# Patient Record
Sex: Female | Born: 1935 | Race: White | Hispanic: No | Marital: Married | Smoking: Never smoker
Health system: Southern US, Community
[De-identification: ages and names within clinical notes are randomized; demographics above are authoritative.]

## PROBLEM LIST (undated history)

## (undated) DIAGNOSIS — G35 Multiple sclerosis: Secondary | ICD-10-CM

## (undated) DIAGNOSIS — I10 Essential (primary) hypertension: Secondary | ICD-10-CM

## (undated) DIAGNOSIS — K4090 Unilateral inguinal hernia, without obstruction or gangrene, not specified as recurrent: Secondary | ICD-10-CM

## (undated) DIAGNOSIS — K5792 Diverticulitis of intestine, part unspecified, without perforation or abscess without bleeding: Secondary | ICD-10-CM

## (undated) DIAGNOSIS — E119 Type 2 diabetes mellitus without complications: Secondary | ICD-10-CM

## (undated) HISTORY — PX: ABDOMINAL HYSTERECTOMY: SHX81

## (undated) HISTORY — PX: CHOLECYSTECTOMY: SHX55

## (undated) HISTORY — PX: CERVICAL SPINE SURGERY: SHX589

## (undated) HISTORY — PX: REPLACEMENT TOTAL KNEE: SUR1224

## (undated) HISTORY — PX: HERNIA REPAIR: SHX51

## (undated) HISTORY — PX: APPENDECTOMY: SHX54

## (undated) HISTORY — PX: ORIF ANKLE FRACTURE: SUR919

---

## 1997-05-03 ENCOUNTER — Ambulatory Visit (HOSPITAL_COMMUNITY): Admission: RE | Admit: 1997-05-03 | Discharge: 1997-05-03 | Payer: Self-pay | Admitting: *Deleted

## 1998-06-07 ENCOUNTER — Ambulatory Visit (HOSPITAL_COMMUNITY): Admission: RE | Admit: 1998-06-07 | Discharge: 1998-06-07 | Payer: Self-pay | Admitting: *Deleted

## 1998-06-07 ENCOUNTER — Encounter: Payer: Self-pay | Admitting: Internal Medicine

## 1999-08-23 ENCOUNTER — Ambulatory Visit (HOSPITAL_COMMUNITY): Admission: RE | Admit: 1999-08-23 | Discharge: 1999-08-23 | Payer: Self-pay | Admitting: *Deleted

## 1999-08-23 ENCOUNTER — Encounter: Payer: Self-pay | Admitting: Internal Medicine

## 2000-02-11 ENCOUNTER — Inpatient Hospital Stay (HOSPITAL_COMMUNITY)
Admission: RE | Admit: 2000-02-11 | Discharge: 2000-02-28 | Payer: Self-pay | Admitting: Physical Medicine and Rehabilitation

## 2000-02-26 ENCOUNTER — Encounter: Payer: Self-pay | Admitting: Physical Medicine and Rehabilitation

## 2000-09-02 ENCOUNTER — Encounter: Payer: Self-pay | Admitting: Internal Medicine

## 2000-09-02 ENCOUNTER — Ambulatory Visit (HOSPITAL_COMMUNITY): Admission: RE | Admit: 2000-09-02 | Discharge: 2000-09-02 | Payer: Self-pay | Admitting: *Deleted

## 2002-05-17 ENCOUNTER — Ambulatory Visit (HOSPITAL_COMMUNITY): Admission: RE | Admit: 2002-05-17 | Discharge: 2002-05-17 | Payer: Self-pay | Admitting: Internal Medicine

## 2002-05-17 ENCOUNTER — Encounter: Payer: Self-pay | Admitting: Internal Medicine

## 2003-09-14 ENCOUNTER — Emergency Department (HOSPITAL_COMMUNITY): Admission: EM | Admit: 2003-09-14 | Discharge: 2003-09-14 | Payer: Self-pay | Admitting: *Deleted

## 2003-09-14 ENCOUNTER — Ambulatory Visit (HOSPITAL_COMMUNITY): Admission: RE | Admit: 2003-09-14 | Discharge: 2003-09-14 | Payer: Self-pay | Admitting: Internal Medicine

## 2004-10-08 ENCOUNTER — Ambulatory Visit: Payer: Self-pay | Admitting: Internal Medicine

## 2005-10-10 ENCOUNTER — Ambulatory Visit: Payer: Self-pay | Admitting: Internal Medicine

## 2006-04-11 ENCOUNTER — Inpatient Hospital Stay: Payer: Self-pay | Admitting: General Practice

## 2006-04-11 ENCOUNTER — Other Ambulatory Visit: Payer: Self-pay

## 2006-04-15 ENCOUNTER — Encounter: Payer: Self-pay | Admitting: Internal Medicine

## 2006-05-15 ENCOUNTER — Encounter: Payer: Self-pay | Admitting: Internal Medicine

## 2006-06-12 ENCOUNTER — Ambulatory Visit: Payer: Self-pay | Admitting: Nurse Practitioner

## 2006-10-13 ENCOUNTER — Ambulatory Visit: Payer: Self-pay | Admitting: Family Medicine

## 2006-11-13 ENCOUNTER — Encounter: Payer: Self-pay | Admitting: Internal Medicine

## 2006-11-15 ENCOUNTER — Encounter: Payer: Self-pay | Admitting: Internal Medicine

## 2006-12-16 ENCOUNTER — Ambulatory Visit: Payer: Self-pay | Admitting: Unknown Physician Specialty

## 2007-06-18 ENCOUNTER — Ambulatory Visit: Payer: Self-pay | Admitting: Internal Medicine

## 2008-09-26 ENCOUNTER — Ambulatory Visit: Payer: Self-pay | Admitting: Unknown Physician Specialty

## 2008-10-06 ENCOUNTER — Ambulatory Visit: Payer: Self-pay | Admitting: Family Medicine

## 2009-05-10 ENCOUNTER — Ambulatory Visit: Payer: Self-pay | Admitting: Ophthalmology

## 2009-09-06 ENCOUNTER — Ambulatory Visit: Payer: Self-pay | Admitting: Ophthalmology

## 2009-09-20 ENCOUNTER — Ambulatory Visit: Payer: Self-pay | Admitting: Internal Medicine

## 2009-10-04 ENCOUNTER — Ambulatory Visit: Payer: Self-pay | Admitting: Internal Medicine

## 2009-10-18 ENCOUNTER — Ambulatory Visit: Payer: Self-pay | Admitting: Internal Medicine

## 2009-10-27 ENCOUNTER — Ambulatory Visit: Payer: Self-pay | Admitting: Internal Medicine

## 2009-10-31 ENCOUNTER — Ambulatory Visit: Payer: Self-pay | Admitting: Internal Medicine

## 2009-11-07 ENCOUNTER — Ambulatory Visit: Payer: Self-pay | Admitting: Internal Medicine

## 2009-11-14 ENCOUNTER — Ambulatory Visit: Payer: Self-pay | Admitting: Internal Medicine

## 2009-11-23 ENCOUNTER — Ambulatory Visit: Payer: Self-pay | Admitting: Internal Medicine

## 2010-02-06 ENCOUNTER — Ambulatory Visit: Payer: Self-pay | Admitting: Family Medicine

## 2010-04-06 ENCOUNTER — Ambulatory Visit: Payer: Self-pay | Admitting: Unknown Physician Specialty

## 2010-05-07 ENCOUNTER — Ambulatory Visit: Payer: Self-pay | Admitting: Unknown Physician Specialty

## 2010-06-04 ENCOUNTER — Ambulatory Visit: Payer: Self-pay | Admitting: Otolaryngology

## 2010-08-13 ENCOUNTER — Ambulatory Visit: Payer: Self-pay | Admitting: Otolaryngology

## 2010-09-26 ENCOUNTER — Ambulatory Visit: Payer: Self-pay | Admitting: Family Medicine

## 2010-11-14 ENCOUNTER — Ambulatory Visit: Payer: Self-pay

## 2013-09-16 ENCOUNTER — Ambulatory Visit: Payer: Self-pay | Admitting: Family Medicine

## 2013-09-29 ENCOUNTER — Ambulatory Visit: Payer: Self-pay | Admitting: Family Medicine

## 2014-02-24 ENCOUNTER — Ambulatory Visit: Payer: Self-pay | Admitting: Emergency Medicine

## 2014-07-01 ENCOUNTER — Encounter: Payer: Self-pay | Admitting: Emergency Medicine

## 2014-07-01 ENCOUNTER — Emergency Department: Payer: Medicare Other

## 2014-07-01 ENCOUNTER — Inpatient Hospital Stay
Admission: EM | Admit: 2014-07-01 | Discharge: 2014-07-04 | DRG: 640 | Disposition: A | Discharge status: Home / Self Care | Payer: Medicare Other | Attending: Internal Medicine | Admitting: Internal Medicine

## 2014-07-01 DIAGNOSIS — I10 Essential (primary) hypertension: Secondary | ICD-10-CM | POA: Diagnosis present

## 2014-07-01 DIAGNOSIS — Z7982 Long term (current) use of aspirin: Secondary | ICD-10-CM | POA: Diagnosis not present

## 2014-07-01 DIAGNOSIS — Z96652 Presence of left artificial knee joint: Secondary | ICD-10-CM | POA: Diagnosis present

## 2014-07-01 DIAGNOSIS — D649 Anemia, unspecified: Secondary | ICD-10-CM | POA: Diagnosis present

## 2014-07-01 DIAGNOSIS — R270 Ataxia, unspecified: Secondary | ICD-10-CM

## 2014-07-01 DIAGNOSIS — G35 Multiple sclerosis: Secondary | ICD-10-CM | POA: Diagnosis present

## 2014-07-01 DIAGNOSIS — E1140 Type 2 diabetes mellitus with diabetic neuropathy, unspecified: Secondary | ICD-10-CM | POA: Diagnosis present

## 2014-07-01 DIAGNOSIS — G894 Chronic pain syndrome: Secondary | ICD-10-CM | POA: Diagnosis present

## 2014-07-01 DIAGNOSIS — E876 Hypokalemia: Secondary | ICD-10-CM | POA: Diagnosis present

## 2014-07-01 DIAGNOSIS — E871 Hypo-osmolality and hyponatremia: Principal | ICD-10-CM | POA: Diagnosis present

## 2014-07-01 DIAGNOSIS — R531 Weakness: Secondary | ICD-10-CM

## 2014-07-01 DIAGNOSIS — K219 Gastro-esophageal reflux disease without esophagitis: Secondary | ICD-10-CM | POA: Diagnosis present

## 2014-07-01 DIAGNOSIS — G9341 Metabolic encephalopathy: Secondary | ICD-10-CM | POA: Diagnosis present

## 2014-07-01 HISTORY — DX: Essential (primary) hypertension: I10

## 2014-07-01 HISTORY — DX: Multiple sclerosis: G35

## 2014-07-01 HISTORY — DX: Unilateral inguinal hernia, without obstruction or gangrene, not specified as recurrent: K40.90

## 2014-07-01 HISTORY — DX: Type 2 diabetes mellitus without complications: E11.9

## 2014-07-01 HISTORY — DX: Diverticulitis of intestine, part unspecified, without perforation or abscess without bleeding: K57.92

## 2014-07-01 LAB — CBC
HEMATOCRIT: 29.4 % — AB (ref 35.0–47.0)
Hemoglobin: 10.2 g/dL — ABNORMAL LOW (ref 12.0–16.0)
MCH: 33.3 pg (ref 26.0–34.0)
MCHC: 34.7 g/dL (ref 32.0–36.0)
MCV: 96.1 fL (ref 80.0–100.0)
Platelets: 259 10*3/uL (ref 150–440)
RBC: 3.06 MIL/uL — ABNORMAL LOW (ref 3.80–5.20)
RDW: 15.2 % — AB (ref 11.5–14.5)
WBC: 4.5 10*3/uL (ref 3.6–11.0)

## 2014-07-01 LAB — COMPREHENSIVE METABOLIC PANEL
ALT: 23 U/L (ref 14–54)
AST: 36 U/L (ref 15–41)
Albumin: 3.2 g/dL — ABNORMAL LOW (ref 3.5–5.0)
Alkaline Phosphatase: 76 U/L (ref 38–126)
Anion gap: 11 (ref 5–15)
BILIRUBIN TOTAL: 0.2 mg/dL — AB (ref 0.3–1.2)
BUN: 12 mg/dL (ref 6–20)
CALCIUM: 8.8 mg/dL — AB (ref 8.9–10.3)
CO2: 26 mmol/L (ref 22–32)
Chloride: 85 mmol/L — ABNORMAL LOW (ref 101–111)
Creatinine, Ser: 0.85 mg/dL (ref 0.44–1.00)
GLUCOSE: 135 mg/dL — AB (ref 65–99)
Potassium: 3.4 mmol/L — ABNORMAL LOW (ref 3.5–5.1)
SODIUM: 122 mmol/L — AB (ref 135–145)
Total Protein: 8.6 g/dL — ABNORMAL HIGH (ref 6.5–8.1)

## 2014-07-01 LAB — URINALYSIS COMPLETE WITH MICROSCOPIC (ARMC ONLY)
BACTERIA UA: NONE SEEN
Bilirubin Urine: NEGATIVE
GLUCOSE, UA: NEGATIVE mg/dL
Hgb urine dipstick: NEGATIVE
Ketones, ur: NEGATIVE mg/dL
Leukocytes, UA: NEGATIVE
Nitrite: NEGATIVE
Protein, ur: NEGATIVE mg/dL
SPECIFIC GRAVITY, URINE: 1.011 (ref 1.005–1.030)
pH: 7 (ref 5.0–8.0)

## 2014-07-01 LAB — GLUCOSE, CAPILLARY: Glucose-Capillary: 92 mg/dL (ref 65–99)

## 2014-07-01 MED ORDER — FLUTICASONE PROPIONATE 50 MCG/ACT NA SUSP
1.0000 | Freq: Every day | NASAL | Status: DC
  Filled 2014-07-01: qty 16

## 2014-07-01 MED ORDER — FLUTICASONE PROPIONATE 50 MCG/ACT NA SUSP
1.0000 | Freq: Every day | NASAL | Status: DC
  Administered 2014-07-01 – 2014-07-04 (×4): 1 via NASAL
  Filled 2014-07-01: qty 16

## 2014-07-01 MED ORDER — VITAMIN D 1000 UNITS PO TABS
4000.0000 [IU] | ORAL_TABLET | Freq: Every day | ORAL | Status: DC
  Administered 2014-07-01 – 2014-07-02 (×2): 4000 [IU] via ORAL
  Filled 2014-07-01 (×3): qty 4

## 2014-07-01 MED ORDER — DONEPEZIL HCL 5 MG PO TABS
10.0000 mg | ORAL_TABLET | Freq: Every day | ORAL | Status: DC
  Administered 2014-07-01 – 2014-07-03 (×3): 10 mg via ORAL
  Filled 2014-07-01 (×3): qty 2

## 2014-07-01 MED ORDER — BENAZEPRIL HCL 20 MG PO TABS
40.0000 mg | ORAL_TABLET | Freq: Every day | ORAL | Status: DC
  Administered 2014-07-02 – 2014-07-04 (×3): 40 mg via ORAL
  Filled 2014-07-01 (×2): qty 1
  Filled 2014-07-01: qty 2
  Filled 2014-07-01 (×2): qty 1
  Filled 2014-07-01: qty 2

## 2014-07-01 MED ORDER — METHADONE HCL 10 MG PO TABS
5.0000 mg | ORAL_TABLET | Freq: Four times a day (QID) | ORAL | Status: DC
  Administered 2014-07-01 – 2014-07-03 (×8): 5 mg via ORAL
  Filled 2014-07-01 (×8): qty 1

## 2014-07-01 MED ORDER — ENOXAPARIN SODIUM 40 MG/0.4ML ~~LOC~~ SOLN
40.0000 mg | SUBCUTANEOUS | Status: DC
  Administered 2014-07-02 – 2014-07-03 (×2): 40 mg via SUBCUTANEOUS
  Filled 2014-07-01 (×2): qty 0.4

## 2014-07-01 MED ORDER — TRAMADOL HCL 50 MG PO TABS
100.0000 mg | ORAL_TABLET | Freq: Two times a day (BID) | ORAL | Status: DC
  Administered 2014-07-01 – 2014-07-04 (×6): 100 mg via ORAL
  Filled 2014-07-01 (×6): qty 2

## 2014-07-01 MED ORDER — DICYCLOMINE HCL 10 MG PO CAPS
10.0000 mg | ORAL_CAPSULE | Freq: Two times a day (BID) | ORAL | Status: DC
  Administered 2014-07-01 – 2014-07-04 (×6): 10 mg via ORAL
  Filled 2014-07-01 (×6): qty 1

## 2014-07-01 MED ORDER — SODIUM CHLORIDE 0.9 % IJ SOLN
3.0000 mL | Freq: Two times a day (BID) | INTRAMUSCULAR | Status: DC
  Administered 2014-07-01: 3 mL via INTRAVENOUS

## 2014-07-01 MED ORDER — OXCARBAZEPINE 300 MG PO TABS
300.0000 mg | ORAL_TABLET | Freq: Two times a day (BID) | ORAL | Status: DC
  Administered 2014-07-01 – 2014-07-04 (×6): 300 mg via ORAL
  Filled 2014-07-01 (×7): qty 1

## 2014-07-01 MED ORDER — RISAQUAD PO CAPS
1.0000 | ORAL_CAPSULE | Freq: Every day | ORAL | Status: DC
  Administered 2014-07-02 – 2014-07-04 (×3): 1 via ORAL
  Filled 2014-07-01 (×4): qty 1

## 2014-07-01 MED ORDER — PANTOPRAZOLE SODIUM 40 MG PO TBEC
40.0000 mg | DELAYED_RELEASE_TABLET | Freq: Every day | ORAL | Status: DC
  Administered 2014-07-01 – 2014-07-04 (×4): 40 mg via ORAL
  Filled 2014-07-01 (×4): qty 1

## 2014-07-01 MED ORDER — INSULIN ASPART 100 UNIT/ML ~~LOC~~ SOLN
0.0000 [IU] | Freq: Three times a day (TID) | SUBCUTANEOUS | Status: DC
  Administered 2014-07-02: 2 [IU] via SUBCUTANEOUS
  Administered 2014-07-02: 1 [IU] via SUBCUTANEOUS
  Filled 2014-07-01: qty 2
  Filled 2014-07-01: qty 1
  Filled 2014-07-01: qty 2

## 2014-07-01 MED ORDER — SODIUM CHLORIDE 0.9 % IV BOLUS (SEPSIS)
1000.0000 mL | Freq: Once | INTRAVENOUS | Status: AC
  Administered 2014-07-01: 1000 mL via INTRAVENOUS

## 2014-07-01 MED ORDER — ACETAMINOPHEN 325 MG PO TABS
650.0000 mg | ORAL_TABLET | Freq: Four times a day (QID) | ORAL | Status: DC | PRN
  Administered 2014-07-03: 650 mg via ORAL
  Filled 2014-07-01: qty 2

## 2014-07-01 MED ORDER — DOCUSATE SODIUM 100 MG PO CAPS
100.0000 mg | ORAL_CAPSULE | Freq: Two times a day (BID) | ORAL | Status: DC | PRN
  Administered 2014-07-03: 200 mg via ORAL
  Filled 2014-07-01: qty 2

## 2014-07-01 MED ORDER — PRIMIDONE 50 MG PO TABS
50.0000 mg | ORAL_TABLET | Freq: Three times a day (TID) | ORAL | Status: DC | PRN
  Filled 2014-07-01: qty 1

## 2014-07-01 MED ORDER — VITAMIN C 500 MG PO TABS
500.0000 mg | ORAL_TABLET | Freq: Every day | ORAL | Status: DC
  Administered 2014-07-01 – 2014-07-03 (×3): 500 mg via ORAL
  Filled 2014-07-01 (×3): qty 1

## 2014-07-01 MED ORDER — GABAPENTIN 300 MG PO CAPS
300.0000 mg | ORAL_CAPSULE | Freq: Every day | ORAL | Status: DC
  Administered 2014-07-01 – 2014-07-03 (×7): 300 mg via ORAL
  Filled 2014-07-01 (×7): qty 1

## 2014-07-01 MED ORDER — TRAMADOL HCL 50 MG PO TABS: 200.0000 mg | ORAL_TABLET | Freq: Two times a day (BID) | ORAL | Status: DC

## 2014-07-01 MED ORDER — DIPHENHYDRAMINE HCL 25 MG PO CAPS
50.0000 mg | ORAL_CAPSULE | Freq: Every evening | ORAL | Status: DC | PRN
  Administered 2014-07-01 – 2014-07-03 (×3): 50 mg via ORAL
  Filled 2014-07-01 (×3): qty 2

## 2014-07-01 MED ORDER — SODIUM CHLORIDE 0.9 % IV SOLN
INTRAVENOUS | Status: DC
  Administered 2014-07-01 – 2014-07-04 (×5): via INTRAVENOUS

## 2014-07-01 MED ORDER — GLIMEPIRIDE 2 MG PO TABS
1.0000 mg | ORAL_TABLET | Freq: Every day | ORAL | Status: DC
  Administered 2014-07-02 – 2014-07-03 (×2): 1 mg via ORAL
  Administered 2014-07-04: 2 mg via ORAL
  Filled 2014-07-01 (×3): qty 1

## 2014-07-01 MED ORDER — ASPIRIN EC 81 MG PO TBEC
81.0000 mg | DELAYED_RELEASE_TABLET | Freq: Every day | ORAL | Status: DC
  Administered 2014-07-01 – 2014-07-03 (×3): 81 mg via ORAL
  Filled 2014-07-01 (×3): qty 1

## 2014-07-01 MED ORDER — ACETAMINOPHEN 650 MG RE SUPP: 650.0000 mg | Freq: Four times a day (QID) | RECTAL | Status: DC | PRN

## 2014-07-01 MED ORDER — CALCIUM CARBONATE ANTACID 500 MG PO CHEW
500.0000 mg | CHEWABLE_TABLET | Freq: Every day | ORAL | Status: DC
  Administered 2014-07-01 – 2014-07-03 (×3): 500 mg via ORAL
  Filled 2014-07-01 (×6): qty 1

## 2014-07-01 NOTE — ED Provider Notes (Signed)
[facility] Emergency Department Provider Note  ____________________________________________  Time seen: 4:25 PM  I have reviewed the triage vital signs and the nursing notes.   HISTORY  Chief Complaint Altered Mental Status  history obtained from patient and family at bedside   HPI Stefanie Pham is a 81 y.o. female is complaining of confusion and generalized weakness with difficulty walking and unsteadiness the past 24 hours. This is gradual in onset. The patient denies headache and change chest pain shortness of breath abdominal pain nausea vomiting or diarrhea. She was recently treated for UTI with Keflex which was then switched to Macrobid. No fevers or chills or other acute symptoms     Past Medical History  Diagnosis Date  . Hypertension   . Multiple sclerosis   . Diabetes mellitus without complication   . Hernia, inguinal   . Diverticulitis     There are no active problems to display for this patient.   Past Surgical History  Procedure Laterality Date  . Cholecystectomy    . Appendectomy      Current Outpatient Rx  Name  Route  Sig  Dispense  Refill  . benazepril (LOTENSIN) 40 MG tablet   Oral   Take 40 mg by mouth daily.         Marland Kitchen dicyclomine (BENTYL) 10 MG capsule   Oral   Take 10 mg by mouth 2 (two) times daily.         Marland Kitchen donepezil (ARICEPT) 10 MG tablet   Oral   Take 10 mg by mouth at bedtime.         . gabapentin (NEURONTIN) 300 MG capsule   Oral   Take 300 mg by mouth 5 (five) times daily.         Marland Kitchen glimepiride (AMARYL) 1 MG tablet   Oral   Take 1 mg by mouth daily. If fasting blood sugar is over 120 then pt takes two tablets.         . hydrochlorothiazide (HYDRODIURIL) 25 MG tablet   Oral   Take 25 mg by mouth daily.         . methadone (DOLOPHINE) 5 MG tablet   Oral   Take 5 mg by mouth every 6 (six) hours.         Marland Kitchen omeprazole (PRILOSEC) 20 MG capsule   Oral   Take 20 mg by mouth daily.          . Oxcarbazepine (TRILEPTAL) 300 MG tablet   Oral   Take 300 mg by mouth 2 (two) times daily.         . primidone (MYSOLINE) 50 MG tablet   Oral   Take 50 mg by mouth 3 (three) times daily as needed (for tremors).         . Probiotic CAPS   Oral   Take 1 capsule by mouth daily.         . traMADol (ULTRAM-ER) 100 MG 24 hr tablet   Oral   Take 100 mg by mouth 2 (two) times daily.           Allergies Morphine and related and Codeine  History reviewed. No pertinent family history.  Social History History  Substance Use Topics  . Smoking status: Never Smoker   . Smokeless tobacco: Not on file  . Alcohol Use: No    Review of Systems  Constitutional: No fever or chills. No weight changes Eyes:No blurry vision or double vision.  ENT:  No sore throat. Cardiovascular: No chest pain. Respiratory: No dyspnea or cough. Gastrointestinal: Negative for abdominal pain, vomiting and diarrhea.  No BRBPR or melena. Genitourinary: Negative for dysuria, urinary retention, bloody urine, or difficulty urinating. Musculoskeletal: Negative for back pain. No joint swelling or pain. Skin: Negative for rash. Neurological: Diminished balance and coordination, generalized weakness. Psychiatric:No anxiety or depression.   Endocrine:No hot/cold intolerance, changes in energy, or sleep difficulty.  10-point ROS otherwise negative.  ____________________________________________   PHYSICAL EXAM:  VITAL SIGNS: ED Triage Vitals  Enc Vitals Group     BP 07/01/14 1335 136/66 mmHg     Pulse Rate 07/01/14 1335 68     Resp 07/01/14 1335 18     Temp 07/01/14 1335 98.8 F (37.1 C)     Temp Source 07/01/14 1335 Oral     SpO2 07/01/14 1335 99 %     Weight 07/01/14 1335 220 lb (99.791 kg)     Height 07/01/14 1335 5\' 7"  (1.702 m)     Head Cir --      Peak Flow --      Pain Score --      Pain Loc --      Pain Edu? --      Excl. in GC? --      Constitutional: Alert and oriented  to self and place. Well appearing and in no distress. Eyes: No scleral icterus. No conjunctival pallor. PERRL. EOMI ENT   Head: Normocephalic and atraumatic.   Nose: No congestion/rhinnorhea. No septal hematoma   Mouth/Throat: MMM, no pharyngeal erythema. No peritonsillar mass. No uvula shift.   Neck: No stridor. No SubQ emphysema. No meningismus. Hematological/Lymphatic/Immunilogical: No cervical lymphadenopathy. Cardiovascular: RRR. Normal and symmetric distal pulses are present in all extremities. No murmurs, rubs, or gallops. Respiratory: Normal respiratory effort without tachypnea nor retractions. Breath sounds are clear and equal bilaterally. No wheezes/rales/rhonchi. Gastrointestinal: Soft and nontender. No distention. There is no CVA tenderness.  No rebound, rigidity, or guarding. Genitourinary: deferred Musculoskeletal: Nontender with normal range of motion in all extremities. No joint effusions.  No lower extremity tenderness.  No edema. Neurologic:   Normal speech and language.  CN 2-10 normal. Profound weakness on standing, severely unsteady and ataxic with attempted ambulation.. . Patient is able to complete finger-to-nose test with contact to her nose in my finger, but has wandering path When attempting to connect from her nose to my finger bilaterally .  Skin:  Skin is warm, dry and intact. No rash noted.  No petechiae, purpura, or bullae. Psychiatric: Mood and affect are normal. Speech and behavior are normal. Patient exhibits appropriate insight and judgment.  ____________________________________________    LABS (pertinent positives/negatives) (all labs ordered are listed, but only abnormal results are displayed) Labs Reviewed  COMPREHENSIVE METABOLIC PANEL - Abnormal; Notable for the following:    Sodium 122 (*)    Potassium 3.4 (*)    Chloride 85 (*)    Glucose, Bld 135 (*)    Calcium 8.8 (*)    Total Protein 8.6 (*)    Albumin 3.2 (*)    Total  Bilirubin 0.2 (*)    All other components within normal limits  CBC - Abnormal; Notable for the following:    RBC 3.06 (*)    Hemoglobin 10.2 (*)    HCT 29.4 (*)    RDW 15.2 (*)    All other components within normal limits  URINALYSIS COMPLETEWITH MICROSCOPIC ([facility] ONLY) - Abnormal; Notable for the following:  Color, Urine YELLOW (*)    APPearance CLEAR (*)    Squamous Epithelial / LPF 0-5 (*)    All other components within normal limits   ____________________________________________   EKG    ____________________________________________    RADIOLOGY  CT head unremarkable  ____________________________________________   PROCEDURES CRITICAL CARE Performed by: Scotty Court, Lakyla Biswas   Total critical care time: 35 minutes  Critical care time was exclusive of separately billable procedures and treating other patients.  Critical care was necessary to treat or prevent imminent or life-threatening deterioration.  Critical care was time spent personally by me on the following activities: development of treatment plan with patient and/or surrogate as well as nursing, discussions with consultants, evaluation of patient's response to treatment, examination of patient, obtaining history from patient or surrogate, ordering and performing treatments and interventions, ordering and review of laboratory studies, ordering and review of radiographic studies, pulse oximetry and re-evaluation of patient's condition.  ____________________________________________   INITIAL IMPRESSION / ASSESSMENT AND PLAN / ED COURSE  Pertinent labs & imaging results that were available during my care of the patient were reviewed by me and considered in my medical decision making (see chart for details).  Patient presents with hyponatremia with neurologic symptoms. We will start a saline bolus and CT her head to evaluate for cerebral edema. A plan to admit to hospitalist services for further  management. ----------------------------------------- 5:40 PM on 07/01/2014 -----------------------------------------  She remains hemodynamically stable. Neurologic status is unchanged. CT is unremarkable. Continue IV fluids. I discussed the case with Dr. Hilton Sinclair of hospitalist services for admission.   ____________________________________________   FINAL CLINICAL IMPRESSION(S) / ED DIAGNOSES  Final diagnoses:  Acute hyponatremia  Generalized weakness  Ataxia      Sharman Cheek, MD 07/01/14 1740

## 2014-07-01 NOTE — H&P (Signed)
[facility] - [place] at [facility]   PATIENT NAME: Stefanie Pham    MR#:  [id number]  DATE OF BIRTH:  January 26, 1933  DATE OF ADMISSION:  07/01/2014  PRIMARY CARE PHYSICIAN: [facility] primary care Dr. Alda Ponder  REQUESTING/REFERRING PHYSICIAN: Dr. Scotty Court  CHIEF COMPLAINT:   Chief Complaint  Patient presents with  . Altered Mental Status    HISTORY OF PRESENT ILLNESS:  Stefanie Pham  is a 81 y.o. female with a known history of multiple sclerosis and hypertension and chronic pain. For over a week now she has been out of her head she is not and responding well. She recently finished treatment for urinary tract infection and had a course of Keflex and then Macrobid. She's had no balance and now unable to walk. She's been incoherent at times which comes and goes. Yesterday she sleept all day. She was brought into the ER for further evaluation and was found to have a very low sodium. Hospitalist services were contacted for further evaluation. Patient currently not the best historian history obtained from family at the bedside.  PAST MEDICAL HISTORY:   Past Medical History  Diagnosis Date  . Hypertension   . Multiple sclerosis   . Diabetes mellitus without complication   . Hernia, inguinal   . Diverticulitis     PAST SURGICAL HISTORY:   Past Surgical History  Procedure Laterality Date  . Cholecystectomy    . Appendectomy    . Replacement total knee Left 2  . Orif ankle fracture Left   . Abdominal hysterectomy    . Hernia repair    . Cervical spine surgery N/A     SOCIAL HISTORY:   History  Substance Use Topics  . Smoking status: Never Smoker   . Smokeless tobacco: Not on file  . Alcohol Use: No    FAMILY HISTORY:   Family History  Problem Relation Age of Onset  . COPD Father     DRUG ALLERGIES:   Allergies  Allergen Reactions  . Morphine And Related Shortness Of Breath  . Codeine Nausea And Vomiting    REVIEW OF SYSTEMS:   CONSTITUTIONAL: No fever, positive for weakness. Positive for cold feeling EYES: No blurred or double vision. She wears glasses EARS, NOSE, AND THROAT: No tinnitus or ear pain. No sore throat. Positive for dysphagia. RESPIRATORY: No cough, shortness of breath, wheezing or hemoptysis.  CARDIOVASCULAR: No chest pain, orthopnea, edema.  GASTROINTESTINAL: No nausea, vomiting, diarrhea or abdominal pain. No blood in bowel movements GENITOURINARY: No dysuria, hematuria. Recent urinary tract infection ENDOCRINE: No polyuria, nocturia,  HEMATOLOGY: No anemia, easy bruising or bleeding SKIN: No rash or lesion. Dry skin with itching. MUSCULOSKELETAL: Joint pain on her knees  NEUROLOGIC: No tingling, numbness, weakness.  PSYCHIATRY: No anxiety or depression.   MEDICATIONS AT HOME:   Prior to Admission medications   Medication Sig Start Date End Date Taking? Authorizing Provider  aspirin EC 81 MG tablet Take 81 mg by mouth at bedtime.   Yes Historical Provider, MD  benazepril (LOTENSIN) 40 MG tablet Take 40 mg by mouth daily.   Yes Historical Provider, MD  Bioflavonoid Products (VITAMIN C) CHEW Chew 1 each by mouth at bedtime.   Yes Historical Provider, MD  CALCIUM PO Take 1 each by mouth at bedtime.   Yes Historical Provider, MD  Cholecalciferol (HM VITAMIN D3) 4000 UNITS CAPS Take 1 capsule by mouth at bedtime.   Yes Historical Provider, MD  dicyclomine (BENTYL) 10 MG capsule Take  10 mg by mouth 2 (two) times daily.   Yes Historical Provider, MD  diphenhydrAMINE (BENADRYL) 25 mg capsule Take 50 mg by mouth at bedtime as needed for sleep.   Yes Historical Provider, MD  docusate sodium (COLACE) 100 MG capsule Take 100-200 mg by mouth 2 (two) times daily as needed for mild constipation.   Yes Historical Provider, MD  donepezil (ARICEPT) 10 MG tablet Take 10 mg by mouth at bedtime.   Yes Historical Provider, MD  gabapentin (NEURONTIN) 300 MG capsule Take 300 mg by mouth 5 (five) times daily.   Yes  Historical Provider, MD  glimepiride (AMARYL) 1 MG tablet Take 1 mg by mouth daily. If fasting blood sugar is over 120 then pt takes two tablets.   Yes Historical Provider, MD  hydrochlorothiazide (HYDRODIURIL) 25 MG tablet Take 25 mg by mouth daily.   Yes Historical Provider, MD  KRILL OIL PO Take 1 capsule by mouth at bedtime.   Yes Historical Provider, MD  methadone (DOLOPHINE) 5 MG tablet Take 5 mg by mouth every 6 (six) hours.   Yes Historical Provider, MD  mometasone (NASONEX) 50 MCG/ACT nasal spray Place 2 sprays into the nose at bedtime.   Yes Historical Provider, MD  Multiple Vitamins-Minerals (MULTIVITAMIN PO) Take 2 each by mouth at bedtime.   Yes Historical Provider, MD  omeprazole (PRILOSEC) 20 MG capsule Take 20 mg by mouth daily.   Yes Historical Provider, MD  Oxcarbazepine (TRILEPTAL) 300 MG tablet Take 300 mg by mouth 2 (two) times daily.   Yes Historical Provider, MD  POTASSIUM PO Take 1 tablet by mouth at bedtime.   Yes Historical Provider, MD  primidone (MYSOLINE) 50 MG tablet Take 50 mg by mouth 3 (three) times daily as needed (for tremors).   Yes Historical Provider, MD  Probiotic CAPS Take 1 capsule by mouth daily.   Yes Historical Provider, MD  Pyridoxine HCl (VITAMIN B-6 PO) Take 1 tablet by mouth at bedtime.   Yes Historical Provider, MD  traMADol (ULTRAM-ER) 100 MG 24 hr tablet Take 100 mg by mouth 2 (two) times daily.   Yes Historical Provider, MD      VITAL SIGNS:  Blood pressure 163/74, pulse 61, temperature 98.8 F (37.1 C), temperature source Oral, resp. rate 18, height  (1.702 m), weight 99.791 kg (220 lb), SpO2 100 %.  PHYSICAL EXAMINATION:  GENERAL:  81 y.o.-year-old patient lying in the bed with no acute distress.  EYES: Pupils equal, round, reactive to light and accommodation. No scleral icterus. Extraocular muscles intact.  HEENT: Head atraumatic, normocephalic. Oropharynx and nasopharynx clear.  NECK:  Supple, no jugular venous distention. No  thyroid enlargement, no tenderness.  LUNGS: Normal breath sounds bilaterally, no wheezing, rales,rhonchi or crepitation. No use of accessory muscles of respiration.  CARDIOVASCULAR: S1, S2 normal. No murmurs, rubs, or gallops.  ABDOMEN: Soft, nontender, nondistended. Bowel sounds present. No organomegaly or mass.  EXTREMITIES: 2+ edema, no cyanosis, or clubbing.  NEUROLOGIC: Cranial nerves II through XII are intact. Muscle strength 5/5 in all extremities. Sensation intact. Gait not checked. Patient able to straight leg raise bilaterally PSYCHIATRIC: The patient is alert and answers some yes or no questions appropriately  SKIN: Chronic lower extremity discoloration  LABORATORY PANEL:   CBC  Recent Labs Lab 07/01/14 1347  WBC 4.5  HGB 10.2*  HCT 29.4*  PLT 259   Chemistries   Recent Labs Lab 07/01/14 1347  NA 122*  K 3.4*  CL 85*  CO2 26  GLUCOSE 135*  BUN 12  CREATININE 0.85  CALCIUM 8.8*  AST 36  ALT 23  ALKPHOS 76  BILITOT 0.2*    RADIOLOGY:  Ct Head Wo Contrast  07/01/2014   CLINICAL DATA:  Altered mental status, disorientation, recent UTI, history hypertension, diabetes mellitus, multiple sclerosis  EXAM: CT HEAD WITHOUT CONTRAST  TECHNIQUE: Contiguous axial images were obtained from the base of the skull through the vertex without intravenous contrast.  COMPARISON:  02/24/2014  FINDINGS: Streak artifacts at skull base.  Generalized atrophy.  Normal ventricular morphology.  No midline shift or mass effect.  Small vessel chronic ischemic changes of deep cerebral white matter.  No intracranial hemorrhage, mass lesion or evidence acute infarction.  No extra-axial fluid collections.  Sinuses clear bones unremarkable.  IMPRESSION: Atrophy with small vessel chronic ischemic changes of deep cerebral white matter.  No acute intracranial abnormalities.   Electronically Signed   By: Ulyses Southward M.D.   On: 07/01/2014 17:03   IMPRESSION AND PLAN:   1. Hyponatremia, acute  metabolic encephalopathy. I will give IV fluid hydration with normal saline and continue to monitor serial sodiums. I think the culprit is probably hydrochlorothiazide this medication will be stopped. I will send off a urine osmolarity and a urine random sodium. I will put on regular diet. Physical therapy consultation ordered for the patient's weakness. 2. Multiple sclerosis I do not think she has an exacerbation. Continue her usual medications. 3. Chronic pain syndrome on methadone, gabapentin. 4. Essential hypertension- blood pressure slightly elevated, monitor off hydrochlorothiazide, continue benazepril. 5. Gastroesophageal reflux disease without esophagitis continue PPI. 6. Hypokalemia Will replace potassium orally. 7. Anemia unspecified we'll send off iron studies in the a.m. 8. Type 2 diabetes without complication on low-dose Amaryl. We'll also watch with sliding scale.  All the records are reviewed and case discussed with ED provider. Management plans discussed with the patient, family and they are in agreement.  CODE STATUS: Full code  TOTAL TIME TAKING CARE OF THIS PATIENT: 55 minutes.    Alford Highland M.D on 07/01/2014 at 6:48 PM  Between 7am to 6pm - Pager - [phone]  After 6pm call admission pager [phone]  [place] [facility]  Office  [phone]  CC: Primary care physician; [facility] primary care: Alda Ponder

## 2014-07-01 NOTE — Progress Notes (Signed)
Pt takes 100mg  of tramadol bid. Spoke with Dr. . MD to order.

## 2014-07-01 NOTE — ED Notes (Signed)
Reports disoriented, recently treated for UTI, having L flank pain

## 2014-07-02 LAB — CBC
HCT: 26 % — ABNORMAL LOW (ref 35.0–47.0)
HEMOGLOBIN: 9.1 g/dL — AB (ref 12.0–16.0)
MCH: 33.6 pg (ref 26.0–34.0)
MCHC: 34.9 g/dL (ref 32.0–36.0)
MCV: 96.2 fL (ref 80.0–100.0)
Platelets: 201 10*3/uL (ref 150–440)
RBC: 2.7 MIL/uL — AB (ref 3.80–5.20)
RDW: 15 % — ABNORMAL HIGH (ref 11.5–14.5)
WBC: 4.2 10*3/uL (ref 3.6–11.0)

## 2014-07-02 LAB — BASIC METABOLIC PANEL
Anion gap: 6 (ref 5–15)
BUN: 11 mg/dL (ref 6–20)
CALCIUM: 8.3 mg/dL — AB (ref 8.9–10.3)
CO2: 30 mmol/L (ref 22–32)
Chloride: 90 mmol/L — ABNORMAL LOW (ref 101–111)
Creatinine, Ser: 0.81 mg/dL (ref 0.44–1.00)
GFR calc Af Amer: >60 mL/min (ref >60)
GFR calc non Af Amer: >60 mL/min (ref >60)
GLUCOSE: 141 mg/dL — AB (ref 65–99)
Potassium: 3.4 mmol/L — ABNORMAL LOW (ref 3.5–5.1)
Sodium: 126 mmol/L — ABNORMAL LOW (ref 135–145)

## 2014-07-02 LAB — GLUCOSE, CAPILLARY
GLUCOSE-CAPILLARY: 103 mg/dL — AB (ref 65–99)
GLUCOSE-CAPILLARY: 164 mg/dL — AB (ref 65–99)
Glucose-Capillary: 129 mg/dL — ABNORMAL HIGH (ref 65–99)
Glucose-Capillary: 179 mg/dL — ABNORMAL HIGH (ref 65–99)

## 2014-07-02 LAB — OSMOLALITY, URINE: Osmolality, Ur: 198 mOsm/kg — ABNORMAL LOW (ref 300–900)

## 2014-07-02 LAB — SODIUM, URINE, RANDOM: Sodium, Ur: 58 mmol/L

## 2014-07-02 NOTE — Progress Notes (Signed)
[facility] - [place] at [facility]   PATIENT NAME: Stefanie Pham    MR#:  [id number]  DATE OF BIRTH:  [birth year]  SUBJECTIVE:  CHIEF COMPLAINT:   Chief Complaint  Patient presents with  . Altered Mental Status   seemed to be feeling better.  Sodium improving, her mental status seems close to baseline  REVIEW OF SYSTEMS:  Review of Systems  Constitutional: Negative for fever, weight loss, malaise/fatigue and diaphoresis.  HENT: Negative for ear discharge, ear pain, hearing loss, nosebleeds, sore throat and tinnitus.   Eyes: Negative for blurred vision and pain.  Respiratory: Negative for cough, hemoptysis, shortness of breath and wheezing.   Cardiovascular: Negative for chest pain, palpitations, orthopnea and leg swelling.  Gastrointestinal: Negative for heartburn, nausea, vomiting, abdominal pain, diarrhea, constipation and blood in stool.  Genitourinary: Negative for dysuria, urgency and frequency.  Musculoskeletal: Negative for myalgias and back pain.  Skin: Negative for itching and rash.  Neurological: Negative for dizziness, tingling, tremors, focal weakness, seizures, weakness and headaches.  Psychiatric/Behavioral: Negative for depression. The patient is not nervous/anxious.    DRUG ALLERGIES:   Allergies  Allergen Reactions  . Morphine And Related Shortness Of Breath  . Codeine Nausea And Vomiting   VITALS:  Blood pressure 150/57, pulse 59, temperature 98.1 F (36.7 C), temperature source Oral, resp. rate 20, height 5\' 7"  (1.702 m), weight 100.88 kg (222 lb 6.4 oz), SpO2 99 %. PHYSICAL EXAMINATION:  Physical Exam  Constitutional: She is oriented to person, place, and time and well-developed, well-nourished, and in no distress.  HENT:  Head: Normocephalic and atraumatic.  Eyes: Conjunctivae and EOM are normal. Pupils are equal, round, and reactive to light.  Neck: Normal range of motion. Neck supple. No tracheal deviation present. No  thyromegaly present.  Cardiovascular: Normal rate, regular rhythm and normal heart sounds.   Pulmonary/Chest: Effort normal and breath sounds normal. No respiratory distress. She has no wheezes. She exhibits no tenderness.  Abdominal: Soft. Bowel sounds are normal. She exhibits no distension. There is no tenderness.  Musculoskeletal: Normal range of motion.  Neurological: She is alert and oriented to person, place, and time. No cranial nerve deficit.  Skin: Skin is warm and dry. No rash noted.  Psychiatric: Mood and affect normal.   LABORATORY PANEL:   CBC  Recent Labs Lab 07/02/14 0305  WBC 4.2  HGB 9.1*  HCT 26.0*  PLT 201   ------------------------------------------------------------------------------------------------------------------ Chemistries   Recent Labs Lab 07/01/14 1347 07/02/14 0305  NA 122* 126*  K 3.4* 3.4*  CL 85* 90*  CO2 26 30  GLUCOSE 135* 141*  BUN 12 11  CREATININE 0.85 0.81  CALCIUM 8.8* 8.3*  AST 36  --   ALT 23  --   ALKPHOS 76  --   BILITOT 0.2*  --    RADIOLOGY:  Ct Head Wo Contrast  07/01/2014   CLINICAL DATA:  Altered mental status, disorientation, recent UTI, history hypertension, diabetes mellitus, multiple sclerosis  EXAM: CT HEAD WITHOUT CONTRAST  TECHNIQUE: Contiguous axial images were obtained from the base of the skull through the vertex without intravenous contrast.  COMPARISON:  02/24/2014  FINDINGS: Streak artifacts at skull base.  Generalized atrophy.  Normal ventricular morphology.  No midline shift or mass effect.  Small vessel chronic ischemic changes of deep cerebral white matter.  No intracranial hemorrhage, mass lesion or evidence acute infarction.  No extra-axial fluid collections.  Sinuses clear bones unremarkable.  IMPRESSION: Atrophy with small vessel chronic  ischemic changes of deep cerebral white matter.  No acute intracranial abnormalities.   Electronically Signed   By: Ulyses Southward M.D.   On: 07/01/2014 17:03    ASSESSMENT AND PLAN:   1. Hyponatremia, acute metabolic encephalopathy: Continue IV fluid hydration with normal saline and continue to monitor serial sodiums (122 -> 126).  culprit is probably hydrochlorothiazide this medication is already stopped.  Tolerating regular diet. Physical therapy consultation ordered for the patient's weakness.  2. Multiple sclerosis I do not think she has an exacerbation. Continue her usual medications. 3. Chronic pain syndrome on methadone, gabapentin. 4. Essential hypertension- blood pressure slightly elevated, monitor off hydrochlorothiazide, continue benazepril. 5. Gastroesophageal reflux disease without esophagitis continue PPI. 6. Hypokalemia: Replete and recheck 7. Anemia unspecified: Checking iron studies 8. Type 2 diabetes without complication on low-dose Amaryl.  Monitor on sliding scale.  All the records are reviewed and case discussed with Care Management/Social Workerr. Management plans discussed with the patient, family and they are in agreement.  CODE STATUS: Full code  TOTAL TIME TAKING CARE OF THIS PATIENT: 35 minutes.   More than 50% of the time was spent in counseling/coordination of care: YES  POSSIBLE D/C IN 1-2 DAYS, DEPENDING ON CLINICAL CONDITION.   [facility], Auda Finfrock M.D on 07/02/2014 at 7:32 AM  Between 7am to 6pm - Pager - [phone]  After 6pm go to [email or web address] - password [email or web address] [facility]  [place] [facility]  Office  [phone]  CC:  Primary care physician; No primary care provider on file.

## 2014-07-02 NOTE — Evaluation (Signed)
Physical Therapy Evaluation Patient Details Name: Stefanie Pham MRN: [id number] DOB: 09/09/1933 Today's Date: 07/02/2014   History of Present Illness  Pt with ~1 weeks of AMS and weakness  Clinical Impression  Pt does poorly with PT and does not at all appear safe enough to go home with mobility, transfers, "ambulation."  She does have some chronic L sided weakness (related to MS?) but has been significantly weak with some AMS recently.  Pt's husband is completely uninterested in going to rehab, but also agrees that he/pt will have a hard time at home if she does not get stronger.      Follow Up Recommendations SNF (husband adamant about not having her go to rehab... Period.)    Equipment Recommendations       Recommendations for Other Services       Precautions / Restrictions Precautions Precautions: Fall Restrictions Weight Bearing Restrictions: No      Mobility  Bed Mobility Overal bed mobility: Needs Assistance Bed Mobility: Supine to Sit;Sit to Supine     Supine to sit: Max assist Sit to supine: Max assist   General bed mobility comments: Pt able to give some minimal help, but displays poor balance and ability to functionally get herself up  Transfers Overall transfer level: Needs assistance Equipment used: Rolling walker (2 wheeled) Transfers: Sit to/from Stand Sit to Stand: Max assist;Mod assist         General transfer comment: Pt is loosing balance t/o most of the effort and needs considerable assist just to remain upright. Pt fearful and unsteady.  Ambulation/Gait Ambulation/Gait assistance: Max assist Ambulation Distance (Feet): 3 Feet Assistive device: Rolling walker (2 wheeled)       General Gait Details: Pt able to take a few side steps along EOB and 1 step forward and back but is very unsteady, lacks confidence and generally does not do well with the effort.   Stairs            Wheelchair Mobility    Modified Rankin (Stroke  Patients Only)       Balance                                             Pertinent Vitals/Pain Pain Assessment:  (reports only vague minimal pain)    Home Living Family/patient expects to be discharged to:: Private residence (family refusing to go to rehab despite much encouragement) Living Arrangements: Spouse/significant other Available Help at Discharge: Family   Home Access: Ramped entrance       Home Equipment: Environmental consultant - 2 wheels      Prior Function Level of Independence: Needs assistance   Gait / Transfers Assistance Needed: pt has been needing 2-3 family members to get to standing, etc           Hand Dominance        Extremity/Trunk Assessment   Upper Extremity Assessment: Generalized weakness (L UE weaker than R)           Lower Extremity Assessment: Generalized weakness         Communication   Communication: No difficulties  Cognition Arousal/Alertness: Awake/alert Behavior During Therapy: Anxious Overall Cognitive Status: Impaired/Different from baseline (though husband reports she is much better than yesterday)                      General Comments  Exercises        Assessment/Plan    PT Assessment Patient needs continued PT services  PT Diagnosis Difficulty walking;Generalized weakness   PT Problem List Decreased strength;Decreased activity tolerance;Decreased balance;Decreased mobility;Decreased coordination;Decreased safety awareness  PT Treatment Interventions Gait training;Therapeutic activities;Therapeutic exercise;Functional mobility training;Balance training;Neuromuscular re-education;Cognitive remediation;Patient/family education   PT Goals (Current goals can be found in the Care Plan section) Acute Rehab PT Goals Patient Stated Goal: Not going to rehab PT Goal Formulation: With patient Time For Goal Achievement: 07/16/14 Potential to Achieve Goals: Fair    Frequency Min 2X/week    Barriers to discharge        Co-evaluation               End of Session Equipment Utilized During Treatment: Gait belt Activity Tolerance: Patient limited by fatigue (appears anxious) Patient left: with bed alarm set           Time: 9628-3662 PT Time Calculation (min) (ACUTE ONLY): 25 min   Charges:   PT Evaluation $Initial PT Evaluation Tier I: 1 Procedure     PT G Codes:       Loran Senters, PT, DPT [phone]  Malachi Pro 07/02/2014, 1:28 PM

## 2014-07-02 NOTE — Progress Notes (Signed)
Patient VSS this shift & no complaints of pain. Patient is on tele & IV fluids infusing. Continue to monitor.

## 2014-07-02 NOTE — Progress Notes (Signed)
CCU nurse called and advised that patient was in Sinus arrhythimia; 1st degree heart block with a couple of missed beats.

## 2014-07-02 NOTE — Progress Notes (Signed)
Pt is alert and oriented. Forgetful at times. Incontanant of urine this shift. Does not ambulate. Able to sleep in between care. Recent labs showing increase in sodium level. Iv infusing without difficulty.

## 2014-07-02 NOTE — Progress Notes (Signed)
Pt has been incontanant of urine. Unable to obtain urine specimen

## 2014-07-03 LAB — CBC
HCT: 28.9 % — ABNORMAL LOW (ref 35.0–47.0)
HEMOGLOBIN: 9.7 g/dL — AB (ref 12.0–16.0)
MCH: 32.4 pg (ref 26.0–34.0)
MCHC: 33.4 g/dL (ref 32.0–36.0)
MCV: 97 fL (ref 80.0–100.0)
Platelets: 235 10*3/uL (ref 150–440)
RBC: 2.98 MIL/uL — AB (ref 3.80–5.20)
RDW: 16 % — ABNORMAL HIGH (ref 11.5–14.5)
WBC: 6.4 10*3/uL (ref 3.6–11.0)

## 2014-07-03 LAB — GLUCOSE, CAPILLARY
GLUCOSE-CAPILLARY: 167 mg/dL — AB (ref 65–99)
GLUCOSE-CAPILLARY: 133 mg/dL — AB (ref 65–99)
Glucose-Capillary: 82 mg/dL (ref 65–99)
Glucose-Capillary: 111 mg/dL — ABNORMAL HIGH (ref 65–99)

## 2014-07-03 LAB — BASIC METABOLIC PANEL
Anion gap: 6 (ref 5–15)
BUN: 11 mg/dL (ref 6–20)
CALCIUM: 8.8 mg/dL — AB (ref 8.9–10.3)
CO2: 29 mmol/L (ref 22–32)
Chloride: 98 mmol/L — ABNORMAL LOW (ref 101–111)
Creatinine, Ser: 0.77 mg/dL (ref 0.44–1.00)
GFR calc non Af Amer: >60 mL/min (ref >60)
Glucose, Bld: 156 mg/dL — ABNORMAL HIGH (ref 65–99)
Potassium: 3.9 mmol/L (ref 3.5–5.1)
Sodium: 133 mmol/L — ABNORMAL LOW (ref 135–145)

## 2014-07-03 MED ORDER — DOCUSATE SODIUM 100 MG PO CAPS
200.0000 mg | ORAL_CAPSULE | Freq: Every day | ORAL | Status: DC
  Administered 2014-07-03: 200 mg via ORAL
  Filled 2014-07-03: qty 2

## 2014-07-03 MED ORDER — GABAPENTIN 300 MG PO CAPS
600.0000 mg | ORAL_CAPSULE | Freq: Every morning | ORAL | Status: DC
  Administered 2014-07-04: 600 mg via ORAL
  Filled 2014-07-03: qty 2

## 2014-07-03 MED ORDER — BISACODYL 5 MG PO TBEC
10.0000 mg | DELAYED_RELEASE_TABLET | Freq: Every day | ORAL | Status: DC | PRN
  Administered 2014-07-03: 10 mg via ORAL
  Filled 2014-07-03: qty 2

## 2014-07-03 MED ORDER — GABAPENTIN 300 MG PO CAPS
600.0000 mg | ORAL_CAPSULE | Freq: Once | ORAL | Status: AC
  Administered 2014-07-03: 600 mg via ORAL
  Filled 2014-07-03: qty 2

## 2014-07-03 MED ORDER — METHADONE HCL 10 MG PO TABS
5.0000 mg | ORAL_TABLET | Freq: Four times a day (QID) | ORAL | Status: DC
  Administered 2014-07-03 – 2014-07-04 (×3): 5 mg via ORAL
  Filled 2014-07-03 (×3): qty 1

## 2014-07-03 MED ORDER — PNEUMOCOCCAL VAC POLYVALENT 25 MCG/0.5ML IJ INJ: 0.5000 mL | INJECTION | INTRAMUSCULAR | Status: DC

## 2014-07-03 MED ORDER — PRIMIDONE 50 MG PO TABS
50.0000 mg | ORAL_TABLET | Freq: Three times a day (TID) | ORAL | Status: DC
  Administered 2014-07-03 – 2014-07-04 (×2): 50 mg via ORAL
  Filled 2014-07-03 (×2): qty 1

## 2014-07-03 MED ORDER — IBUPROFEN 400 MG PO TABS
600.0000 mg | ORAL_TABLET | ORAL | Status: AC
  Administered 2014-07-03: 600 mg via ORAL
  Filled 2014-07-03: qty 2

## 2014-07-03 MED ORDER — GABAPENTIN 300 MG PO CAPS
300.0000 mg | ORAL_CAPSULE | Freq: Three times a day (TID) | ORAL | Status: DC
  Administered 2014-07-03 – 2014-07-04 (×2): 300 mg via ORAL
  Filled 2014-07-03 (×2): qty 1

## 2014-07-03 MED ORDER — GABAPENTIN 300 MG PO CAPS
300.0000 mg | ORAL_CAPSULE | Freq: Every day | ORAL | Status: DC
  Administered 2014-07-03 (×2): 300 mg via ORAL
  Filled 2014-07-03 (×3): qty 1

## 2014-07-03 MED ORDER — AMLODIPINE BESYLATE 5 MG PO TABS
5.0000 mg | ORAL_TABLET | Freq: Every day | ORAL | Status: DC
  Administered 2014-07-03 – 2014-07-04 (×2): 5 mg via ORAL
  Filled 2014-07-03 (×2): qty 1

## 2014-07-03 NOTE — Progress Notes (Signed)
SPOUSE REPORTS  COLACE /PRIMADONE  AND GABAPENTIN  BEING ADM INCORRECTLY. PT TAKES GABAPENTIN 600MG  PO QAM,300MG  TID,COLACE 200MG  PO QHS AND DULCOLAX 10MG   ONCE.SPOKE WITH DR  WHO VERIFIED MED CHG

## 2014-07-03 NOTE — Progress Notes (Signed)
Early morning patient has had increased pain in left toes and foot & low grade fever.  Dr. Wiliam Ke feels this may be a result of a MS flare. Pain has decreased this afternoon after meds administered. Husband has refused patient's sliding scale coverage of patient; says she does not take at home.  Tele has been d/c'd.  Pt has c/o constipation; stool softener given. Continue to monitor.

## 2014-07-03 NOTE — Progress Notes (Signed)
[facility] [place] at [facility]   PATIENT NAME: Stefanie Pham    MR#:  [id number]  DATE OF BIRTH:  1933-12-28  SUBJECTIVE:  CHIEF COMPLAINT:   Chief Complaint  Patient presents with  . Altered Mental Status   - Patient admitted for weakness.. Recently treated for UTI. -Sodium was noted to be low when admitted. Sodium improved with IV fluids to 133 today. -Patient has significant left foot pain from neuropathy this morning. Husband at bedside and very worried about it. She also did have a low-grade temperature of 100.57F last night.  REVIEW OF SYSTEMS:  Review of Systems  Constitutional: Negative for fever, weight loss, malaise/fatigue and diaphoresis.  HENT: Negative for ear discharge, ear pain, hearing loss, nosebleeds, sore throat and tinnitus.   Eyes: Negative for blurred vision and pain.  Respiratory: Negative for cough, hemoptysis, shortness of breath and wheezing.   Cardiovascular: Negative for chest pain, palpitations, orthopnea and leg swelling.  Gastrointestinal: Negative for heartburn, nausea, vomiting, abdominal pain, diarrhea, constipation and blood in stool.  Genitourinary: Negative for dysuria, urgency and frequency.  Musculoskeletal: Positive for joint pain. Negative for myalgias and back pain.       Significant left foot pain  Skin: Negative for itching and rash.  Neurological: Positive for weakness. Negative for dizziness, tingling, tremors, focal weakness, seizures and headaches.  Psychiatric/Behavioral: Negative for depression. The patient is not nervous/anxious.    DRUG ALLERGIES:   Allergies  Allergen Reactions  . Morphine And Related Shortness Of Breath  . Codeine Nausea And Vomiting   VITALS:  Blood pressure 150/62, pulse 83, temperature 99.9 F (37.7 C), temperature source Oral, resp. rate 18, height 5\' 7"  (1.702 m), weight 100.88 kg (222 lb 6.4 oz), SpO2 95 %. PHYSICAL EXAMINATION:  Physical Exam  Constitutional: She is  oriented to person, place, and time. She appears distressed.  HENT:  Head: Normocephalic and atraumatic.  Eyes: Conjunctivae and EOM are normal. Pupils are equal, round, and reactive to light.  Neck: Normal range of motion. Neck supple. No tracheal deviation present. No thyromegaly present.  Cardiovascular: Normal rate, regular rhythm and normal heart sounds.   Pulmonary/Chest: Breath sounds normal. No respiratory distress. She has no wheezes. She exhibits no tenderness.  Increased respiratory effort due to pain.  Abdominal: Soft. Bowel sounds are normal. She exhibits no distension. There is no tenderness.  Musculoskeletal: Normal range of motion.  Neurological: She is alert and oriented to person, place, and time. No cranial nerve deficit.  Does have increased paresthesias and pain of the left foot extending onto mid leg  Skin: Skin is warm and dry. No rash noted.  Psychiatric: Mood and affect normal.   LABORATORY PANEL:   CBC  Recent Labs Lab 07/03/14 0321  WBC 6.4  HGB 9.7*  HCT 28.9*  PLT 235   ------------------------------------------------------------------------------------------------------------------ Chemistries   Recent Labs Lab 07/01/14 1347  07/03/14 0321  NA 122*  < > 133*  K 3.4*  < > 3.9  CL 85*  < > 98*  CO2 26  < > 29  GLUCOSE 135*  < > 156*  BUN 12  < > 11  CREATININE 0.85  < > 0.77  CALCIUM 8.8*  < > 8.8*  AST 36  --   --   ALT 23  --   --   ALKPHOS 76  --   --   BILITOT 0.2*  --   --   < > = values in this interval  not displayed. RADIOLOGY:  No results found. ASSESSMENT AND PLAN:   1. Hyponatremia, acute metabolic encephalopathy:  -Improving with IV fluids. Likely hypovolemic -Sodium is at 133 today. Hydrochlorothiazide has been stopped. -Physical therapy worked with the patient and recommended she have however has been interested in taking patient home. -Encouraged her to sit in the chair again today.  2. Multiple sclerosis: Her usual  medications are being continued. There has been a delay in giving her gabapentin today, complaining of left foot pain. It does not improve with gabapentin might have to consider MS flare and will need to start steroids. This tried to reach Dr. Elwyn Reach at [phone] today. Continue her usual medications.  3. Chronic pain syndrome on methadone, gabapentin. Adjusted gabapentin times to match her home times  4. Essential hypertension- blood pressure slightly elevated, monitor off hydrochlorothiazide, continue benazepril. Add Norvasc today.  5. Gastroesophageal reflux disease without esophagitis continue PPI.  7. Anemia unspecified: Stable. No indication for any transfusion.   8. Type 2 diabetes without complication on low-dose Amaryl.  Monitor on sliding scale.  9. DVT prophylaxis-on Lovenox.  All the records are reviewed and case discussed with Care Management/Social Workerr. Management plans discussed with the patient, family and they are in agreement.  CODE STATUS: Full code  TOTAL TIME TAKING CARE OF THIS PATIENT: 37 minutes.  Discussed in detail with husband at bedside. Also tried to reach her neurologist Dr. Elwyn Reach at [facility].  More than 50% of the time was spent in counseling/coordination of care: YES  POSSIBLE D/C IN 1-2 DAYS, DEPENDING ON CLINICAL CONDITION.   Enid Baas M.D on 07/03/2014 at 10:52 AM  Between 7am to 6pm - Pager - [phone]  After 6pm go to [email or web address] - password [email or web address]  [place]  Office  [phone]  CC:  Primary care physician; No primary care provider on file.

## 2014-07-04 LAB — BASIC METABOLIC PANEL
Anion gap: 7 (ref 5–15)
BUN: 14 mg/dL (ref 6–20)
CO2: 27 mmol/L (ref 22–32)
Calcium: 8.2 mg/dL — ABNORMAL LOW (ref 8.9–10.3)
Chloride: 101 mmol/L (ref 101–111)
Creatinine, Ser: 0.73 mg/dL (ref 0.44–1.00)
GFR calc Af Amer: >60 mL/min (ref >60)
GFR calc non Af Amer: >60 mL/min (ref >60)
Glucose, Bld: 111 mg/dL — ABNORMAL HIGH (ref 65–99)
POTASSIUM: 3.6 mmol/L (ref 3.5–5.1)
SODIUM: 135 mmol/L (ref 135–145)

## 2014-07-04 LAB — GLUCOSE, CAPILLARY
GLUCOSE-CAPILLARY: 133 mg/dL — AB (ref 65–99)
Glucose-Capillary: 126 mg/dL — ABNORMAL HIGH (ref 65–99)

## 2014-07-04 MED ORDER — AMLODIPINE BESYLATE 2.5 MG PO TABS: 2.5000 mg | ORAL_TABLET | Freq: Every day | ORAL | Status: DC

## 2014-07-04 NOTE — Progress Notes (Signed)
Physical Therapy Treatment Patient Details Name: Stefanie Pham MRN: [id number] DOB: May 25, 1933 Today's Date: 07/04/2014    History of Present Illness Pt with ~1 weeks of AMS and weakness    PT Comments    Pt is extremely weak requiring +3 for bed mobility. MaxA+2 for attempted sit to stand and pt unable to come up to standing. Pt is reporting high levels of neck pain and is very anxious about falling. RN notified. Emphasized need for SNF to patient and family but they continue to refuse. RN and CSW notified of recommendations and findings. Pt will benefit from skilled PT services to address deficits in strength, balance, and mobility in order to return to full function at home.    Follow Up Recommendations  SNF (Currently refuses. Please arrange [facility] PT)     Equipment Recommendations  None recommended by PT    Recommendations for Other Services       Precautions / Restrictions Precautions Precautions: Fall Restrictions Weight Bearing Restrictions: No    Mobility  Bed Mobility Overal bed mobility: Needs Assistance Bed Mobility: Supine to Sit;Sit to Supine     Supine to sit: Max assist;+2 for physical assistance (+3 for supine to sit) Sit to supine: Max assist;+2 for physical assistance   General bed mobility comments: Pt is extremely weak. Reports R wrist pain which limits use of RUE. Pt complaining of severe neck pain with all bed mobility  Transfers Overall transfer level: Needs assistance Equipment used: Rolling walker (2 wheeled) Transfers: Sit to/from Stand Sit to Stand: Max assist;+2 physical assistance         General transfer comment: Pt requires maxA+2 and heavy cues for attempted sit to stand and is still unabel to come to standing. Severe LE weakness as well as high fear of falling. Extensive cues for positioning and anterior weight shifting. Two attempts made and pt unable to come to standing.  Ambulation/Gait Ambulation/Gait assistance:  (Unable)               Stairs            Wheelchair Mobility    Modified Rankin (Stroke Patients Only)       Balance Overall balance assessment: Needs assistance Sitting-balance support: Bilateral upper extremity supported;Feet supported Sitting balance-Leahy Scale: Poor Sitting balance - Comments: Pt falling backward when sitting up at EOB requiring assist for back as well as bilateral UE assist to prevent falling posteriorly     Standing balance-Leahy Scale: Zero                      Cognition Arousal/Alertness: Awake/alert Behavior During Therapy: Anxious Overall Cognitive Status: Within Functional Limits for tasks assessed                      Exercises      General Comments        Pertinent Vitals/Pain Pain Assessment: 0-10 Pain Score: 10-Worst pain ever Pain Location: Neck Pain Intervention(s): Monitored during session (RN notified)    Home Living                      Prior Function            PT Goals (current goals can now be found in the care plan section) Acute Rehab PT Goals Patient Stated Goal: "I'm going home." Both patient and husband adamant about going home at discharge. Husband states there is no way they will send  her to SNF PT Goal Formulation: With patient/family Time For Goal Achievement: 07/16/14 Potential to Achieve Goals: Fair Progress towards PT goals: Not progressing toward goals - comment    Frequency  Min 2X/week    PT Plan Current plan remains appropriate    Co-evaluation             End of Session Equipment Utilized During Treatment: Gait belt Activity Tolerance: Patient limited by pain Patient left: in bed;with bed alarm set;with family/visitor present;with call bell/phone within reach     Time: 1145-1210 PT Time Calculation (min) (ACUTE ONLY): 25 min  Charges:  $Therapeutic Activity: 23-37 mins                    G Codes:      Sharalyn Ink Miosha Behe PT, DPT   Kaiyu Mirabal 07/04/2014,  1:23 PM

## 2014-07-04 NOTE — Progress Notes (Addendum)
Clinical Social Worker (CSW) met with patient and her husband, daughter, and son were at bedside. CSW explained that PT is recommending rehab. Patient and family refused SNF and want to take patient home. Family watched PT session today. CSW completed EMS packet for home. Husband confirmed address for EMS. RN Case Manager aware of above. Please reconsult if future social work needs arise. CSW signing off.    Morgan, LCSWA [phone] 

## 2014-07-04 NOTE — Progress Notes (Signed)
[facility] - [place] at [facility]   PATIENT NAME: Stefanie Pham    MR#:  [id number]  DATE OF BIRTH:  1933/07/22  SUBJECTIVE:  CHIEF COMPLAINT:   Chief Complaint  Patient presents with  . Altered Mental Status   - No further fevers today. Feels much better after her Neurontin doses have been adjusted to home doses. -Husband at bedside, pleased with the care today. Wants to take patient home with home health instead of rehabilitation. -Sodium has improved  REVIEW OF SYSTEMS:  Review of Systems  Constitutional: Negative for fever, weight loss, malaise/fatigue and diaphoresis.  HENT: Negative for ear discharge, ear pain, hearing loss, nosebleeds, sore throat and tinnitus.   Eyes: Negative for blurred vision and pain.  Respiratory: Negative for cough, hemoptysis, shortness of breath and wheezing.   Cardiovascular: Negative for chest pain, palpitations, orthopnea and leg swelling.  Gastrointestinal: Negative for heartburn, nausea, vomiting, abdominal pain, diarrhea, constipation and blood in stool.  Genitourinary: Negative for dysuria, urgency and frequency.  Musculoskeletal: Positive for joint pain. Negative for myalgias and back pain.       Has chronic joint pains today. No further significant feet pain.  Skin: Negative for itching and rash.  Neurological: Positive for weakness. Negative for dizziness, tingling, tremors, focal weakness, seizures and headaches.  Psychiatric/Behavioral: Negative for depression. The patient is not nervous/anxious.    DRUG ALLERGIES:   Allergies  Allergen Reactions  . Morphine And Related Shortness Of Breath  . Codeine Nausea And Vomiting   VITALS:  Blood pressure 108/44, pulse 58, temperature 97.8 F (36.6 C), temperature source Oral, resp. rate 18, height 5\' 7"  (1.702 m), weight 100.88 kg (222 lb 6.4 oz), SpO2 95 %. PHYSICAL EXAMINATION:  Physical Exam  Constitutional: She is oriented to person, place, and time.  HENT:   Head: Normocephalic and atraumatic.  Eyes: Conjunctivae and EOM are normal. Pupils are equal, round, and reactive to light.  Neck: Normal range of motion. Neck supple. No tracheal deviation present. No thyromegaly present.  Cardiovascular: Normal rate, regular rhythm and normal heart sounds.   Pulmonary/Chest: Breath sounds normal. No respiratory distress. She has no wheezes. She exhibits no tenderness.  Makes some grunting noises during breathing with seems like chronic for patient.  Abdominal: Soft. Bowel sounds are normal. She exhibits no distension. There is no tenderness.  Musculoskeletal: Normal range of motion.  Neurological: She is alert and oriented to person, place, and time. No cranial nerve deficit.  Does have chronic paresthesias in the feet, no new changes.  Skin: Skin is warm and dry. No rash noted.  Psychiatric: Mood and affect normal.   LABORATORY PANEL:   CBC  Recent Labs Lab 07/03/14 0321  WBC 6.4  HGB 9.7*  HCT 28.9*  PLT 235   ------------------------------------------------------------------------------------------------------------------ Chemistries   Recent Labs Lab 07/01/14 1347  07/04/14 0326  NA 122*  < > 135  K 3.4*  < > 3.6  CL 85*  < > 101  CO2 26  < > 27  GLUCOSE 135*  < > 111*  BUN 12  < > 14  CREATININE 0.85  < > 0.73  CALCIUM 8.8*  < > 8.2*  AST 36  --   --   ALT 23  --   --   ALKPHOS 76  --   --   BILITOT 0.2*  --   --   < > = values in this interval not displayed. RADIOLOGY:  No results found. ASSESSMENT AND  PLAN:   1. Hyponatremia, acute metabolic encephalopathy:  -Improving with IV fluids. Likely hypovolemic -Sodium is at 135 today. Hydrochlorothiazide has been stopped. -Physical therapy worked with the patient and recommended rehabilitation however husband interested in taking patient home.already have home health services in place. -Encouraged her to sit in the chair again today.  2. Multiple sclerosis: Her usual  medications are being continued. Stable now. Follows with Dr. Elwyn Reach at [facility] at [phone]. Continue her usual medications.  3. Chronic pain syndrome on methadone, gabapentin. Adjusted gabapentin times to match her home times  4. Essential hypertension- blood pressure better today, monitor off hydrochlorothiazide, continue benazepril. Added Norvasc.  5. Gastroesophageal reflux disease without esophagitis continue PPI.  7. Anemia unspecified: Stable. No indication for any transfusion.   8. Type 2 diabetes without complication on low-dose Amaryl.  Monitor on sliding scale.  9. DVT prophylaxis-on Lovenox.  All the records are reviewed and case discussed with Care Management/Social Workerr. Management plans discussed with the patient, family and they are in agreement.  CODE STATUS: Full code  TOTAL TIME TAKING CARE OF THIS PATIENT: 37 minutes.  Discussed in detail with husband at bedside. Also tried to reach her neurologist Dr. Elwyn Reach at [facility].  More than 50% of the time was spent in counseling/coordination of care: YES  POSSIBLE D/C TODAY WITH HOME HEALTH, DEPENDING ON CLINICAL CONDITION.   Georgi Tuel M.D on 07/04/2014 at 7:56 AM  Between 7am to 6pm - Pager - [phone]  After 6pm go to [email or web address] - password [email or web address]  [place] [facility]  Office  [phone]  CC:  Primary care physician; No primary care provider on file.

## 2014-07-04 NOTE — Discharge Summary (Signed)
[facility] - [place] at [facility]   PATIENT NAME: Stefanie Pham    MR#:  [id number]  DATE OF BIRTH:  1933/08/14  DATE OF ADMISSION:  07/01/2014 ADMITTING PHYSICIAN: Alford Highland, MD  DATE OF DISCHARGE: 07/04/2014  PRIMARY CARE PHYSICIAN: [facility] in [place]   ADMISSION DIAGNOSIS:  Acute hyponatremia [E87.1] Ataxia [R27.0] Generalized weakness [R53.1]  DISCHARGE DIAGNOSIS:  Active Problems:   Hyponatremia   SECONDARY DIAGNOSIS:   Past Medical History  Diagnosis Date  . Hypertension   . Multiple sclerosis   . Diabetes mellitus without complication   . Hernia, inguinal   . Diverticulitis     HOSPITAL COURSE:   1. Hyponatremia, acute metabolic encephalopathy:  -Improving with IV fluids. Likely hypovolemic -Sodium is at 135 today. Hydrochlorothiazide has been stopped. -Physical therapy worked with the patient and recommended rehabilitation however husband interested in taking patient home.already have home health services in place. -Encouraged her to sit in the chair again today.  2. Multiple sclerosis: Her usual medications are being continued. Stable now. Follows with Dr. Elwyn Reach at [facility] at [phone]. Continue her usual medications.  3. Chronic pain syndrome on methadone, gabapentin. Adjusted gabapentin times to match her home times  4. Essential hypertension- blood pressure better today, monitor off hydrochlorothiazide, continue benazepril. Added Norvasc.  5. Gastroesophageal reflux disease without esophagitis continue PPI.  7. Anemia unspecified: Stable. No indication for any transfusion.  8. Type 2 diabetes without complication on low-dose Amaryl.  DISCHARGE CONDITIONS:   Stable  CONSULTS OBTAINED:    none  DRUG ALLERGIES:   Allergies  Allergen Reactions  . Morphine And Related Shortness Of Breath  . Codeine Nausea And Vomiting    DISCHARGE MEDICATIONS:   Current Discharge Medication List    START taking  these medications   Details  amLODipine (NORVASC) 2.5 MG tablet Take 1 tablet (2.5 mg total) by mouth daily. Qty: 30 tablet, Refills: 1      CONTINUE these medications which have NOT CHANGED   Details  aspirin EC 81 MG tablet Take 81 mg by mouth at bedtime.    benazepril (LOTENSIN) 40 MG tablet Take 40 mg by mouth daily.    Bioflavonoid Products (VITAMIN C) CHEW Chew 1 each by mouth at bedtime.    CALCIUM PO Take 1 each by mouth at bedtime.    Cholecalciferol (HM VITAMIN D3) 4000 UNITS CAPS Take 1 capsule by mouth at bedtime.    dicyclomine (BENTYL) 10 MG capsule Take 10 mg by mouth 2 (two) times daily.    diphenhydrAMINE (BENADRYL) 25 mg capsule Take 50 mg by mouth at bedtime as needed for sleep.    docusate sodium (COLACE) 100 MG capsule Take 100-200 mg by mouth 2 (two) times daily as needed for mild constipation.    donepezil (ARICEPT) 10 MG tablet Take 10 mg by mouth daily at 12 noon.     gabapentin (NEURONTIN) 300 MG capsule Take 300 mg by mouth 5 (five) times daily.    glimepiride (AMARYL) 1 MG tablet Take 1 mg by mouth daily. If fasting blood sugar is over 120 then pt takes two tablets.    KRILL OIL PO Take 1 capsule by mouth at bedtime.    methadone (DOLOPHINE) 5 MG tablet Take 5 mg by mouth every 6 (six) hours.    mometasone (NASONEX) 50 MCG/ACT nasal spray Place 2 sprays into the nose at bedtime.    Multiple Vitamins-Minerals (MULTIVITAMIN PO) Take 2 each by mouth at bedtime.  omeprazole (PRILOSEC) 20 MG capsule Take 20 mg by mouth daily.    Oxcarbazepine (TRILEPTAL) 300 MG tablet Take 300 mg by mouth 2 (two) times daily.    POTASSIUM PO Take 1 tablet by mouth at bedtime.    primidone (MYSOLINE) 50 MG tablet Take 50 mg by mouth 3 (three) times daily.     Probiotic CAPS Take 1 capsule by mouth daily.    Pyridoxine HCl (VITAMIN B-6 PO) Take 1 tablet by mouth at bedtime.    traMADol (ULTRAM-ER) 100 MG 24 hr tablet Take 100 mg by mouth 2 (two) times daily.       STOP taking these medications     hydrochlorothiazide (HYDRODIURIL) 25 MG tablet          DISCHARGE INSTRUCTIONS:   1. PCP follow-up in 1 week 2. [facility] follow-up in 1-2 weeks 3. Home health physical therapy, nursing, nurse aid  If you experience worsening of your admission symptoms, develop shortness of breath, life threatening emergency, suicidal or homicidal thoughts you must seek medical attention immediately by calling 911 or calling your MD immediately  if symptoms less severe.  You Must read complete instructions/literature along with all the possible adverse reactions/side effects for all the Medicines you take and that have been prescribed to you. Take any new Medicines after you have completely understood and accept all the possible adverse reactions/side effects.   Please note  You were cared for by a hospitalist during your hospital stay. If you have any questions about your discharge medications or the care you received while you were in the hospital after you are discharged, you can call the unit and asked to speak with the hospitalist on call if the hospitalist that took care of you is not available. Once you are discharged, your primary care physician will handle any further medical issues. Please note that NO REFILLS for any discharge medications will be authorized once you are discharged, as it is imperative that you return to your primary care physician (or establish a relationship with a primary care physician if you do not have one) for your aftercare needs so that they can reassess your need for medications and monitor your lab values.    Today   CHIEF COMPLAINT:   Chief Complaint  Patient presents with  . Altered Mental Status    VITAL SIGNS:  Blood pressure 154/65, pulse 75, temperature 98.8 F (37.1 C), temperature source Oral, resp. rate 18, height 5\' 7"  (1.702 m), weight 100.88 kg (222 lb 6.4 oz), SpO2 100 %.  I/O:   Intake/Output Summary  (Last 24 hours) at 07/04/14 0805 Last data filed at 07/03/14 1800  Gross per 24 hour  Intake    944 ml  Output      0 ml  Net    944 ml    PHYSICAL EXAMINATION:   Physical Exam  Constitutional: She is oriented to person, place, and time.  HENT:  Head: Normocephalic and atraumatic.  Eyes: Conjunctivae and EOM are normal. Pupils are equal, round, and reactive to light.  Neck: Normal range of motion. Neck supple. No tracheal deviation present. No thyromegaly present.  Cardiovascular: Normal rate, regular rhythm and normal heart sounds.  Pulmonary/Chest: Breath sounds normal. No respiratory distress. She has no wheezes. She exhibits no tenderness.  Makes some grunting noises during breathing with seems like chronic for patient.  Abdominal: Soft. Bowel sounds are normal. She exhibits no distension. There is no tenderness.  Musculoskeletal: Normal range of motion.  Neurological: She is alert and oriented to person, place, and time. No cranial nerve deficit.  Does have chronic paresthesias in the feet, no new changes.  Skin: Skin is warm and dry. No rash noted.  Psychiatric: Mood and affect normal.   DATA REVIEW:   CBC  Recent Labs Lab 07/03/14 0321  WBC 6.4  HGB 9.7*  HCT 28.9*  PLT 235    Chemistries   Recent Labs Lab 07/01/14 1347  07/04/14 0326  NA 122*  < > 135  K 3.4*  < > 3.6  CL 85*  < > 101  CO2 26  < > 27  GLUCOSE 135*  < > 111*  BUN 12  < > 14  CREATININE 0.85  < > 0.73  CALCIUM 8.8*  < > 8.2*  AST 36  --   --   ALT 23  --   --   ALKPHOS 76  --   --   BILITOT 0.2*  --   --   < > = values in this interval not displayed.  Cardiac Enzymes No results for input(s): TROPONINI in the last 168 hours.  Microbiology Results  No results found for this or any previous visit.  RADIOLOGY:  No results found.  EKG:  No orders found for this or any previous visit.    Management plans discussed with the patient, family and they are in agreement.  CODE  STATUS:     Code Status Orders        Start     Ordered   07/01/14 1823  Full code   Continuous     07/01/14 1824      TOTAL TIME TAKING CARE OF THIS PATIENT: 40 minutes.    Jemmie Rhinehart M.D on 07/04/2014 at 8:05 AM  Between 7am to 6pm - Pager - [phone]  After 6pm go to [email or web address] - password [email or web address] [facility]  [place] [facility]  Office  [phone]  CC: Primary care physician; No primary care provider on file.

## 2014-07-04 NOTE — Care Management Note (Signed)
Case Management Note  Patient Details  Name: Stefanie Pham MRN: [id number] Date of Birth: 1933-06-07  Subjective/Objective:  RNCM assessment for discharge planning. Admitted with hyponatremia. HX; Multiple Sclerosis. Met with pt and spouse. Patient lives at home and is cared for by her spouse. She uses a walker, wheelchair and 3-1. Generalized weakness with difficulty with ambulation. Spouse states wheelchair and bedside commode are in poor condition. Order placed with [facility] for new wheelchair and 3-in-1 for delivery to room. Patient active with [facility] for PT. Spoke with Danny Lawless with [facility] to add nursing and home health aide to services.  Spouse verbalized understanding of POC and is in agreement.                  Action/Plan: [facility]- Nursing, PT and HHA  Expected Discharge Date:  07/04/14               Expected Discharge Plan:  [place]  In-House Referral:     Discharge planning Services  CM Consult  Post Acute Care Choice:  Home Health Choice offered to:  Spouse  DME Arranged:  Wheelchair manual, 3-N-1 DME Agency:     HH Arranged:  RN, PT, Nurse's Aide [place] Agency:   Jackquline [place])  Status of Service:     Medicare Important Message Given:  Yes Date Medicare IM Given:  07/04/14 Medicare IM give by:  Orvan July Date Additional Medicare IM Given:    Additional Medicare Important Message give by:     If discussed at [place] of Stay Meetings, dates discussed:    Additional Comments:  Jolly Mango, RN 07/04/2014, 9:42 AM

## 2014-07-04 NOTE — Discharge Instructions (Signed)
°  DIET:  °Cardiac diet ° °DISCHARGE CONDITION:  °Stable ° °ACTIVITY:  °Activity as tolerated ° °OXYGEN:  °Home Oxygen: No. °  °Oxygen Delivery: room air ° °DISCHARGE LOCATION:  °Home with home health  ° °If you experience worsening of your admission symptoms, develop shortness of breath, life threatening emergency, suicidal or homicidal thoughts you must seek medical attention immediately by calling 911 or calling your MD immediately  if symptoms less severe. ° °You Must read complete instructions/literature along with all the possible adverse reactions/side effects for all the Medicines you take and that have been prescribed to you. Take any new Medicines after you have completely understood and accpet all the possible adverse reactions/side effects.  ° °Please note ° °You were cared for by a hospitalist during your hospital stay. If you have any questions about your discharge medications or the care you received while you were in the hospital after you are discharged, you can call the unit and asked to speak with the hospitalist on call if the hospitalist that took care of you is not available. Once you are discharged, your primary care physician will handle any further medical issues. Please note that NO REFILLS for any discharge medications will be authorized once you are discharged, as it is imperative that you return to your primary care physician (or establish a relationship with a primary care physician if you do not have one) for your aftercare needs so that they can reassess your need for medications and monitor your lab values. ° ° °

## 2014-07-04 NOTE — Progress Notes (Signed)
Pt discharged home via EMS. Home PT, RN and NA set up for home.

## 2018-03-09 ENCOUNTER — Ambulatory Visit
Admission: RE | Admit: 2018-03-09 | Discharge: 2018-03-09 | Disposition: A | Discharge status: Home / Self Care | Payer: Medicare Other | Source: Ambulatory Visit | Attending: Family Medicine | Admitting: Family Medicine

## 2018-03-09 ENCOUNTER — Other Ambulatory Visit: Payer: Self-pay | Admitting: Family Medicine

## 2018-03-09 DIAGNOSIS — R197 Diarrhea, unspecified: Secondary | ICD-10-CM

## 2018-03-09 DIAGNOSIS — L03032 Cellulitis of left toe: Secondary | ICD-10-CM | POA: Insufficient documentation

## 2018-03-09 DIAGNOSIS — M86172 Other acute osteomyelitis, left ankle and foot: Secondary | ICD-10-CM | POA: Diagnosis not present

## 2018-03-09 DIAGNOSIS — R739 Hyperglycemia, unspecified: Secondary | ICD-10-CM | POA: Diagnosis not present

## 2018-03-09 DIAGNOSIS — E1169 Type 2 diabetes mellitus with other specified complication: Secondary | ICD-10-CM | POA: Diagnosis not present

## 2018-03-11 ENCOUNTER — Inpatient Hospital Stay: Payer: Medicare Other | Admitting: Anesthesiology

## 2018-03-11 ENCOUNTER — Other Ambulatory Visit: Payer: Self-pay

## 2018-03-11 ENCOUNTER — Encounter
Admission: EM | Disposition: A | Discharge status: Home / Self Care | Payer: Self-pay | Source: Home / Self Care | Attending: Internal Medicine

## 2018-03-11 ENCOUNTER — Inpatient Hospital Stay
Admission: EM | Admit: 2018-03-11 | Discharge: 2018-03-13 | DRG: 616 | Disposition: A | Discharge status: Home / Self Care | Payer: Medicare Other | Attending: Internal Medicine | Admitting: Internal Medicine

## 2018-03-11 ENCOUNTER — Emergency Department: Payer: Medicare Other

## 2018-03-11 DIAGNOSIS — G9341 Metabolic encephalopathy: Secondary | ICD-10-CM | POA: Diagnosis present

## 2018-03-11 DIAGNOSIS — Z7982 Long term (current) use of aspirin: Secondary | ICD-10-CM

## 2018-03-11 DIAGNOSIS — E860 Dehydration: Secondary | ICD-10-CM | POA: Diagnosis present

## 2018-03-11 DIAGNOSIS — E1169 Type 2 diabetes mellitus with other specified complication: Principal | ICD-10-CM | POA: Diagnosis present

## 2018-03-11 DIAGNOSIS — E1140 Type 2 diabetes mellitus with diabetic neuropathy, unspecified: Secondary | ICD-10-CM | POA: Diagnosis present

## 2018-03-11 DIAGNOSIS — G35 Multiple sclerosis: Secondary | ICD-10-CM | POA: Diagnosis present

## 2018-03-11 DIAGNOSIS — M86172 Other acute osteomyelitis, left ankle and foot: Secondary | ICD-10-CM | POA: Diagnosis present

## 2018-03-11 DIAGNOSIS — R1310 Dysphagia, unspecified: Secondary | ICD-10-CM | POA: Diagnosis not present

## 2018-03-11 DIAGNOSIS — N179 Acute kidney failure, unspecified: Secondary | ICD-10-CM | POA: Diagnosis present

## 2018-03-11 DIAGNOSIS — Z794 Long term (current) use of insulin: Secondary | ICD-10-CM

## 2018-03-11 DIAGNOSIS — E1165 Type 2 diabetes mellitus with hyperglycemia: Secondary | ICD-10-CM | POA: Diagnosis present

## 2018-03-11 DIAGNOSIS — Z9049 Acquired absence of other specified parts of digestive tract: Secondary | ICD-10-CM | POA: Diagnosis not present

## 2018-03-11 DIAGNOSIS — Z888 Allergy status to other drugs, medicaments and biological substances status: Secondary | ICD-10-CM

## 2018-03-11 DIAGNOSIS — R739 Hyperglycemia, unspecified: Secondary | ICD-10-CM | POA: Diagnosis present

## 2018-03-11 DIAGNOSIS — Z885 Allergy status to narcotic agent status: Secondary | ICD-10-CM

## 2018-03-11 DIAGNOSIS — M869 Osteomyelitis, unspecified: Secondary | ICD-10-CM | POA: Diagnosis present

## 2018-03-11 DIAGNOSIS — Z90710 Acquired absence of both cervix and uterus: Secondary | ICD-10-CM | POA: Diagnosis not present

## 2018-03-11 DIAGNOSIS — Z79899 Other long term (current) drug therapy: Secondary | ICD-10-CM | POA: Diagnosis not present

## 2018-03-11 DIAGNOSIS — I10 Essential (primary) hypertension: Secondary | ICD-10-CM | POA: Diagnosis present

## 2018-03-11 DIAGNOSIS — Z96652 Presence of left artificial knee joint: Secondary | ICD-10-CM | POA: Diagnosis present

## 2018-03-11 DIAGNOSIS — E875 Hyperkalemia: Secondary | ICD-10-CM | POA: Diagnosis present

## 2018-03-11 DIAGNOSIS — Z825 Family history of asthma and other chronic lower respiratory diseases: Secondary | ICD-10-CM | POA: Diagnosis not present

## 2018-03-11 DIAGNOSIS — L97529 Non-pressure chronic ulcer of other part of left foot with unspecified severity: Secondary | ICD-10-CM | POA: Diagnosis present

## 2018-03-11 DIAGNOSIS — E11621 Type 2 diabetes mellitus with foot ulcer: Secondary | ICD-10-CM | POA: Diagnosis present

## 2018-03-11 HISTORY — PX: AMPUTATION TOE: SHX6595

## 2018-03-11 LAB — CBC WITH DIFFERENTIAL/PLATELET
Abs Immature Granulocytes: 0.02 10*3/uL (ref 0.00–0.07)
Basophils Absolute: 0 10*3/uL (ref 0.0–0.1)
Basophils Relative: 1 %
Eosinophils Absolute: 0.2 10*3/uL (ref 0.0–0.5)
Eosinophils Relative: 3 %
HEMATOCRIT: 35.4 % — AB (ref 36.0–46.0)
HEMOGLOBIN: 10.7 g/dL — AB (ref 12.0–15.0)
Immature Granulocytes: 0 %
LYMPHS ABS: 0.8 10*3/uL (ref 0.7–4.0)
LYMPHS PCT: 14 %
MCH: 31 pg (ref 26.0–34.0)
MCHC: 30.2 g/dL (ref 30.0–36.0)
MCV: 102.6 fL — ABNORMAL HIGH (ref 80.0–100.0)
Monocytes Absolute: 0.3 10*3/uL (ref 0.1–1.0)
Monocytes Relative: 6 %
NEUTROS ABS: 4.4 10*3/uL (ref 1.7–7.7)
NEUTROS PCT: 76 %
Platelets: 242 10*3/uL (ref 150–400)
RBC: 3.45 MIL/uL — ABNORMAL LOW (ref 3.87–5.11)
RDW: 15.9 % — ABNORMAL HIGH (ref 11.5–15.5)
WBC: 5.8 10*3/uL (ref 4.0–10.5)
nRBC: 0 % (ref 0.0–0.2)

## 2018-03-11 LAB — COMPREHENSIVE METABOLIC PANEL
ALT: 13 U/L (ref 0–44)
AST: 17 U/L (ref 15–41)
Albumin: 3.4 g/dL — ABNORMAL LOW (ref 3.5–5.0)
Alkaline Phosphatase: 50 U/L (ref 38–126)
Anion gap: 9 (ref 5–15)
BILIRUBIN TOTAL: 0.4 mg/dL (ref 0.3–1.2)
BUN: 33 mg/dL — AB (ref 8–23)
CO2: 26 mmol/L (ref 22–32)
Calcium: 9.1 mg/dL (ref 8.9–10.3)
Chloride: 103 mmol/L (ref 98–111)
Creatinine, Ser: 1.68 mg/dL — ABNORMAL HIGH (ref 0.44–1.00)
GFR calc Af Amer: 32 mL/min — ABNORMAL LOW (ref >60)
GFR calc non Af Amer: 28 mL/min — ABNORMAL LOW (ref >60)
GLUCOSE: 190 mg/dL — AB (ref 70–99)
POTASSIUM: 5.3 mmol/L — AB (ref 3.5–5.1)
Sodium: 138 mmol/L (ref 135–145)
TOTAL PROTEIN: 7.8 g/dL (ref 6.5–8.1)

## 2018-03-11 LAB — URINALYSIS, COMPLETE (UACMP) WITH MICROSCOPIC
BILIRUBIN URINE: NEGATIVE
Glucose, UA: NEGATIVE mg/dL
Hgb urine dipstick: NEGATIVE
Ketones, ur: NEGATIVE mg/dL
Nitrite: NEGATIVE
Protein, ur: 30 mg/dL — AB
SQUAMOUS EPITHELIAL / LPF: NONE SEEN (ref 0–5)
Specific Gravity, Urine: 1.015 (ref 1.005–1.030)
WBC, UA: >50 WBC/hpf — ABNORMAL HIGH (ref 0–5)
pH: 7 (ref 5.0–8.0)

## 2018-03-11 LAB — GLUCOSE, CAPILLARY
Glucose-Capillary: 109 mg/dL — ABNORMAL HIGH (ref 70–99)
Glucose-Capillary: 89 mg/dL (ref 70–99)

## 2018-03-11 SURGERY — AMPUTATION, TOE
Anesthesia: General | Site: First Toe | Laterality: Left

## 2018-03-11 MED ORDER — ONDANSETRON HCL 4 MG/2ML IJ SOLN: 4.0000 mg | Freq: Once | INTRAMUSCULAR | Status: DC | PRN

## 2018-03-11 MED ORDER — PIPERACILLIN-TAZOBACTAM 3.375 G IVPB 30 MIN
3.3750 g | Freq: Once | INTRAVENOUS | Status: AC
  Administered 2018-03-11: 3.375 g via INTRAVENOUS
  Filled 2018-03-11: qty 50

## 2018-03-11 MED ORDER — GENTAMICIN SULFATE 40 MG/ML IJ SOLN
INTRAMUSCULAR | Status: AC
  Filled 2018-03-11: qty 2

## 2018-03-11 MED ORDER — PIPERACILLIN-TAZOBACTAM 3.375 G IVPB
3.3750 g | Freq: Three times a day (TID) | INTRAVENOUS | Status: DC
  Administered 2018-03-12 (×3): 3.375 g via INTRAVENOUS
  Filled 2018-03-11 (×3): qty 50

## 2018-03-11 MED ORDER — ENOXAPARIN SODIUM 40 MG/0.4ML ~~LOC~~ SOLN: 40.0000 mg | SUBCUTANEOUS | Status: DC

## 2018-03-11 MED ORDER — VANCOMYCIN HCL 10 G IV SOLR
2000.0000 mg | Freq: Once | INTRAVENOUS | Status: AC
  Administered 2018-03-11: 2000 mg via INTRAVENOUS
  Filled 2018-03-11: qty 2000

## 2018-03-11 MED ORDER — METHADONE HCL 10 MG PO TABS
5.0000 mg | ORAL_TABLET | Freq: Four times a day (QID) | ORAL | Status: DC
  Administered 2018-03-11 – 2018-03-13 (×8): 5 mg via ORAL
  Filled 2018-03-11 (×8): qty 1

## 2018-03-11 MED ORDER — ALBUTEROL SULFATE (2.5 MG/3ML) 0.083% IN NEBU: 2.5000 mg | INHALATION_SOLUTION | RESPIRATORY_TRACT | Status: DC | PRN

## 2018-03-11 MED ORDER — ADULT MULTIVITAMIN LIQUID CH
Freq: Every day | ORAL | Status: DC
  Filled 2018-03-11 (×3): qty 15

## 2018-03-11 MED ORDER — GABAPENTIN 400 MG PO CAPS
400.0000 mg | ORAL_CAPSULE | Freq: Four times a day (QID) | ORAL | Status: DC
  Administered 2018-03-11 – 2018-03-13 (×6): 400 mg via ORAL
  Filled 2018-03-11 (×6): qty 1

## 2018-03-11 MED ORDER — ACETAMINOPHEN 650 MG RE SUPP: 650.0000 mg | Freq: Four times a day (QID) | RECTAL | Status: DC | PRN

## 2018-03-11 MED ORDER — VITAMIN C PO CHEW: 1.0000 | CHEWABLE_TABLET | Freq: Every day | ORAL | Status: DC

## 2018-03-11 MED ORDER — SODIUM CHLORIDE 0.9 % IV SOLN
INTRAVENOUS | Status: DC | PRN
  Administered 2018-03-11: 500 mL

## 2018-03-11 MED ORDER — FLUTICASONE PROPIONATE 50 MCG/ACT NA SUSP
1.0000 | Freq: Every day | NASAL | Status: DC
  Administered 2018-03-12: 1 via NASAL
  Filled 2018-03-11: qty 16

## 2018-03-11 MED ORDER — VITAMIN D 25 MCG (1000 UNIT) PO TABS
4000.0000 [IU] | ORAL_TABLET | Freq: Every day | ORAL | Status: DC
  Administered 2018-03-12: 4000 [IU] via ORAL

## 2018-03-11 MED ORDER — SODIUM CHLORIDE 0.9 % IV SOLN
INTRAVENOUS | Status: AC
  Administered 2018-03-11: 16:00:00 via INTRAVENOUS

## 2018-03-11 MED ORDER — BUPIVACAINE HCL (PF) 0.5 % IJ SOLN
INTRAMUSCULAR | Status: AC
  Filled 2018-03-11: qty 30

## 2018-03-11 MED ORDER — VANCOMYCIN HCL 10 G IV SOLR
1750.0000 mg | INTRAVENOUS | Status: DC
  Filled 2018-03-11: qty 1750

## 2018-03-11 MED ORDER — BENAZEPRIL HCL 20 MG PO TABS
40.0000 mg | ORAL_TABLET | Freq: Every day | ORAL | Status: DC
  Administered 2018-03-12 – 2018-03-13 (×2): 40 mg via ORAL
  Filled 2018-03-11 (×2): qty 2

## 2018-03-11 MED ORDER — BUPIVACAINE HCL 0.5 % IJ SOLN
INTRAMUSCULAR | Status: DC | PRN
  Administered 2018-03-11: 10 mL

## 2018-03-11 MED ORDER — ONDANSETRON HCL 4 MG/2ML IJ SOLN: 4.0000 mg | Freq: Four times a day (QID) | INTRAMUSCULAR | Status: DC | PRN

## 2018-03-11 MED ORDER — PANTOPRAZOLE SODIUM 40 MG PO TBEC
40.0000 mg | DELAYED_RELEASE_TABLET | Freq: Every day | ORAL | Status: DC
  Administered 2018-03-12 – 2018-03-13 (×2): 40 mg via ORAL
  Filled 2018-03-11 (×2): qty 1

## 2018-03-11 MED ORDER — BISACODYL 5 MG PO TBEC
5.0000 mg | DELAYED_RELEASE_TABLET | Freq: Every day | ORAL | Status: DC | PRN
  Administered 2018-03-12: 5 mg via ORAL
  Filled 2018-03-11: qty 1

## 2018-03-11 MED ORDER — MAGNESIUM GLUCONATE 500 MG PO TABS
500.0000 mg | ORAL_TABLET | Freq: Every day | ORAL | Status: DC
  Administered 2018-03-11 – 2018-03-13 (×3): 500 mg via ORAL
  Filled 2018-03-11 (×3): qty 1

## 2018-03-11 MED ORDER — VITAMIN C 500 MG PO TABS
250.0000 mg | ORAL_TABLET | Freq: Every day | ORAL | Status: DC
  Administered 2018-03-12 – 2018-03-13 (×2): 250 mg via ORAL
  Filled 2018-03-11 (×2): qty 1

## 2018-03-11 MED ORDER — GABAPENTIN 400 MG PO CAPS
400.0000 mg | ORAL_CAPSULE | Freq: Four times a day (QID) | ORAL | Status: DC
  Administered 2018-03-11: 400 mg via ORAL
  Filled 2018-03-11: qty 1

## 2018-03-11 MED ORDER — SODIUM CHLORIDE 0.9 % IV BOLUS
1000.0000 mL | Freq: Once | INTRAVENOUS | Status: AC
  Administered 2018-03-11: 1000 mL via INTRAVENOUS

## 2018-03-11 MED ORDER — SENNOSIDES-DOCUSATE SODIUM 8.6-50 MG PO TABS
1.0000 | ORAL_TABLET | Freq: Every evening | ORAL | Status: DC | PRN
  Administered 2018-03-12 (×2): 1 via ORAL
  Filled 2018-03-11 (×2): qty 1

## 2018-03-11 MED ORDER — INSULIN ASPART 100 UNIT/ML ~~LOC~~ SOLN
0.0000 [IU] | Freq: Three times a day (TID) | SUBCUTANEOUS | Status: DC
  Administered 2018-03-12 (×2): 2 [IU] via SUBCUTANEOUS
  Administered 2018-03-13: 1 [IU] via SUBCUTANEOUS
  Filled 2018-03-11 (×3): qty 1

## 2018-03-11 MED ORDER — ONDANSETRON HCL 4 MG/2ML IJ SOLN
INTRAMUSCULAR | Status: DC | PRN
  Administered 2018-03-11: 4 mg via INTRAVENOUS

## 2018-03-11 MED ORDER — DIPHENHYDRAMINE HCL 25 MG PO CAPS
50.0000 mg | ORAL_CAPSULE | Freq: Every evening | ORAL | Status: DC | PRN
  Administered 2018-03-11 – 2018-03-12 (×2): 50 mg via ORAL
  Filled 2018-03-11 (×2): qty 2

## 2018-03-11 MED ORDER — KETOROLAC TROMETHAMINE 15 MG/ML IJ SOLN: 15.0000 mg | Freq: Four times a day (QID) | INTRAMUSCULAR | Status: DC | PRN

## 2018-03-11 MED ORDER — CALCIUM CARBONATE-VITAMIN D 500-200 MG-UNIT PO TABS
1.0000 | ORAL_TABLET | Freq: Every day | ORAL | Status: DC
  Administered 2018-03-12: 1 via ORAL
  Filled 2018-03-11: qty 1

## 2018-03-11 MED ORDER — ONDANSETRON HCL 4 MG PO TABS: 4.0000 mg | ORAL_TABLET | Freq: Four times a day (QID) | ORAL | Status: DC | PRN

## 2018-03-11 MED ORDER — VANCOMYCIN HCL IN DEXTROSE 1-5 GM/200ML-% IV SOLN: 1000.0000 mg | Freq: Once | INTRAVENOUS | Status: DC

## 2018-03-11 MED ORDER — VANCOMYCIN HCL IN DEXTROSE 1-5 GM/200ML-% IV SOLN
1000.0000 mg | Freq: Once | INTRAVENOUS | Status: DC
  Filled 2018-03-11 (×2): qty 200

## 2018-03-11 MED ORDER — PROPOFOL 10 MG/ML IV BOLUS
INTRAVENOUS | Status: DC | PRN
  Administered 2018-03-11: 130 mg via INTRAVENOUS

## 2018-03-11 MED ORDER — INSULIN ASPART 100 UNIT/ML ~~LOC~~ SOLN: 0.0000 [IU] | Freq: Every day | SUBCUTANEOUS | Status: DC

## 2018-03-11 MED ORDER — ENOXAPARIN SODIUM 40 MG/0.4ML ~~LOC~~ SOLN
40.0000 mg | SUBCUTANEOUS | Status: DC
  Administered 2018-03-12 – 2018-03-13 (×2): 40 mg via SUBCUTANEOUS
  Filled 2018-03-11 (×2): qty 0.4

## 2018-03-11 MED ORDER — FERROUS GLUCONATE 324 (38 FE) MG PO TABS
324.0000 mg | ORAL_TABLET | Freq: Every day | ORAL | Status: DC
  Administered 2018-03-12: 324 mg via ORAL
  Filled 2018-03-11 (×3): qty 1

## 2018-03-11 MED ORDER — ACETAMINOPHEN 325 MG PO TABS: 650.0000 mg | ORAL_TABLET | Freq: Four times a day (QID) | ORAL | Status: DC | PRN

## 2018-03-11 MED ORDER — BACID PO TABS
1.0000 | ORAL_TABLET | Freq: Every day | ORAL | Status: DC
  Administered 2018-03-12 – 2018-03-13 (×2): 1 via ORAL
  Filled 2018-03-11 (×3): qty 1

## 2018-03-11 MED ORDER — INSULIN GLARGINE 100 UNIT/ML ~~LOC~~ SOLN: 10.0000 [IU] | Freq: Every evening | SUBCUTANEOUS | Status: DC

## 2018-03-11 MED ORDER — FENTANYL CITRATE (PF) 100 MCG/2ML IJ SOLN: 25.0000 ug | INTRAMUSCULAR | Status: DC | PRN

## 2018-03-11 SURGICAL SUPPLY — 48 items
BANDAGE ACE 4X5 VEL STRL LF (GAUZE/BANDAGES/DRESSINGS) ×3 IMPLANT
BLADE MED AGGRESSIVE (BLADE) ×3 IMPLANT
BLADE OSC/SAGITTAL MD 5.5X18 (BLADE) ×3 IMPLANT
BLADE SURG 15 STRL LF DISP TIS (BLADE) ×2 IMPLANT
BLADE SURG 15 STRL SS (BLADE) ×6
BLADE SURG MINI STRL (BLADE) ×3 IMPLANT
BNDG CONFORM 2 STRL LF (GAUZE/BANDAGES/DRESSINGS) ×3 IMPLANT
BNDG ESMARK 4X12 TAN STRL LF (GAUZE/BANDAGES/DRESSINGS) ×3 IMPLANT
BNDG GAUZE 4.5X4.1 6PLY STRL (MISCELLANEOUS) ×3 IMPLANT
CANISTER SUCT 1200ML W/VALVE (MISCELLANEOUS) ×3 IMPLANT
CLOSURE WOUND 1/4X4 (GAUZE/BANDAGES/DRESSINGS) ×1
COVER WAND RF STERILE (DRAPES) ×1 IMPLANT
CUFF TOURN 18 STER (MISCELLANEOUS) ×3 IMPLANT
CUFF TOURN DUAL PL 12 NO SLV (MISCELLANEOUS) ×1 IMPLANT
DRAPE FLUOR MINI C-ARM 54X84 (DRAPES) ×1 IMPLANT
DURAPREP 26ML APPLICATOR (WOUND CARE) ×3 IMPLANT
ELECT REM PT RETURN 9FT ADLT (ELECTROSURGICAL) ×3
ELECTRODE REM PT RTRN 9FT ADLT (ELECTROSURGICAL) ×1 IMPLANT
GAUZE PETRO XEROFOAM 1X8 (MISCELLANEOUS) ×3 IMPLANT
GAUZE SPONGE 4X4 12PLY STRL (GAUZE/BANDAGES/DRESSINGS) ×3 IMPLANT
GLOVE BIO SURGEON STRL SZ7.5 (GLOVE) ×5 IMPLANT
GLOVE INDICATOR 8.0 STRL GRN (GLOVE) ×5 IMPLANT
GOWN STRL REUS W/ TWL LRG LVL3 (GOWN DISPOSABLE) ×2 IMPLANT
GOWN STRL REUS W/TWL LRG LVL3 (GOWN DISPOSABLE) ×6
HANDPIECE VERSAJET DEBRIDEMENT (MISCELLANEOUS) ×1 IMPLANT
KIT TURNOVER KIT A (KITS) ×3 IMPLANT
LABEL OR SOLS (LABEL) ×3 IMPLANT
NDL FILTER BLUNT 18X1 1/2 (NEEDLE) ×1 IMPLANT
NDL HYPO 25X1 1.5 SAFETY (NEEDLE) ×2 IMPLANT
NEEDLE FILTER BLUNT 18X 1/2SAF (NEEDLE) ×2
NEEDLE FILTER BLUNT 18X1 1/2 (NEEDLE) ×1 IMPLANT
NEEDLE HYPO 25X1 1.5 SAFETY (NEEDLE) ×6 IMPLANT
NS IRRIG 500ML POUR BTL (IV SOLUTION) ×3 IMPLANT
PACK EXTREMITY ARMC (MISCELLANEOUS) ×3 IMPLANT
SOL .9 NS 3000ML IRR  AL (IV SOLUTION) ×2
SOL .9 NS 3000ML IRR AL (IV SOLUTION) ×1
SOL .9 NS 3000ML IRR UROMATIC (IV SOLUTION) ×1 IMPLANT
SOL PREP PVP 2OZ (MISCELLANEOUS) ×3
SOLUTION PREP PVP 2OZ (MISCELLANEOUS) ×1 IMPLANT
STOCKINETTE STRL 6IN 960660 (GAUZE/BANDAGES/DRESSINGS) ×3 IMPLANT
STRIP CLOSURE SKIN 1/4X4 (GAUZE/BANDAGES/DRESSINGS) ×2 IMPLANT
SUT ETHILON 3-0 FS-10 30 BLK (SUTURE) ×3
SUT ETHILON 4-0 (SUTURE) ×3
SUT ETHILON 4-0 FS2 18XMFL BLK (SUTURE) ×1
SUTURE EHLN 3-0 FS-10 30 BLK (SUTURE) ×1 IMPLANT
SUTURE ETHLN 4-0 FS2 18XMF BLK (SUTURE) IMPLANT
SWAB DUAL CULTURE TRANS RED ST (MISCELLANEOUS) ×1 IMPLANT
SYR 10ML LL (SYRINGE) ×3 IMPLANT

## 2018-03-11 NOTE — ED Notes (Signed)
ED TO INPATIENT HANDOFF REPORT  ED Nurse Name and Phone #: Steele Berg  S Name/Age/Gender Stefanie Pham 85 y.o. female Room/Bed: ED11HA/ED11HA  Code Status   Code Status: Prior  Home/SNF/Other Home Patient oriented to: self and place Is this baseline? Yes   Triage Complete: Triage complete  Chief Complaint toe infection  Triage Note Pt arrived from home with AMS r/t possible infection of left big toe. Pt received x ray at primary's office that showed a possible bone infection. Pt A&O to self and place.    Allergies Allergies  Allergen Reactions  . Hydralazine Hcl Other (See Comments)    Severe hyponatremia  . Morphine And Related Shortness Of Breath  . Codeine Nausea And Vomiting    Level of Care/Admitting Diagnosis ED Disposition    ED Disposition Condition Comment   Admit  Hospital Area: [facility] [[id number]]  Level of Care: Med-Surg [16]  Diagnosis: Osteomyelitis [facility]) [[id number]]  Admitting Physician: Shaune Pollack [[id number]]  Attending Physician: Shaune Pollack [[id number]]  Estimated length of stay: past midnight tomorrow  Certification:: I certify this patient will need inpatient services for at least 2 midnights  PT Class (Do Not Modify): Inpatient [101]  PT Acc Code (Do Not Modify): Private [1]       B Medical/Surgery History Past Medical History:  Diagnosis Date  . Diabetes mellitus without complication (HCC)   . Diverticulitis   . Hernia, inguinal   . Hypertension   . Multiple sclerosis (HCC)    Past Surgical History:  Procedure Laterality Date  . ABDOMINAL HYSTERECTOMY    . APPENDECTOMY    . CERVICAL SPINE SURGERY N/A   . CHOLECYSTECTOMY    . HERNIA REPAIR    . ORIF ANKLE FRACTURE Left   . REPLACEMENT TOTAL KNEE Left 2     A IV Location/Drains/Wounds Patient Lines/Drains/Airways Status   Active Line/Drains/Airways    Name:   Placement date:   Placement time:   Site:   Days:   Peripheral IV 03/11/18 Left Antecubital    03/11/18    1218    Antecubital   less than 1          Intake/Output Last 24 hours No intake or output data in the 24 hours ending 03/11/18 1404  Labs/Imaging Results for orders placed or performed during the hospital encounter of 03/11/18 (from the past 48 hour(s))  CBC with Differential/Platelet     Status: Abnormal   Collection Time: 03/11/18 12:17 PM  Result Value Ref Range   WBC 5.8 4.0 - 10.5 K/uL   RBC 3.45 (L) 3.87 - 5.11 MIL/uL   Hemoglobin 10.7 (L) 12.0 - 15.0 g/dL   HCT 69.6 (L) 78.9 - 38.1 %   MCV 102.6 (H) 80.0 - 100.0 fL   MCH 31.0 26.0 - 34.0 pg   MCHC 30.2 30.0 - 36.0 g/dL   RDW 01.7 (H) 51.0 - 25.8 %   Platelets 242 150 - 400 K/uL   nRBC 0.0 0.0 - 0.2 %   Neutrophils Relative % 76 %   Neutro Abs 4.4 1.7 - 7.7 K/uL   Lymphocytes Relative 14 %   Lymphs Abs 0.8 0.7 - 4.0 K/uL   Monocytes Relative 6 %   Monocytes Absolute 0.3 0.1 - 1.0 K/uL   Eosinophils Relative 3 %   Eosinophils Absolute 0.2 0.0 - 0.5 K/uL   Basophils Relative 1 %   Basophils Absolute 0.0 0.0 - 0.1 K/uL   Immature Granulocytes 0 %  Abs Immature Granulocytes 0.02 0.00 - 0.07 K/uL    Comment: Performed at [facility], [address] [place], [place] [address]  Comprehensive metabolic panel     Status: Abnormal   Collection Time: 03/11/18 12:17 PM  Result Value Ref Range   Sodium 138 135 - 145 mmol/L   Potassium 5.3 (H) 3.5 - 5.1 mmol/L   Chloride 103 98 - 111 mmol/L   CO2 26 22 - 32 mmol/L   Glucose, Bld 190 (H) 70 - 99 mg/dL   BUN 33 (H) 8 - 23 mg/dL   Creatinine, Ser 7.821.68 (H) 0.44 - 1.00 mg/dL   Calcium 9.1 8.9 - 95.610.3 mg/dL   Total Protein 7.8 6.5 - 8.1 g/dL   Albumin 3.4 (L) 3.5 - 5.0 g/dL   AST 17 15 - 41 U/L   ALT 13 0 - 44 U/L   Alkaline Phosphatase 50 38 - 126 U/L   Total Bilirubin 0.4 0.3 - 1.2 mg/dL   GFR calc non Af Amer 28 (L) >60 mL/min   GFR calc Af Amer 32 (L) >60 mL/min   Anion gap 9 5 - 15    Comment: Performed at [facility], [address] [place], [place] [address]  Urinalysis, Complete w Microscopic     Status: Abnormal   Collection Time: 03/11/18  1:32 PM  Result Value Ref Range   Color, Urine YELLOW (A) YELLOW   APPearance CLOUDY (A) CLEAR   Specific Gravity, Urine 1.015 1.005 - 1.030   pH 7.0 5.0 - 8.0   Glucose, UA NEGATIVE NEGATIVE mg/dL   Hgb urine dipstick NEGATIVE NEGATIVE   Bilirubin Urine NEGATIVE NEGATIVE   Ketones, ur NEGATIVE NEGATIVE mg/dL   Protein, ur 30 (A) NEGATIVE mg/dL   Nitrite NEGATIVE NEGATIVE   Leukocytes,Ua LARGE (A) NEGATIVE   RBC / HPF 0-5 0 - 5 RBC/hpf   WBC, UA >50 (H) 0 - 5 WBC/hpf   Bacteria, UA RARE (A) NONE SEEN   Squamous Epithelial / LPF NONE SEEN 0 - 5   WBC Clumps PRESENT     Comment: Performed at [facility], [address] [place], [place] [address]   Dg Chest 2 View  Result Date: 03/11/2018 CLINICAL DATA:  Evaluate for pneumonia.  Altered mental status. EXAM: CHEST - 2 VIEW COMPARISON:  None. FINDINGS: The heart size and mediastinal contours are within normal limits. Both lungs are clear. Degenerative changes are noted involving both AC joints. Thoracic spondylosis noted IMPRESSION: 1. No acute cardiopulmonary abnormalities. Electronically Signed   By: Signa Kellaylor  Stroud M.D.   On: 03/11/2018 13:29   Dg Abd 2 Views  Result Date: 03/10/2018 CLINICAL DATA:  85 year old female with nausea and vomiting. Initial encounter. EXAM: ABDOMEN - 2 VIEW COMPARISON:  None. FINDINGS: Moderate stool throughout the colon. No gas distended small bowel loops or free intraperitoneal air. Surgical clips and staples within the pelvis. Post cholecystectomy. Sclerotic focus projecting over the sacrum of indeterminate etiology. Degenerative changes lumbar spine. Mild hip joint degenerative changes. IMPRESSION: 1. Moderate stool throughout the colon. No plain film evidence of bowel obstruction. No free intraperitoneal air. 2. Surgical clips and staples within the pelvis. Prior cholecystectomy. 3.  Nonspecific 2 cm sclerotic focus projects over the sacrum. Electronically Signed   By: Lacy DuverneySteven  Olson M.D.   On: 03/10/2018 07:33   Dg Toe Great Left  Result Date: 03/10/2018 CLINICAL DATA:  85 year old female with left great toe pain for 2-3 weeks. Not sure of initial  injury. Diabetic. Initial encounter. EXAM: LEFT GREAT TOE COMPARISON:  None. FINDINGS: Erosion of the left first distal phalanx tuft with adjacent soft tissue swelling suspicious for osteomyelitis and cellulitis. No acute fracture or dislocation. Possible remote fracture left fifth metatarsal head. IMPRESSION: Erosion of the left first distal phalanx tuft with adjacent soft tissue swelling suspicious for osteomyelitis and cellulitis. These results will be called to the ordering clinician or representative by the Radiologist Assistant, and communication documented in the PACS or zVision Dashboard. Electronically Signed   By: Lacy DuverneySteven  Olson M.D.   On: 03/10/2018 07:29    Pending Labs [employer] Labs (From admission, onward)    Start     Ordered   Signed and Armed forces training and education officerHeld  Basic metabolic panel  Tomorrow morning,   R     Signed and Held   Signed and Held  CBC  Tomorrow morning,   R     Signed and Held   Signed and Held  Creatinine, serum  (enoxaparin (LOVENOX)    CrCl >/= 30 ml/min)  Weekly,   R    Comments:  while on enoxaparin therapy    Signed and Held          Vitals/Pain Today's Vitals   03/11/18 1203 03/11/18 1204 03/11/18 1221 03/11/18 1359  BP: (!) 165/64   138/63  Pulse: 80   77  Resp: 18   18  Temp:   98.3 F (36.8 C)   TempSrc:   Oral   SpO2: 96%   95%  Weight:  99.8 kg    Height:  5\' 6"  (1.676 m)    PainSc:  0-No pain      Isolation Precautions No active isolations  Medications Medications  piperacillin-tazobactam (ZOSYN) IVPB 3.375 g (3.375 g Intravenous New Bag/Given 03/11/18 1356)  vancomycin (VANCOCIN) IVPB 1000 mg/200 mL premix (has no administration in time range)  vancomycin (VANCOCIN) IVPB 1000 mg/200  mL premix (has no administration in time range)  piperacillin-tazobactam (ZOSYN) IVPB 3.375 g (has no administration in time range)  vancomycin (VANCOCIN) 1,750 mg in sodium chloride 0.9 % 500 mL IVPB (has no administration in time range)  sodium chloride 0.9 % bolus 1,000 mL (1,000 mLs Intravenous New Bag/Given 03/11/18 1356)    Mobility non-ambulatory High fall risk   Focused Assessments    R Recommendations: See Admitting Provider Note  Report given to: Asher MuirJamie RN

## 2018-03-11 NOTE — H&P (Addendum)
[facility] NAME: Stefanie Pham    MR#:  [id number]  DATE OF BIRTH:  October 09, 1933  DATE OF ADMISSION:  03/11/2018  PRIMARY CARE PHYSICIAN: Clarisse Gouge, MD   REQUESTING/REFERRING PHYSICIAN: Dr. Quentin Cornwall.  CHIEF COMPLAINT:   Chief Complaint  Patient presents with  . Altered Mental Status   Confusion and worsening left great toe pain for 3 weeks. HISTORY OF PRESENT ILLNESS:  Stefanie Pham  is a 85 y.o. female with a known history of hypertension, diabetes, vertigo diabetes, multiple sclerosis and inguinal hernia.  The patient is sent to ED due to above chief complaints.  The patient has worsening confusion and left great toe pain with discharge for the past 3 weeks.  She denies any fever or chills.  X-ray yesterday showed left great toe osteomyelitis.  ED physician discussed with podiatrist, who planned to get amputation.  The patient is treated with Zosyn and vancomycin in the ED. PAST MEDICAL HISTORY:   Past Medical History:  Diagnosis Date  . Diabetes mellitus without complication ([place])   . Diverticulitis   . Hernia, inguinal   . Hypertension   . Multiple sclerosis ([place])     PAST SURGICAL HISTORY:   Past Surgical History:  Procedure Laterality Date  . ABDOMINAL HYSTERECTOMY    . APPENDECTOMY    . CERVICAL SPINE SURGERY N/A   . CHOLECYSTECTOMY    . HERNIA REPAIR    . ORIF ANKLE FRACTURE Left   . REPLACEMENT TOTAL KNEE Left 2    SOCIAL HISTORY:   Social History   Tobacco Use  . Smoking status: Never Smoker  Substance Use Topics  . Alcohol use: No    FAMILY HISTORY:   Family History  Problem Relation Age of Onset  . COPD Father     DRUG ALLERGIES:   Allergies  Allergen Reactions  . Hydralazine Hcl Other (See Comments)    Severe hyponatremia  . Morphine And Related Shortness Of Breath  . Codeine Nausea And Vomiting    REVIEW OF SYSTEMS:   Review of Systems  Constitutional: Negative for chills,  fever and malaise/fatigue.  HENT: Negative for sore throat.   Eyes: Negative for blurred vision and double vision.  Respiratory: Negative for cough, hemoptysis, shortness of breath, wheezing and stridor.   Cardiovascular: Negative for chest pain, palpitations, orthopnea and leg swelling.  Gastrointestinal: Negative for abdominal pain, blood in stool, diarrhea, melena, nausea and vomiting.  Genitourinary: Negative for dysuria, flank pain and hematuria.  Musculoskeletal: Negative for back pain and joint pain.       Left great toe pain and discharge.  Skin: Negative for rash.  Neurological: Negative for dizziness, sensory change, focal weakness, seizures, loss of consciousness, weakness and headaches.  Endo/Heme/Allergies: Negative for polydipsia.  Psychiatric/Behavioral: Negative for depression. The patient is not nervous/anxious.     MEDICATIONS AT HOME:   Prior to Admission medications   Medication Sig Start Date End Date Taking? Authorizing Provider  amoxicillin-clavulanate (AUGMENTIN) 875-125 MG tablet Take 1 tablet by mouth every 12 (twelve) hours. 03/09/18 03/23/18 Yes [provider]  Ascorbic Acid 125 MG CHEW Chew 125 mg by mouth daily.   Yes [provider]  aspirin EC 81 MG tablet Take 81 mg by mouth at bedtime.   Yes [provider]  benazepril (LOTENSIN) 40 MG tablet Take 40 mg by mouth daily.   Yes [provider]  Bioflavonoid Products (VITAMIN C) CHEW Chew 1 each  by mouth at bedtime.   Yes [provider]  Calcium Carbonate-Vitamin D (CALCIUM 500/D PO) Take 1 tablet by mouth daily.   Yes [provider]  Cholecalciferol (HM VITAMIN D3) 4000 UNITS CAPS Take 1 capsule by mouth at bedtime.   Yes [provider]  diphenhydrAMINE (BENADRYL) 25 mg capsule Take 50 mg by mouth at bedtime as needed for sleep.   Yes [provider]  ferrous gluconate (FERGON) 324 MG tablet Take 1 tablet by mouth daily. 01/21/18  Yes  [provider]  gabapentin (NEURONTIN) 400 MG capsule Take 400 mg by mouth 4 (four) times daily.  12/29/17  Yes [provider]  glimepiride (AMARYL) 4 MG tablet Take 4 mg by mouth daily. 03/09/18 03/09/19 Yes [provider]  Insulin Glargine (LANTUS SOLOSTAR) 100 UNIT/ML Solostar Pen Inject 10 Units into the skin Nightly. 01/19/18 01/21/2019 Yes [provider]  interferon beta-1a (AVONEX) 30 MCG injection Inject 30 mcg into the muscle once a week.   Yes [provider]  Lactobacillus Acid-Pectin (EQL PROBIOTIC ACIDOPHILUS) CAPS Take 1 capsule by mouth daily.   Yes [provider]  methadone (DOLOPHINE) 5 MG tablet Take 5 mg by mouth every 6 (six) hours.   Yes [provider]  mometasone (NASONEX) 50 MCG/ACT nasal spray Place 2 sprays into the nose at bedtime.   Yes [provider]  Multiple Vitamins-Minerals (MULTIVITAMIN PO) Take 2 each by mouth at bedtime.   Yes [provider]  omeprazole (PRILOSEC) 20 MG capsule Take 20 mg by mouth daily.   Yes [provider]  promethazine (PHENERGAN) 12.5 MG tablet Take 12.5 mg by mouth every 6 (six) hours as needed for nausea or vomiting.   Yes [provider]  Pyridoxine HCl (VITAMIN B-6 PO) Take 1 tablet by mouth at bedtime.   Yes [provider]  triamcinolone cream (KENALOG) 0.1 % Apply 1 application topically 2 (two) times daily. 06/30/17 06/30/18 Yes [provider]  amLODipine (NORVASC) 2.5 MG tablet Take 1 tablet (2.5 mg total) by mouth daily. Patient not taking: Reported on 03/11/2018 07/04/14   Gladstone Lighter, MD  docusate sodium (COLACE) 100 MG capsule Take 100-200 mg by mouth 2 (two) times daily as needed for mild constipation.    [provider]  NARCAN 4 MG/0.1ML LIQD nasal spray kit Place 4 mg into the nose once. 10/30/17   [provider]  silver sulfADIAZINE (SILVADENE) 1 % cream Apply 1 application topically 2  (two) times daily. 01/15/18   [provider]      VITAL SIGNS:  Blood pressure (!) 165/64, pulse 80, temperature 98.3 F (36.8 C), temperature source Oral, resp. rate 18, height _0  (1.676 m), weight 99.8 kg, SpO2 96 %.  PHYSICAL EXAMINATION:  Physical Exam  GENERAL:  85 y.o.-year-old patient lying in the bed with no acute distress.  EYES: Pupils equal, round, reactive to light and accommodation. No scleral icterus. Extraocular muscles intact.  HEENT: Head atraumatic, normocephalic. Oropharynx and nasopharynx clear.  NECK:  Supple, no jugular venous distention. No thyroid enlargement, no tenderness.  LUNGS: Normal breath sounds bilaterally, no wheezing, rales,rhonchi or crepitation. No use of accessory muscles of respiration.  CARDIOVASCULAR: S1, S2 normal. No murmurs, rubs, or gallops.  ABDOMEN: Soft, nontender, nondistended. Bowel sounds present. No organomegaly or mass.  EXTREMITIES: No pedal edema, cyanosis, or clubbing.  Left great toe tenderness and swelling with discharge. NEUROLOGIC: Cranial nerves II through XII are intact. Muscle strength 5/5 in  all extremities. Sensation intact. Gait not checked.  PSYCHIATRIC: The patient is alert and oriented x 3.  SKIN: No obvious rash, lesion, or ulcer.   LABORATORY PANEL:   CBC Recent Labs  Lab 03/11/18 1217  WBC 5.8  HGB 10.7*  HCT 35.4*  PLT 242   ------------------------------------------------------------------------------------------------------------------  Chemistries  Recent Labs  Lab 03/11/18 1217  NA 138  K 5.3*  CL 103  CO2 26  GLUCOSE 190*  BUN 33*  CREATININE 1.68*  CALCIUM 9.1  AST 17  ALT 13  ALKPHOS 50  BILITOT 0.4   ------------------------------------------------------------------------------------------------------------------  Cardiac Enzymes No results for input(s): TROPONINI in the last 168  hours. ------------------------------------------------------------------------------------------------------------------  RADIOLOGY:  Dg Abd 2 Views  Result Date: 03/10/2018 CLINICAL DATA:  85 year old female with nausea and vomiting. Initial encounter. EXAM: ABDOMEN - 2 VIEW COMPARISON:  None. FINDINGS: Moderate stool throughout the colon. No gas distended small bowel loops or free intraperitoneal air. Surgical clips and staples within the pelvis. Post cholecystectomy. Sclerotic focus projecting over the sacrum of indeterminate etiology. Degenerative changes lumbar spine. Mild hip joint degenerative changes. IMPRESSION: 1. Moderate stool throughout the colon. No plain film evidence of bowel obstruction. No free intraperitoneal air. 2. Surgical clips and staples within the pelvis. Prior cholecystectomy. 3. Nonspecific 2 cm sclerotic focus projects over the sacrum. Electronically Signed   By: Genia Del M.D.   On: 03/10/2018 07:33   Dg Toe Great Left  Result Date: 03/10/2018 CLINICAL DATA:  85 year old female with left great toe pain for 2-3 weeks. Not sure of initial injury. Diabetic. Initial encounter. EXAM: LEFT GREAT TOE COMPARISON:  None. FINDINGS: Erosion of the left first distal phalanx tuft with adjacent soft tissue swelling suspicious for osteomyelitis and cellulitis. No acute fracture or dislocation. Possible remote fracture left fifth metatarsal head. IMPRESSION: Erosion of the left first distal phalanx tuft with adjacent soft tissue swelling suspicious for osteomyelitis and cellulitis. These results will be called to the ordering clinician or representative by the Radiologist Assistant, and communication documented in the PACS or zVision Dashboard. Electronically Signed   By: Genia Del M.D.   On: 03/10/2018 07:29      IMPRESSION AND PLAN:   Left great toe osteomyelitis. The patient will be admitted to medical floor. Continue Zosyn and vancomycin, follow-up podiatrist for  possible amputation.  Acute renal failure due to dehydration. Normal saline IV and follow-up BMP.  Hyperkalemia.  IV fluid support and follow-up potassium level.  Acute metabolic encephalopathy due to above. Aspiration precaution and fall precaution.  Diabetes.  Start sliding scale and continue Lantus. Hypertension.  Continue home hypertension medication.  All the records are reviewed and case discussed with ED provider. Management plans discussed with the patient, her husband and they are in agreement.  CODE STATUS: Full code  TOTAL TIME TAKING CARE OF THIS PATIENT: 35 minutes.    Demetrios Loll M.D on 03/11/2018 at 1:30 PM  Between 7am to 6pm - Pager - [phone]  After 6pm go to [email or web address] - [employer] [facility]  Office  [phone]  CC: Primary care physician; Clarisse Gouge, MD   Note: This dictation was prepared with Dragon dictation along with smaller phrase technology. Any transcriptional errors that result from this process are unin

## 2018-03-11 NOTE — Anesthesia Preprocedure Evaluation (Signed)
Anesthesia Evaluation  Patient identified by MRN, date of birth, ID band Patient awake    Reviewed: Allergy & Precautions, H&P , NPO status , Patient's Chart, lab work & pertinent test results, reviewed documented beta blocker date and time   History of Anesthesia Complications Negative for: history of anesthetic complications  Airway Mallampati: III  TM Distance: >3 FB Neck ROM: full    Dental  (+) Dental Advidsory Given, Edentulous Upper, Upper Dentures, Partial Lower, Missing Veneers on the bottom:   Pulmonary neg pulmonary ROS,           Cardiovascular Exercise Tolerance: Good hypertension, (-) angina(-) CAD, (-) Past MI, (-) Cardiac Stents and (-) CABG (-) dysrhythmias (-) Valvular Problems/Murmurs     Neuro/Psych neg Seizures  Neuromuscular disease (Multiple Sclerosis) negative psych ROS   GI/Hepatic Neg liver ROS, GERD  Medicated and Controlled,  Endo/Other  negative endocrine ROSdiabetes  Renal/GU negative Renal ROS  negative genitourinary   Musculoskeletal   Abdominal   Peds  Hematology negative hematology ROS (+)   Anesthesia Other Findings Past Medical History: No date: Diabetes mellitus without complication ([facility]) No date: Diverticulitis No date: Hernia, inguinal No date: Hypertension No date: Multiple sclerosis ([facility])   Reproductive/Obstetrics negative OB ROS                             Anesthesia Physical Anesthesia Plan  ASA: III  Anesthesia Plan: General   Post-op Pain Management:    Induction: Intravenous  PONV Risk Score and Plan: 3 and Ondansetron, Dexamethasone and Treatment may vary due to age or medical condition  Airway Management Planned: LMA  Additional Equipment:   Intra-op Plan:   Post-operative Plan: Extubation in OR  Informed Consent: I have reviewed the patients History and Physical, chart, labs and discussed the procedure including the risks,  benefits and alternatives for the proposed anesthesia with the patient or authorized representative who has indicated his/her understanding and acceptance.     Dental Advisory Given  Plan Discussed with: Anesthesiologist, CRNA and Surgeon  Anesthesia Plan Comments:         Anesthesia Quick Evaluation

## 2018-03-11 NOTE — Interval H&P Note (Signed)
History and Physical Interval Note:  03/11/2018 6:20 PM  Stefanie Pham  has presented today for surgery, with the diagnosis of OSTEOMYLITIS  The various methods of treatment have been discussed with the patient and family. After consideration of risks, benefits and other options for treatment, the patient has consented to  Procedure(s): AMPUTATION TOE LEFT GREAT TOE (Left) as a surgical intervention .  The patient's history has been reviewed, patient examined, no change in status, stable for surgery.  I have reviewed the patient's chart and labs.  Questions were answered to the patient's satisfaction.     [name]

## 2018-03-11 NOTE — Consult Note (Signed)
Reason for Consult: Osteomyelitis left great toe Referring Physician: Shakiyla Woodrome is an 85 y.o. female.  HPI: This is an 85 year old female whose had a chronic history of a sore on her left great toe.  Does not really been painful because of her neuropathy.  Recently evaluated by her primary care and sent to the emergency department as she clearly has some osteomyelitis noted on x-ray.  Past Medical History:  Diagnosis Date  . Diabetes mellitus without complication ([facility])   . Diverticulitis   . Hernia, inguinal   . Hypertension   . Multiple sclerosis ([facility])     Past Surgical History:  Procedure Laterality Date  . ABDOMINAL HYSTERECTOMY    . APPENDECTOMY    . CERVICAL SPINE SURGERY N/A   . CHOLECYSTECTOMY    . HERNIA REPAIR    . ORIF ANKLE FRACTURE Left   . REPLACEMENT TOTAL KNEE Left 2    Family History  Problem Relation Age of Onset  . COPD Father     Social History:  reports that she has never smoked. She has never used smokeless tobacco. She reports that she does not drink alcohol or use drugs.  Allergies:  Allergies  Allergen Reactions  . Hydralazine Hcl Other (See Comments)    Severe hyponatremia  . Morphine And Related Shortness Of Breath  . Codeine Nausea And Vomiting    Medications:  Scheduled: . [START ON 03/12/2018] benazepril  40 mg Oral Daily  . calcium-vitamin D  1 tablet Oral Daily  . cholecalciferol  4,000 Units Oral QHS  . enoxaparin (LOVENOX) injection  40 mg Subcutaneous Q24H  . ferrous gluconate  324 mg Oral Daily  . [START ON 03/12/2018] fluticasone  1 spray Each Nare Daily  . gabapentin  400 mg Oral QID  . insulin aspart  0-5 Units Subcutaneous QHS  . insulin aspart  0-9 Units Subcutaneous TID WC  . insulin glargine  10 Units Subcutaneous Nightly  . lactobacillus acidophilus  1 tablet Oral Daily  . magnesium gluconate  500 mg Oral Daily  . methadone  5 mg Oral Q6H  . multivitamin   Oral QHS  . [START ON 03/12/2018] pantoprazole   40 mg Oral Daily  . vitamin C  250 mg Oral Daily    Results for orders placed or performed during the hospital encounter of 03/11/18 (from the past 48 hour(s))  CBC with Differential/Platelet     Status: Abnormal   Collection Time: 03/11/18 12:17 PM  Result Value Ref Range   WBC 5.8 4.0 - 10.5 K/uL   RBC 3.45 (L) 3.87 - 5.11 MIL/uL   Hemoglobin 10.7 (L) 12.0 - 15.0 g/dL   HCT 06.3 (L) 01.6 - 01.0 %   MCV 102.6 (H) 80.0 - 100.0 fL   MCH 31.0 26.0 - 34.0 pg   MCHC 30.2 30.0 - 36.0 g/dL   RDW 93.2 (H) 35.5 - 73.2 %   Platelets 242 150 - 400 K/uL   nRBC 0.0 0.0 - 0.2 %   Neutrophils Relative % 76 %   Neutro Abs 4.4 1.7 - 7.7 K/uL   Lymphocytes Relative 14 %   Lymphs Abs 0.8 0.7 - 4.0 K/uL   Monocytes Relative 6 %   Monocytes Absolute 0.3 0.1 - 1.0 K/uL   Eosinophils Relative 3 %   Eosinophils Absolute 0.2 0.0 - 0.5 K/uL   Basophils Relative 1 %   Basophils Absolute 0.0 0.0 - 0.1 K/uL   Immature Granulocytes 0 %  Abs Immature Granulocytes 0.02 0.00 - 0.07 K/uL    Comment: Performed at [facility], [address] [place], [place] [address]  Comprehensive metabolic panel     Status: Abnormal   Collection Time: 03/11/18 12:17 PM  Result Value Ref Range   Sodium 138 135 - 145 mmol/L   Potassium 5.3 (H) 3.5 - 5.1 mmol/L   Chloride 103 98 - 111 mmol/L   CO2 26 22 - 32 mmol/L   Glucose, Bld 190 (H) 70 - 99 mg/dL   BUN 33 (H) 8 - 23 mg/dL   Creatinine, Ser 6.041.68 (H) 0.44 - 1.00 mg/dL   Calcium 9.1 8.9 - 54.010.3 mg/dL   Total Protein 7.8 6.5 - 8.1 g/dL   Albumin 3.4 (L) 3.5 - 5.0 g/dL   AST 17 15 - 41 U/L   ALT 13 0 - 44 U/L   Alkaline Phosphatase 50 38 - 126 U/L   Total Bilirubin 0.4 0.3 - 1.2 mg/dL   GFR calc non Af Amer 28 (L) >60 mL/min   GFR calc Af Amer 32 (L) >60 mL/min   Anion gap 9 5 - 15    Comment: Performed at [facility], [address] [place], [place] [address]  Urinalysis, Complete w Microscopic     Status: Abnormal   Collection Time:  03/11/18  1:32 PM  Result Value Ref Range   Color, Urine YELLOW (A) YELLOW   APPearance CLOUDY (A) CLEAR   Specific Gravity, Urine 1.015 1.005 - 1.030   pH 7.0 5.0 - 8.0   Glucose, UA NEGATIVE NEGATIVE mg/dL   Hgb urine dipstick NEGATIVE NEGATIVE   Bilirubin Urine NEGATIVE NEGATIVE   Ketones, ur NEGATIVE NEGATIVE mg/dL   Protein, ur 30 (A) NEGATIVE mg/dL   Nitrite NEGATIVE NEGATIVE   Leukocytes,Ua LARGE (A) NEGATIVE   RBC / HPF 0-5 0 - 5 RBC/hpf   WBC, UA >50 (H) 0 - 5 WBC/hpf   Bacteria, UA RARE (A) NONE SEEN   Squamous Epithelial / LPF NONE SEEN 0 - 5   WBC Clumps PRESENT     Comment: Performed at [facility], [address] [place], [place] [address]    Dg Chest 2 View  Result Date: 03/11/2018 CLINICAL DATA:  Evaluate for pneumonia.  Altered mental status. EXAM: CHEST - 2 VIEW COMPARISON:  None. FINDINGS: The heart size and mediastinal contours are within normal limits. Both lungs are clear. Degenerative changes are noted involving both AC joints. Thoracic spondylosis noted IMPRESSION: 1. No acute cardiopulmonary abnormalities. Electronically Signed   By: Signa Kellaylor  Stroud M.D.   On: 03/11/2018 13:29    Review of Systems  Constitutional: Negative for chills and fever.  HENT: Negative for congestion and hearing loss.   Eyes: Negative for blurred vision and double vision.  Respiratory: Negative for cough and shortness of breath.   Cardiovascular: Negative for chest pain and palpitations.  Gastrointestinal: Negative for nausea and vomiting.  Genitourinary: Negative for dysuria and frequency.  Musculoskeletal: Negative for joint pain and myalgias.  Skin:       Patient relates some recent redness and a chronic sore with some drainage on her left great toe  Neurological:       Patient does relate significant neuropathy related to her diabetes and MS.  Endo/Heme/Allergies: Does not bruise/bleed easily.  Psychiatric/Behavioral: Negative for depression and memory loss.    Blood pressure (!) 142/69, pulse 77, temperature 98.2 F (36.8 C), temperature source Oral, resp. rate 18, height 5'  6" (1.676 m), weight 99.8 kg, SpO2 99 %. Physical Exam  Cardiovascular:  DP pulse 2/4 bilateral.  PT pulse 1/4 bilateral.  Musculoskeletal:     Comments: Adequate range of motion of the pedal joints.  Muscle testing deferred.  Neurological:  Loss of protective threshold with a monofilament wire distally in the forefoot and toes.  Skin:  The skin is warm dry and somewhat atrophic.  Some erythema and edema in the left great toe.  Ulceration is noted on the distal aspect of the left hallux approximately 2 cm diameter.  No expressible drainage or fluid.    Assessment/Plan: Assessment: 1.  Osteomyelitis left great toe. 2.  Diabetes with associated neuropathy.  Plan: Discussed with the patient and her family member the need for amputation of the left great toe.  Discussed the procedure that would be involved as well as recovery time.  Discussed possible risks and complications of the procedure including inability to heal due to her diabetes or infection.  Questions invited and answered.  Consent form to be obtained for amputation of the left great toe.  Patient currently n.p.o. and will remain that way.  Plan for surgery later tonight  [name] 03/11/2018, 3:45 PM

## 2018-03-11 NOTE — H&P (View-Only) (Signed)
Reason for Consult: Osteomyelitis left great toe Referring Physician: Shakiyla Pham is an 85 y.o. female.  HPI: This is an 85 year old female whose had a chronic history of a sore on her left great toe.  Does not really been painful because of her neuropathy.  Recently evaluated by her primary care and sent to the emergency department as she clearly has some osteomyelitis noted on x-ray.  Past Medical History:  Diagnosis Date  . Diabetes mellitus without complication (HCC)   . Diverticulitis   . Hernia, inguinal   . Hypertension   . Multiple sclerosis (HCC)     Past Surgical History:  Procedure Laterality Date  . ABDOMINAL HYSTERECTOMY    . APPENDECTOMY    . CERVICAL SPINE SURGERY N/A   . CHOLECYSTECTOMY    . HERNIA REPAIR    . ORIF ANKLE FRACTURE Left   . REPLACEMENT TOTAL KNEE Left 2    Family History  Problem Relation Age of Onset  . COPD Father     Social History:  reports that she has never smoked. She has never used smokeless tobacco. She reports that she does not drink alcohol or use drugs.  Allergies:  Allergies  Allergen Reactions  . Hydralazine Hcl Other (See Comments)    Severe hyponatremia  . Morphine And Related Shortness Of Breath  . Codeine Nausea And Vomiting    Medications:  Scheduled: . [START ON 03/12/2018] benazepril  40 mg Oral Daily  . calcium-vitamin D  1 tablet Oral Daily  . cholecalciferol  4,000 Units Oral QHS  . enoxaparin (LOVENOX) injection  40 mg Subcutaneous Q24H  . ferrous gluconate  324 mg Oral Daily  . [START ON 03/12/2018] fluticasone  1 spray Each Nare Daily  . gabapentin  400 mg Oral QID  . insulin aspart  0-5 Units Subcutaneous QHS  . insulin aspart  0-9 Units Subcutaneous TID WC  . insulin glargine  10 Units Subcutaneous Nightly  . lactobacillus acidophilus  1 tablet Oral Daily  . magnesium gluconate  500 mg Oral Daily  . methadone  5 mg Oral Q6H  . multivitamin   Oral QHS  . [START ON 03/12/2018] pantoprazole   40 mg Oral Daily  . vitamin C  250 mg Oral Daily    Results for orders placed or performed during the hospital encounter of 03/11/18 (from the past 48 hour(s))  CBC with Differential/Platelet     Status: Abnormal   Collection Time: 03/11/18 12:17 PM  Result Value Ref Range   WBC 5.8 4.0 - 10.5 K/uL   RBC 3.45 (L) 3.87 - 5.11 MIL/uL   Hemoglobin 10.7 (L) 12.0 - 15.0 g/dL   HCT 06.3 (L) 01.6 - 01.0 %   MCV 102.6 (H) 80.0 - 100.0 fL   MCH 31.0 26.0 - 34.0 pg   MCHC 30.2 30.0 - 36.0 g/dL   RDW 93.2 (H) 35.5 - 73.2 %   Platelets 242 150 - 400 K/uL   nRBC 0.0 0.0 - 0.2 %   Neutrophils Relative % 76 %   Neutro Abs 4.4 1.7 - 7.7 K/uL   Lymphocytes Relative 14 %   Lymphs Abs 0.8 0.7 - 4.0 K/uL   Monocytes Relative 6 %   Monocytes Absolute 0.3 0.1 - 1.0 K/uL   Eosinophils Relative 3 %   Eosinophils Absolute 0.2 0.0 - 0.5 K/uL   Basophils Relative 1 %   Basophils Absolute 0.0 0.0 - 0.1 K/uL   Immature Granulocytes 0 %  Abs Immature Granulocytes 0.02 0.00 - 0.07 K/uL    Comment: Performed at [facility], [address] [place], [place] [address]  Comprehensive metabolic panel     Status: Abnormal   Collection Time: 03/11/18 12:17 PM  Result Value Ref Range   Sodium 138 135 - 145 mmol/L   Potassium 5.3 (H) 3.5 - 5.1 mmol/L   Chloride 103 98 - 111 mmol/L   CO2 26 22 - 32 mmol/L   Glucose, Bld 190 (H) 70 - 99 mg/dL   BUN 33 (H) 8 - 23 mg/dL   Creatinine, Ser 1.68 (H) 0.44 - 1.00 mg/dL   Calcium 9.1 8.9 - 10.3 mg/dL   Total Protein 7.8 6.5 - 8.1 g/dL   Albumin 3.4 (L) 3.5 - 5.0 g/dL   AST 17 15 - 41 U/L   ALT 13 0 - 44 U/L   Alkaline Phosphatase 50 38 - 126 U/L   Total Bilirubin 0.4 0.3 - 1.2 mg/dL   GFR calc non Af Amer 28 (L) >60 mL/min   GFR calc Af Amer 32 (L) >60 mL/min   Anion gap 9 5 - 15    Comment: Performed at [facility], [address] [place], [place] [address]  Urinalysis, Complete w Microscopic     Status: Abnormal   Collection Time:  03/11/18  1:32 PM  Result Value Ref Range   Color, Urine YELLOW (A) YELLOW   APPearance CLOUDY (A) CLEAR   Specific Gravity, Urine 1.015 1.005 - 1.030   pH 7.0 5.0 - 8.0   Glucose, UA NEGATIVE NEGATIVE mg/dL   Hgb urine dipstick NEGATIVE NEGATIVE   Bilirubin Urine NEGATIVE NEGATIVE   Ketones, ur NEGATIVE NEGATIVE mg/dL   Protein, ur 30 (A) NEGATIVE mg/dL   Nitrite NEGATIVE NEGATIVE   Leukocytes,Ua LARGE (A) NEGATIVE   RBC / HPF 0-5 0 - 5 RBC/hpf   WBC, UA >50 (H) 0 - 5 WBC/hpf   Bacteria, UA RARE (A) NONE SEEN   Squamous Epithelial / LPF NONE SEEN 0 - 5   WBC Clumps PRESENT     Comment: Performed at [facility], [address] [place], [place] [address]    Dg Chest 2 View  Result Date: 03/11/2018 CLINICAL DATA:  Evaluate for pneumonia.  Altered mental status. EXAM: CHEST - 2 VIEW COMPARISON:  None. FINDINGS: The heart size and mediastinal contours are within normal limits. Both lungs are clear. Degenerative changes are noted involving both AC joints. Thoracic spondylosis noted IMPRESSION: 1. No acute cardiopulmonary abnormalities. Electronically Signed   By: Taylor  Stroud M.D.   On: 03/11/2018 13:29    Review of Systems  Constitutional: Negative for chills and fever.  HENT: Negative for congestion and hearing loss.   Eyes: Negative for blurred vision and double vision.  Respiratory: Negative for cough and shortness of breath.   Cardiovascular: Negative for chest pain and palpitations.  Gastrointestinal: Negative for nausea and vomiting.  Genitourinary: Negative for dysuria and frequency.  Musculoskeletal: Negative for joint pain and myalgias.  Skin:       Patient relates some recent redness and a chronic sore with some drainage on her left great toe  Neurological:       Patient does relate significant neuropathy related to her diabetes and MS.  Endo/Heme/Allergies: Does not bruise/bleed easily.  Psychiatric/Behavioral: Negative for depression and memory loss.    Blood pressure (!) 142/69, pulse 77, temperature 98.2 F (36.8 C), temperature source Oral, resp. rate 18, height 5'   6" (1.676 m), weight 99.8 kg, SpO2 99 %. Physical Exam  Cardiovascular:  DP pulse 2/4 bilateral.  PT pulse 1/4 bilateral.  Musculoskeletal:     Comments: Adequate range of motion of the pedal joints.  Muscle testing deferred.  Neurological:  Loss of protective threshold with a monofilament wire distally in the forefoot and toes.  Skin:  The skin is warm dry and somewhat atrophic.  Some erythema and edema in the left great toe.  Ulceration is noted on the distal aspect of the left hallux approximately 2 cm diameter.  No expressible drainage or fluid.    Assessment/Plan: Assessment: 1.  Osteomyelitis left great toe. 2.  Diabetes with associated neuropathy.  Plan: Discussed with the patient and her family member the need for amputation of the left great toe.  Discussed the procedure that would be involved as well as recovery time.  Discussed possible risks and complications of the procedure including inability to heal due to her diabetes or infection.  Questions invited and answered.  Consent form to be obtained for amputation of the left great toe.  Patient currently n.p.o. and will remain that way.  Plan for surgery later tonight  [name] 03/11/2018, 3:45 PM

## 2018-03-11 NOTE — Anesthesia Procedure Notes (Signed)
Procedure Name: LMA Insertion Date/Time: 03/11/2018 8:26 PM Performed by: Waldo Laine, CRNA Pre-anesthesia Checklist: Patient identified, Patient being monitored, Timeout performed, Emergency Drugs available and Suction available Patient Re-evaluated:Patient Re-evaluated prior to induction Oxygen Delivery Method: Circle system utilized Preoxygenation: Pre-oxygenation with 100% oxygen Induction Type: IV induction Ventilation: Mask ventilation without difficulty LMA: LMA inserted LMA Size: 4.0 Tube type: Oral Number of attempts: 1 Placement Confirmation: positive ETCO2 and breath sounds checked- equal and bilateral Tube secured with: Tape Dental Injury: Teeth and Oropharynx as per pre-operative assessment

## 2018-03-11 NOTE — Interval H&P Note (Signed)
History and Physical Interval Note:  03/11/2018 6:21 PM  Stefanie Pham  has presented today for surgery, with the diagnosis of OSTEOMYLITIS  The various methods of treatment have been discussed with the patient and family. After consideration of risks, benefits and other options for treatment, the patient has consented to  Procedure(s): AMPUTATION TOE LEFT GREAT TOE (Left) as a surgical intervention .  The patient's history has been reviewed, patient examined, no change in status, stable for surgery.  I have reviewed the patient's chart and labs.  Questions were answered to the patient's satisfaction.     [name]

## 2018-03-11 NOTE — Consult Note (Signed)
Pharmacy Antibiotic Note  Stefanie Pham is a 85 y.o. female admitted on 03/11/2018 with wound infection.  Pharmacy has been consulted for Vancomycin/Zosyn dosing.  Plan: Zosyn 3.375mg  q 8hr (extended 4 hour infusion)  Will give a total loading dose of Vancomycin IV 2g (2 x 1000mg ) Followed by Vancomycin 1750 mg IV Q 48 hrs. Goal AUC 400-550. Expected AUC: 497 SCr used: 1.68   Height: 5\' 6"  (167.6 cm) Weight: 220 lb (99.8 kg) IBW/kg (Calculated) : 59.3  Temp (24hrs), Avg:98.3 F (36.8 C), Min:98.3 F (36.8 C), Max:98.3 F (36.8 C)  Recent Labs  Lab 03/11/18 1217  WBC 5.8  CREATININE 1.68*    Estimated Creatinine Clearance: 30.8 mL/min (A) (by C-G formula based on SCr of 1.68 mg/dL (H)).    Allergies  Allergen Reactions  . Hydralazine Hcl Other (See Comments)    Severe hyponatremia  . Morphine And Related Shortness Of Breath  . Codeine Nausea And Vomiting    Antimicrobials this admission: Zosyn 2/26 >>  Vancomycin 2/26 >>   Dose adjustments this admission: None  Microbiology results: None at this time  Thank you for allowing pharmacy to be a part of this patient's care.  Albina Billet, PharmD, BCPS Clinical Pharmacist 03/11/2018 1:52 PM

## 2018-03-11 NOTE — Progress Notes (Signed)
Advanced Care Plan.  Purpose of Encounter: CODE STATUS. Parties in Attendance: The patient, her husband and daughter, and me. Patient's Decisional Capacity: Not sure Medical Story: Stefanie Pham  is a 85 y.o. female with a known history of hypertension, diabetes, vertigo diabetes, multiple sclerosis and inguinal hernia.    The patient is being admitted for left great toe osteomyelitis, altered mental status, hyperkalemia and acute renal failure.  I discussed with the patient, her daughter and has been about her current condition, prognosis and CODE STATUS.  The patient is confused. Her husband and daughter want her to be resuscitated and intubated if she has cardiopulmonary rest. Plan:  Code Status: Full Code. Time spent discussing advance care planning: 18 Minutes.

## 2018-03-11 NOTE — Transfer of Care (Signed)
Immediate Anesthesia Transfer of Care Note  Patient: Stefanie Pham  Procedure(s) Performed: AMPUTATION TOE LEFT GREAT TOE (Left First Toe)  Patient Location: PACU  Anesthesia Type:General  Level of Consciousness: awake, alert  and patient cooperative  Airway & Oxygen Therapy: Patient Spontanous Breathing and Patient connected to face mask oxygen  Post-op Assessment: Report given to RN and Post -op Vital signs reviewed and stable  Post vital signs: Reviewed and stable  Last Vitals:  Vitals Value Taken Time  BP 159/93 03/11/2018  9:04 PM  Temp    Pulse 96 03/11/2018  9:05 PM  Resp 24 03/11/2018  9:05 PM  SpO2 100 % 03/11/2018  9:05 PM  Vitals shown include unvalidated device data.  Last Pain:  Vitals:   03/11/18 1440  TempSrc: Oral  PainSc: 5       Patients Stated Pain Goal: 2 (03/11/18 1440)  Complications: No apparent anesthesia complications

## 2018-03-11 NOTE — Anesthesia Post-op Follow-up Note (Signed)
Anesthesia QCDR form completed.        

## 2018-03-11 NOTE — ED Provider Notes (Signed)
[facility] Emergency Department Provider Note    First MD Initiated Contact with Patient 03/11/18 1202     (approximate)  I have reviewed the triage vital signs and the nursing notes.   HISTORY  Chief Complaint Altered Mental Status    HPI Stefanie Pham is a 85 y.o. female presents the ER for evaluation of increasing confusion as well as worsening left great toe pain for 3 weeks with associated purulent drainage redness and discomfort.  She is have a history of diabetes.  Does not currently smoke.  Denies any history of PAD.  Does have history of MS.  Is not been on any recent antibiotics.  Had x-ray done in outpatient clinic yesterday shows evidence of osteomyelitis of the left great toe.    Past Medical History:  Diagnosis Date  . Diabetes mellitus without complication ([place])   . Diverticulitis   . Hernia, inguinal   . Hypertension   . Multiple sclerosis ([place])    Family History  Problem Relation Age of Onset  . COPD Father    Past Surgical History:  Procedure Laterality Date  . ABDOMINAL HYSTERECTOMY    . APPENDECTOMY    . CERVICAL SPINE SURGERY N/A   . CHOLECYSTECTOMY    . HERNIA REPAIR    . ORIF ANKLE FRACTURE Left   . REPLACEMENT TOTAL KNEE Left 2   Patient Active Problem List   Diagnosis Date Noted  . Osteomyelitis ([place]) 03/11/2018  . Hyponatremia 07/01/2014      Prior to Admission medications   Medication Sig Start Date End Date Taking? Authorizing Provider  amoxicillin-clavulanate (AUGMENTIN) 875-125 MG tablet Take 1 tablet by mouth every 12 (twelve) hours. 03/09/18 03/23/18 Yes [provider]  Ascorbic Acid 125 MG CHEW Chew 125 mg by mouth daily.   Yes [provider]  aspirin EC 81 MG tablet Take 81 mg by mouth at bedtime.   Yes [provider]  benazepril (LOTENSIN) 40 MG tablet Take 40 mg by mouth daily.   Yes [provider]  Bioflavonoid Products (VITAMIN C) CHEW Chew 1 each by mouth  at bedtime.   Yes [provider]  Calcium Carbonate-Vitamin D (CALCIUM 500/D PO) Take 1 tablet by mouth daily.   Yes [provider]  Cholecalciferol (HM VITAMIN D3) 4000 UNITS CAPS Take 1 capsule by mouth at bedtime.   Yes [provider]  diphenhydrAMINE (BENADRYL) 25 mg capsule Take 50 mg by mouth at bedtime as needed for sleep.   Yes [provider]  ferrous gluconate (FERGON) 324 MG tablet Take 1 tablet by mouth daily. 01/21/18  Yes [provider]  gabapentin (NEURONTIN) 400 MG capsule Take 400 mg by mouth 4 (four) times daily.  12/29/17  Yes [provider]  glimepiride (AMARYL) 4 MG tablet Take 4 mg by mouth daily. 03/09/18 03/09/19 Yes [provider]  Insulin Glargine (LANTUS SOLOSTAR) 100 UNIT/ML Solostar Pen Inject 10 Units into the skin Nightly. 01/19/18 02/06/2019 Yes [provider]  interferon beta-1a (AVONEX) 30 MCG injection Inject 30 mcg into the muscle once a week.   Yes [provider]  Lactobacillus Acid-Pectin (EQL PROBIOTIC ACIDOPHILUS) CAPS Take 1 capsule by mouth daily.   Yes [provider]  methadone (DOLOPHINE) 5 MG tablet Take 5 mg by mouth every 6 (six) hours.   Yes [provider]  mometasone (NASONEX) 50 MCG/ACT nasal spray Place 2 sprays into the nose at bedtime.   Yes [provider]  Multiple Vitamins-Minerals (MULTIVITAMIN PO) Take 2 each by mouth at bedtime.   Yes [provider]  omeprazole (PRILOSEC) 20 MG capsule Take 20 mg by mouth daily.   Yes [provider]  promethazine (PHENERGAN) 12.5 MG tablet Take 12.5 mg by mouth every 6 (six) hours as needed for nausea or vomiting.   Yes [provider]  Pyridoxine HCl (VITAMIN B-6 PO) Take 1 tablet by mouth at bedtime.   Yes [provider]  triamcinolone cream (KENALOG) 0.1 % Apply 1 application topically 2 (two) times daily. 06/30/17 06/30/18 Yes [provider]    amLODipine (NORVASC) 2.5 MG tablet Take 1 tablet (2.5 mg total) by mouth daily. Patient not taking: Reported on 03/11/2018 07/04/14   Gladstone Lighter, MD  docusate sodium (COLACE) 100 MG capsule Take 100-200 mg by mouth 2 (two) times daily as needed for mild constipation.    [provider]  NARCAN 4 MG/0.1ML LIQD nasal spray kit Place 4 mg into the nose once. 10/30/17   [provider]  silver sulfADIAZINE (SILVADENE) 1 % cream Apply 1 application topically 2 (two) times daily. 01/15/18   [provider]    Allergies Hydralazine hcl; Morphine and related; and Codeine    Social History Social History   Tobacco Use  . Smoking status: Never Smoker  Substance Use Topics  . Alcohol use: No  . Drug use: No    Review of Systems Patient denies headaches, rhinorrhea, blurry vision, numbness, shortness of breath, chest pain, edema, cough, abdominal pain, nausea, vomiting, diarrhea, dysuria, fevers, rashes or hallucinations unless otherwise stated above in HPI. ____________________________________________   PHYSICAL EXAM:  VITAL SIGNS: Vitals:   03/11/18 1359 03/11/18 1440  BP: 138/63 (!) 142/69  Pulse: 77 77  Resp: 18 18  Temp:  98.2 F (36.8 C)  SpO2: 95% 99%    Constitutional: Alert and oriented. Frail  Eyes: Conjunctivae are normal.  Head: Atraumatic. Nose: No congestion/rhinnorhea. Mouth/Throat: Mucous membranes are moist.   Neck: No stridor. Painless ROM.  Cardiovascular: Normal rate, regular rhythm. Grossly normal heart sounds.  Good peripheral circulation. Respiratory: Normal respiratory effort.  No retractions. Lungs CTAB. Gastrointestinal: Soft and nontender. No distention. No abdominal bruits. No CVA tenderness. Genitourinary:  Musculoskeletal: No lower extremity tenderness nor edema. Left great toe with purulence and cellulitis streaking proximally.  2+ DP pulse  No joint effusions. Neurologic:  Normal speech and language. No gross  focal neurologic deficits are appreciated. No facial droop Skin:  Skin is warm, dry and intact. No rash noted. Psychiatric: Mood and affect are normal. Speech and behavior are normal.  ____________________________________________   LABS (all labs ordered are listed, but only abnormal results are displayed)  Results for orders placed or performed during the hospital encounter of 03/11/18 (from the past 24 hour(s))  CBC with Differential/Platelet     Status: Abnormal   Collection Time: 03/11/18 12:17 PM  Result Value Ref Range   WBC 5.8 4.0 - 10.5 K/uL   RBC 3.45 (L) 3.87 - 5.11 MIL/uL   Hemoglobin 10.7 (L) 12.0 - 15.0 g/dL   HCT 35.4 (L) 36.0 - 46.0 %   MCV 102.6 (H) 80.0 - 100.0 fL   MCH 31.0 26.0 - 34.0 pg   MCHC 30.2 30.0 - 36.0 g/dL   RDW 15.9 (H) 11.5 - 15.5 %   Platelets 242 150 - 400 K/uL   nRBC 0.0 0.0 - 0.2 %   Neutrophils Relative % 76 %   Neutro Abs 4.4  1.7 - 7.7 K/uL   Lymphocytes Relative 14 %   Lymphs Abs 0.8 0.7 - 4.0 K/uL   Monocytes Relative 6 %   Monocytes Absolute 0.3 0.1 - 1.0 K/uL   Eosinophils Relative 3 %   Eosinophils Absolute 0.2 0.0 - 0.5 K/uL   Basophils Relative 1 %   Basophils Absolute 0.0 0.0 - 0.1 K/uL   Immature Granulocytes 0 %   Abs Immature Granulocytes 0.02 0.00 - 0.07 K/uL  Comprehensive metabolic panel     Status: Abnormal   Collection Time: 03/11/18 12:17 PM  Result Value Ref Range   Sodium 138 135 - 145 mmol/L   Potassium 5.3 (H) 3.5 - 5.1 mmol/L   Chloride 103 98 - 111 mmol/L   CO2 26 22 - 32 mmol/L   Glucose, Bld 190 (H) 70 - 99 mg/dL   BUN 33 (H) 8 - 23 mg/dL   Creatinine, Ser 1.68 (H) 0.44 - 1.00 mg/dL   Calcium 9.1 8.9 - 10.3 mg/dL   Total Protein 7.8 6.5 - 8.1 g/dL   Albumin 3.4 (L) 3.5 - 5.0 g/dL   AST 17 15 - 41 U/L   ALT 13 0 - 44 U/L   Alkaline Phosphatase 50 38 - 126 U/L   Total Bilirubin 0.4 0.3 - 1.2 mg/dL   GFR calc non Af Amer 28 (L) >60 mL/min   GFR calc Af Amer 32 (L) >60 mL/min   Anion gap 9 5 - 15   Urinalysis, Complete w Microscopic     Status: Abnormal   Collection Time: 03/11/18  1:32 PM  Result Value Ref Range   Color, Urine YELLOW (A) YELLOW   APPearance CLOUDY (A) CLEAR   Specific Gravity, Urine 1.015 1.005 - 1.030   pH 7.0 5.0 - 8.0   Glucose, UA NEGATIVE NEGATIVE mg/dL   Hgb urine dipstick NEGATIVE NEGATIVE   Bilirubin Urine NEGATIVE NEGATIVE   Ketones, ur NEGATIVE NEGATIVE mg/dL   Protein, ur 30 (A) NEGATIVE mg/dL   Nitrite NEGATIVE NEGATIVE   Leukocytes,Ua LARGE (A) NEGATIVE   RBC / HPF 0-5 0 - 5 RBC/hpf   WBC, UA >50 (H) 0 - 5 WBC/hpf   Bacteria, UA RARE (A) NONE SEEN   Squamous Epithelial / LPF NONE SEEN 0 - 5   WBC Clumps PRESENT    ____________________________________________  EKG My review and personal interpretation at Time: 12:38   Indication: diabteic foot  Rate: 60  Rhythm: sinus Axis: normal Other: nonspecifc t wave abnmalitym, no stemi ____________________________________________  RADIOLOGY I personally reviewed all radiographic images ordered to evaluate for the above acute complaints and reviewed radiology reports and findings.  These findings were personally discussed with the patient.  Please see medical record for radiology report.  ____________________________________________   PROCEDURES  Procedure(s) performed:  .Critical Care Performed by: Merlyn Lot, MD Authorized by: Merlyn Lot, MD   Critical care provider statement:    Critical care time (minutes):  30   Critical care time was exclusive of:  Separately billable procedures and treating other patients   Critical care was necessary to treat or prevent imminent or life-threatening deterioration of the following conditions:  Renal failure and metabolic crisis   Critical care was time spent personally by me on the following activities:  Development of treatment plan with patient or surrogate, discussions with consultants, evaluation of patient's response to treatment,  examination of patient, obtaining history from patient or surrogate, ordering and performing treatments and interventions, ordering and review of laboratory  studies, ordering and review of radiographic studies, pulse oximetry, re-evaluation of patient's condition and review of old [place] performed: yes ____________________________________________   INITIAL IMPRESSION / ASSESSMENT AND PLAN / ED COURSE  Pertinent labs & imaging results that were available during my care of the patient were reviewed by me and considered in my medical decision making (see chart for details).   DDX: diabetic foot, cellulitis, uti, limb ischemia  Stefanie Pham is a 85 y.o. who presents to the ED with symptoms as described above.  Patient with markedly infected appearing great toe.  X-ray from outpatient clinic showing evidence of probable osteo-.  Patient is a diabetic but not overtly septic at this time.  Will give IV antibiotics and consult with podiatry.  Clinical Course as of Mar 12 1455  Wed Mar 11, 2018  1303 Discussed the case with Dr. Cleda Mccreedy of podiatry.  Based on osteomyelitis will admit patient for IV fluids as well as IV antibiotics.  Does have mild hyperkalemia with AKI.  Will give IV fluids.  No history of congestive heart failure.   [PR]    Clinical Course User Index [PR] Merlyn Lot, MD     As part of my medical decision making, I reviewed the following data within the [facility] notes reviewed and incorporated, Labs reviewed, notes from prior ED visits and  Controlled Substance Database   ____________________________________________   FINAL CLINICAL IMPRESSION(S) / ED DIAGNOSES  Final diagnoses:  Osteomyelitis of ankle or foot, acute, left ([place])  Hyperglycemia      NEW MEDICATIONS STARTED DURING THIS VISIT:  Current Discharge Medication List       Note:  This document was prepared using Dragon voice recognition software  and may include unintentional dictation errors.    Merlyn Lot, MD 03/11/18 [phone]

## 2018-03-11 NOTE — Op Note (Signed)
Date of operation: 03/11/2018  Surgeon: [name] D.P.M.  Preoperative diagnosis: Osteomyelitis left great toe.  Postoperative diagnosis: Same.  Procedure: Partial amputation left great toe.  Anesthesia: LMA with local.  Hemostasis: Pneumatic tourniquet left ankle 250 mmHg.  Estimated blood loss: Less than 5 cc.  Pathology: Left great toe.  Cultures: Bone cultures distal phalanx left great toe.  Complications: None apparent.  Operative indications: This is an 85 year old female with recent development of ulceration on her left great toe.  Was evaluated outpatient and x-rays showed evidence for osteomyelitis and the patient was admitted for IV antibiotics and surgical management.  Operative procedure: Patient was taken to the operating room and placed on the table in the supine position.  Following satisfactory LMA anesthesia the left forefoot was anesthetized with 10 cc of 0.5% bupivacaine plain around the first metatarsal.  A pneumatic tourniquet was applied at the level of the left ankle and the foot was prepped and draped in the usual sterile fashion.  The foot was exsanguinated and the tourniquet inflated to 250 mmHg.     Attention was then directed to the distal aspect of the left foot where a fishmouth type incision was made coursing medial to lateral on the hallux at about the level of the IPJ.  The incision was deepened via sharp dissection down to the level of the bone and dissection carried back proximally.  Using a sagittal saw the head of the proximal phalanx was transected and the distal aspect of the great toe was removed in toto.  The wound was flushed with copious amounts of sterile saline and closed using 3-0 nylon vertical mattress and simple interrupted sutures.  Xeroform 4 x 4's and con form applied.  Tourniquet was released.  Kerlix and Ace wrap applied.  Patient tolerated the procedure and anesthesia well and was transported to the PACU with vital signs stable and in  good condition.

## 2018-03-11 NOTE — ED Triage Notes (Addendum)
Pt arrived from home with AMS r/t possible infection of left big toe. Pt received x ray at primary's office that showed a possible bone infection. Pt A&O to self and place.

## 2018-03-12 ENCOUNTER — Encounter: Payer: Self-pay | Admitting: Podiatry

## 2018-03-12 DIAGNOSIS — R1310 Dysphagia, unspecified: Secondary | ICD-10-CM

## 2018-03-12 LAB — CBC
HCT: 29.3 % — ABNORMAL LOW (ref 36.0–46.0)
HEMOGLOBIN: 9.1 g/dL — AB (ref 12.0–15.0)
MCH: 31.9 pg (ref 26.0–34.0)
MCHC: 31.1 g/dL (ref 30.0–36.0)
MCV: 102.8 fL — ABNORMAL HIGH (ref 80.0–100.0)
Platelets: 194 10*3/uL (ref 150–400)
RBC: 2.85 MIL/uL — ABNORMAL LOW (ref 3.87–5.11)
RDW: 15.9 % — ABNORMAL HIGH (ref 11.5–15.5)
WBC: 4.7 10*3/uL (ref 4.0–10.5)
nRBC: 0 % (ref 0.0–0.2)

## 2018-03-12 LAB — BASIC METABOLIC PANEL
Anion gap: 6 (ref 5–15)
BUN: 25 mg/dL — ABNORMAL HIGH (ref 8–23)
CO2: 25 mmol/L (ref 22–32)
Calcium: 8.2 mg/dL — ABNORMAL LOW (ref 8.9–10.3)
Chloride: 108 mmol/L (ref 98–111)
Creatinine, Ser: 1.28 mg/dL — ABNORMAL HIGH (ref 0.44–1.00)
GFR calc Af Amer: 45 mL/min — ABNORMAL LOW (ref >60)
GFR calc non Af Amer: 39 mL/min — ABNORMAL LOW (ref >60)
GLUCOSE: 98 mg/dL (ref 70–99)
Potassium: 4.9 mmol/L (ref 3.5–5.1)
Sodium: 139 mmol/L (ref 135–145)

## 2018-03-12 LAB — GLUCOSE, CAPILLARY
GLUCOSE-CAPILLARY: 172 mg/dL — AB (ref 70–99)
Glucose-Capillary: 79 mg/dL (ref 70–99)
Glucose-Capillary: 99 mg/dL (ref 70–99)
Glucose-Capillary: 166 mg/dL — ABNORMAL HIGH (ref 70–99)
Glucose-Capillary: 111 mg/dL — ABNORMAL HIGH (ref 70–99)

## 2018-03-12 MED ORDER — BISACODYL 10 MG RE SUPP
10.0000 mg | Freq: Once | RECTAL | Status: AC
  Administered 2018-03-12: 10 mg via RECTAL
  Filled 2018-03-12: qty 1

## 2018-03-12 MED ORDER — INSULIN GLARGINE 100 UNIT/ML ~~LOC~~ SOLN
10.0000 [IU] | Freq: Every day | SUBCUTANEOUS | Status: DC
  Administered 2018-03-12: 10 [IU] via SUBCUTANEOUS
  Filled 2018-03-12 (×2): qty 0.1

## 2018-03-12 NOTE — Evaluation (Signed)
Clinical/Bedside Swallow Evaluation Patient Details  Name: Stefanie Pham MRN: [id number] Date of Birth: [birth year]  Today's Date: 03/12/2018 Time: SLP Start Time (ACUTE ONLY): 1130 SLP Stop Time (ACUTE ONLY): 1200 SLP Time Calculation (min) (ACUTE ONLY): 30 min  Past Medical History:  Past Medical History:  Diagnosis Date  . Diabetes mellitus without complication (HCC)   . Diverticulitis   . Hernia, inguinal   . Hypertension   . Multiple sclerosis (HCC)    Past Surgical History:  Past Surgical History:  Procedure Laterality Date  . ABDOMINAL HYSTERECTOMY    . AMPUTATION TOE Left 03/11/2018   Procedure: AMPUTATION TOE LEFT GREAT TOE;  Surgeon: Linus Galas, DPM;  Location: [facility] ORS;  Service: Podiatry;  Laterality: Left;  . APPENDECTOMY    . CERVICAL SPINE SURGERY N/A   . CHOLECYSTECTOMY    . HERNIA REPAIR    . ORIF ANKLE FRACTURE Left   . REPLACEMENT TOTAL KNEE Left 2   HPI:  Per admitting H&P:Stefanie Pham  is a 85 y.o. female with a known history of hypertension, diabetes, vertigo diabetes, multiple sclerosis and inguinal hernia.  The patient is sent to ED due to above chief complaints.  The patient has worsening confusion and left great toe pain with discharge for the past 3 weeks.  She denies any fever or chills.  X-ray yesterday showed left great toe osteomyelitis.  ED physician discussed with podiatrist, who planned to get amputation.  The patient is treated with Zosyn and vancomycin in the ED.   Assessment / Plan / Recommendation Clinical Impression  Nsg noted pt w/sensation of "food sticking in her throat and feeling like it is hung up" and needing to regurgitate small amount of fruit she attempted to eat this morning at breakfast. Nsg held further PO's and notified requested orders for BSE to further evaluate. Upon evaluation, this very pleasant 85 y/o female appears to present with functional swallowing abilities at bedside. No overt s/s aspiration observed with any  consistency tested (thin liquid, puree, & solids). Oral phase and oral mech exam WFL; however it is remarkable to note pt demonstrates severe lingual dryness. Adequate oral prep/coordination and A-P transit time was observed with all consistencies tested (thin, puree, soft solid). No oral residue observed. No overt s/s pharyngeal dysphagia observed. Swallow initiation appeared timely. Vocal quality remained clear thoughout evaluation. It is SIGNIFICANT to note: Pt and husband report  hx of repeated ESOPHAGEAL DILITATION over the years. Current presentation is more consistent with s/s esophageal dysphagia. Rec GI CONSULT to evaluate and assess possible esophageal dysphagia.  Educated pt and husband re: aspiration precautions and safe swallow recommendations to promote esophageal clearance including: alternating 1 bite solid with 1 sip liquid, eating small frequent meals throughout the day as opposed to 3 large meals, small bites and sips, eating slowly, and sitting fully upright when eating and for up to 90 minutes after meals. Pt and husband stated agreement. Recommend continue with current regular diet with thin liquids, rec. give meds whole with puree. Discussed results of evaluation with nursing, nursing in agreement. SLP to f/u with toleration of diet and sign off if pt presents with no further needs identified. SLP Visit Diagnosis: Dysphagia, unspecified (R13.10)    Aspiration Risk  Mild aspiration risk    Diet Recommendation Regular;Thin liquid   Liquid Administration via: Cup;Straw Medication Administration: Whole meds with puree Supervision: Staff to assist with self feeding Compensations: Minimize environmental distractions;Slow rate;Small sips/bites;Follow solids with liquid Postural Changes: Seated upright at  90 degrees;Remain upright for at least 30 minutes after po intake    Other  Recommendations Recommended Consults: Consider GI evaluation;Consider esophageal assessment Oral Care  Recommendations: Oral care before and after PO   Follow up Recommendations None      Frequency and Duration min 1 x/week  1 week       Prognosis Prognosis for Safe Diet Advancement: Good Barriers to Reach Goals: Severity of deficits      Swallow Study   General Date of Onset: 03/12/18 HPI: Per admitting H&P:Stefanie Pham  is a 85 y.o. female with a known history of hypertension, diabetes, vertigo diabetes, multiple sclerosis and inguinal hernia.  The patient is sent to ED due to above chief complaints.  The patient has worsening confusion and left great toe pain with discharge for the past 3 weeks.  She denies any fever or chills.  X-ray yesterday showed left great toe osteomyelitis.  ED physician discussed with podiatrist, who planned to get amputation.  The patient is treated with Zosyn and vancomycin in the ED. Type of Study: Bedside Swallow Evaluation Diet Prior to this Study: Regular;Thin liquids Temperature Spikes Noted: No Respiratory Status: Room air History of Recent Intubation: No Behavior/Cognition: Alert;Cooperative;Pleasant mood Oral Cavity Assessment: Erythema;Dry Oral Care Completed by SLP: No Oral Cavity - Dentition: Dentures, top;Dentures, bottom Vision: Functional for self-feeding Self-Feeding Abilities: Needs assist Patient Positioning: Upright in bed Baseline Vocal Quality: Normal Volitional Cough: Strong Volitional Swallow: Able to elicit    Oral/Motor/Sensory Function Overall Oral Motor/Sensory Function: Within functional limits   Ice Chips Ice chips: Within functional limits Presentation: Spoon   Thin Liquid Thin Liquid: Within functional limits Presentation: Cup;Self Fed;Straw    Nectar Thick Nectar Thick Liquid: Not tested   Honey Thick Honey Thick Liquid: Not tested   Puree Puree: Within functional limits Presentation: Self Fed;Spoon   Solid     Solid: Within functional limits Presentation: Self Fed      Brandonlee Navis, MA,  CCC-SLP 03/12/2018,12:19 PM

## 2018-03-12 NOTE — Care Management Note (Signed)
Case Management Note  Patient Details  Name: Senta Kantor MRN: [id number] Date of Birth: [birth year]  Subjective/Objective:                  Met with the patient and family to discuss DC needs and plan The patient needs a BSC and RW, noified Jason with [facility] thru [employer] is not to have the dressing changed until she goes back to the surgeon for a follow up No HH services needed Pharmacy is [facility] [place] PCP is Vickki Muff Patient lives with husband and daughter is close and checks on her daily    Action/Plan:  Set up RW and 3 in 1 for patient, no HH services needed  Expected Discharge Date:                  Expected Discharge Plan:     In-House Referral:     Discharge planning Services  CM Consult  Post Acute Care Choice:    Choice offered to:     DME Arranged:  3-N-1, [place] rolling DME Agency:  [place]:    [facility] Agency:     Status of Service:     If discussed at [employer], dates discussed:    Additional Comments:  Su Hilt, RN 03/12/2018, 10:29 AM

## 2018-03-12 NOTE — Anesthesia Postprocedure Evaluation (Signed)
Anesthesia Post Note  Patient: Stefanie Pham  Procedure(s) Performed: AMPUTATION TOE LEFT GREAT TOE (Left First Toe)  Patient location during evaluation: PACU Anesthesia Type: General Level of consciousness: awake and alert Pain management: pain level controlled Vital Signs Assessment: post-procedure vital signs reviewed and stable Respiratory status: spontaneous breathing, nonlabored ventilation, respiratory function stable and patient connected to nasal cannula oxygen Cardiovascular status: blood pressure returned to baseline and stable Postop Assessment: no apparent nausea or vomiting Anesthetic complications: no     Last Vitals:  Vitals:   03/12/18 0429 03/12/18 0731  BP: (!) 105/52 (!) 119/53  Pulse: (!) 55 (!) 55  Resp: 20 15  Temp:  36.7 C  SpO2: 99% 100%    Last Pain:  Vitals:   03/12/18 0731  TempSrc: Oral  PainSc:                  Lenard Simmer

## 2018-03-12 NOTE — Progress Notes (Signed)
[employer] [facility] - [place] at [facility]   PATIENT NAME: Stefanie Pham    MR#:  [id number]  DATE OF BIRTH:  Apr 24, 1933  SUBJECTIVE:  CHIEF COMPLAINT:   Chief Complaint  Patient presents with  . Altered Mental Status   Patient status post partial amputation of left great toe last night due to diagnosis of osteomyelitis.  Reevaluated by podiatrist this morning and cleared for discharge from podiatry standpoint.  Later notified by nursing staff that patient having difficulty with swallowing this morning.  Seen by speech therapist who evaluated patient and requested for GI consultation for esophageal dilatation.  Patient reported to have had prior esophageal dilatations in the past.  No fevers. Family members including husband and daughter as well as son updated on treatment plans this morning.  REVIEW OF SYSTEMS:  Review of Systems  Constitutional: Negative for chills, fever and weight loss.  HENT: Negative for hearing loss and tinnitus.   Eyes: Negative for blurred vision and double vision.  Respiratory: Negative for cough, hemoptysis and sputum production.   Cardiovascular: Negative for chest pain and palpitations.  Gastrointestinal: Negative for heartburn, nausea and vomiting.       Difficulty swallowing  Genitourinary: Negative for dysuria and urgency.  Musculoskeletal: Negative for myalgias and neck pain.  Skin: Negative for itching and rash.  Neurological: Negative for dizziness and headaches.  Psychiatric/Behavioral: Negative for depression and substance abuse.      DRUG ALLERGIES:   Allergies  Allergen Reactions  . Hydralazine Hcl Other (See Comments)    Severe hyponatremia  . Morphine And Related Shortness Of Breath  . Codeine Nausea And Vomiting   VITALS:  Blood pressure 130/63, pulse 88, temperature 98.8 F (37.1 C), temperature source Oral, resp. rate 15, height 5\' 6"  (1.676 m), weight 99.8 kg, SpO2 95 %. PHYSICAL EXAMINATION:   Physical Exam    Constitutional: She is oriented to person, place, and time. She appears well-developed and well-nourished.  HENT:  Head: Normocephalic and atraumatic.  Eyes: Pupils are equal, round, and reactive to light. Conjunctivae and EOM are normal. Right eye exhibits no discharge.  Neck: Normal range of motion. Neck supple. No tracheal deviation present.  Cardiovascular: Normal rate, regular rhythm and normal heart sounds.  Respiratory: Effort normal and breath sounds normal. She has no wheezes.  GI: Soft. Bowel sounds are normal. There is no abdominal tenderness.  Musculoskeletal:     Comments: Dressing in place to left foot status post recent partial amputation of great toe  Neurological: She is alert and oriented to person, place, and time. She has normal reflexes.  Skin: Skin is warm. No erythema.  Psychiatric: She has a normal mood and affect. Her behavior is normal.   LABORATORY PANEL:  Female CBC Recent Labs  Lab 03/12/18 0407  WBC 4.7  HGB 9.1*  HCT 29.3*  PLT 194   ------------------------------------------------------------------------------------------------------------------ Chemistries  Recent Labs  Lab 03/11/18 1217 03/12/18 0407  NA 138 139  K 5.3* 4.9  CL 103 108  CO2 26 25  GLUCOSE 190* 98  BUN 33* 25*  CREATININE 1.68* 1.28*  CALCIUM 9.1 8.2*  AST 17  --   ALT 13  --   ALKPHOS 50  --   BILITOT 0.4  --    RADIOLOGY:  No results found. ASSESSMENT AND PLAN:    1. Left great toe osteomyelitis. The patient will be admitted to medical floor. Patient started empirically on broad-spectrum IV antibiotics with vancomycin and Zosyn. Patient seen by  podiatrist.  Status post partial amputation of left great toe. Betadine and a sterile gauze bandage reapplied to the left foot by podiatrist.   Plan is for discharge on p.o. Augmentin for 1 week when medically ready for discharge  Follow-up with podiatrist in 1 week.  Weightbearing as tolerated in a surgical shoe.    2.  Acute kidney injury Renal function continues to improve with IV fluid hydration.  Follow-up on renal function in a.m.   3.Rypokalemia ; Resolved   4. Acute metabolic encephalopathy due to above. Significantly improved Aspiration precaution and fall precaution.  5.  Dysphagia Report of difficulty swallowing during breakfast this morning. Seen by speech therapist.  Patient reported to have prior history of esophageal dilatations in the past. Gastroenterology consult placed for evaluation for possible esophageal dilatation  6.Diabetes mellitus type II.  Start sliding scale and continue Lantus. Monitor blood sugars and adjust regimen  7. Hypertension.  Blood pressure controlled on current regimen. Continue home hypertension medication.  DVT prophylaxis; Lovenox  All the records are reviewed and case discussed with Care Management/Social Worker. Management plans discussed with the patient, family and they are in agreement.  CODE STATUS: Full Code  TOTAL TIME TAKING CARE OF THIS PATIENT: 28 minutes.   More than 50% of the time was spent in counseling/coordination of care: YES  POSSIBLE D/C IN 1 DAY, DEPENDING ON CLINICAL CONDITION.   Colleen Donahoe M.D on 03/12/2018 at 1:28 PM  Between 7am to 6pm - Pager - [phone]  After 6pm go to [email or web address] - Scientist, research (life sciences) [facility]  Office  [phone]  CC: Primary care physician; Verner Mould, MD  Note: This dictation was prepared with Dragon dictation along with smaller phrase technology. Any transcriptional errors that result from this process are unintentional.

## 2018-03-12 NOTE — Consult Note (Signed)
Jonathon Bellows , MD [address], [place], [place], [place], [address] [address], [place], [place], [place], [address] Phone: [phone]  Fax: [phone]  Consultation  Referring Provider:    Dr Margaretmary Eddy Primary Care Physician:  Clarisse Gouge, MD Primary Gastroenterologist:  Dr. Tiffany Kocher          Reason for Consultation:     Dysphagia  Date of Admission:  03/11/2018 Date of Consultation:  03/12/2018         HPI:   Stefanie Pham is a 85 y.o. female has been admitted to the hospital with osteomyelitis underwent amputation of the left great toe.  Seen by speech pathology today for a swallow evaluation.  The patient mentioned a sensation of food sticking in her throat. She has had issues with swallowing on and off over the years. She has been stretched twice by Dr Tiffany Kocher she recalls. She says at home had minimal issues with swallowing but worse last few days and since in hospital.   Worse for solids and points out to her neck where food gets stuck. Also complains of a dry mouth.  Past Medical History:  Diagnosis Date  . Diabetes mellitus without complication ([place])   . Diverticulitis   . Hernia, inguinal   . Hypertension   . Multiple sclerosis ([place])     Past Surgical History:  Procedure Laterality Date  . ABDOMINAL HYSTERECTOMY    . AMPUTATION TOE Left 03/11/2018   Procedure: AMPUTATION TOE LEFT GREAT TOE;  Surgeon: Sharlotte Alamo, DPM;  Location: [facility] ORS;  Service: Podiatry;  Laterality: Left;  . APPENDECTOMY    . CERVICAL SPINE SURGERY N/A   . CHOLECYSTECTOMY    . HERNIA REPAIR    . ORIF ANKLE FRACTURE Left   . REPLACEMENT TOTAL KNEE Left 2    Prior to Admission medications   Medication Sig Start Date End Date Taking? Authorizing Provider  amoxicillin-clavulanate (AUGMENTIN) 875-125 MG tablet Take 1 tablet by mouth every 12 (twelve) hours. 03/09/18 03/23/18 Yes [provider]  Ascorbic Acid 125 MG CHEW Chew 125 mg by mouth daily.   Yes [provider]  aspirin EC 81 MG tablet Take 81 mg by mouth at bedtime.   Yes [provider]  benazepril (LOTENSIN) 40 MG tablet Take 40 mg by mouth daily.   Yes [provider]  Bioflavonoid Products (VITAMIN C) CHEW Chew 1 each by mouth at bedtime.   Yes [provider]  Calcium Carbonate-Vitamin D (CALCIUM 500/D PO) Take 1 tablet by mouth daily.   Yes [provider]  Cholecalciferol (HM VITAMIN D3) 4000 UNITS CAPS Take 1 capsule by mouth at bedtime.   Yes [provider]  diphenhydrAMINE (BENADRYL) 25 mg capsule Take 50 mg by mouth at bedtime as needed for sleep.   Yes [provider]  ferrous gluconate (FERGON) 324 MG tablet Take 1 tablet by mouth daily. 01/21/18  Yes [provider]  gabapentin (NEURONTIN) 400 MG capsule Take 400 mg by mouth 4 (four) times daily.  12/29/17  Yes [provider]  glimepiride (AMARYL) 4 MG tablet Take 4 mg by mouth daily. 03/09/18 03/09/19 Yes [provider]  Insulin Glargine (LANTUS SOLOSTAR) 100 UNIT/ML Solostar Pen Inject 10 Units into the skin Nightly. 01/19/18 01/17/2019 Yes [provider]  interferon beta-1a (AVONEX) 30 MCG injection Inject 30 mcg into the muscle once a week.   Yes [provider]  Lactobacillus Acid-Pectin (EQL PROBIOTIC ACIDOPHILUS) CAPS Take 1  capsule by mouth daily.   Yes [provider]  magnesium gluconate (MAGONATE) 500 MG tablet Take 500 mg by mouth daily.   Yes [provider]  methadone (DOLOPHINE) 5 MG tablet Take 5 mg by mouth every 6 (six) hours.   Yes [provider]  mometasone (NASONEX) 50 MCG/ACT nasal spray Place 2 sprays into the nose at bedtime.   Yes [provider]  Multiple Vitamins-Minerals (MULTIVITAMIN PO) Take 2 each by mouth at bedtime.   Yes [provider]  omeprazole (PRILOSEC) 20 MG capsule Take 20 mg by mouth daily.   Yes [provider]  promethazine (PHENERGAN) 12.5 MG  tablet Take 12.5 mg by mouth every 6 (six) hours as needed for nausea or vomiting.   Yes [provider]  Pyridoxine HCl (VITAMIN B-6 PO) Take 1 tablet by mouth at bedtime.   Yes [provider]  triamcinolone cream (KENALOG) 0.1 % Apply 1 application topically 2 (two) times daily. 06/30/17 06/30/18 Yes [provider]  amLODipine (NORVASC) 2.5 MG tablet Take 1 tablet (2.5 mg total) by mouth daily. Patient not taking: Reported on 03/11/2018 07/04/14   Gladstone Lighter, MD  docusate sodium (COLACE) 100 MG capsule Take 100-200 mg by mouth 2 (two) times daily as needed for mild constipation.    [provider]  NARCAN 4 MG/0.1ML LIQD nasal spray kit Place 4 mg into the nose once. 10/30/17   [provider]  silver sulfADIAZINE (SILVADENE) 1 % cream Apply 1 application topically 2 (two) times daily. 01/15/18   [provider]    Family History  Problem Relation Age of Onset  . COPD Father      Social History   Tobacco Use  . Smoking status: Never Smoker  . Smokeless tobacco: Never Used  Substance Use Topics  . Alcohol use: No  . Drug use: No    Allergies as of 03/11/2018 - Review Complete 03/11/2018  Allergen Reaction Noted  . Hydralazine hcl Other (See Comments) 07/25/2014  . Morphine and related Shortness Of Breath 07/01/2014  . Codeine Nausea And Vomiting 07/01/2014    Review of Systems:    All systems reviewed and negative except where noted in HPI.   Physical Exam:  Vital signs in last 24 hours: Temp:  [98 F (36.7 C)-98.8 F (37.1 C)] 98.8 F (37.1 C) (02/27 1213) Pulse Rate:  [55-98] 88 (02/27 1215) Resp:  [10-20] 15 (02/27 0731) BP: (105-159)/(50-93) 130/63 (02/27 1213) SpO2:  [89 %-100 %] 95 % (02/27 1215) Last BM Date: 03/10/18 General:   Pleasant, cooperative in NAD Head:  Normocephalic and atraumatic. Eyes:   No icterus.   Conjunctiva pink. PERRLA. Ears:  Normal auditory acuity. Neck:  Supple; no masses or  thyroidomegaly Lungs: Respirations even and unlabored. Lungs clear to auscultation bilaterally.   No wheezes, crackles, or rhonchi.  Heart:  Regular rate and rhythm;  Without murmur, clicks, rubs or gallops Abdomen:  Soft, nondistended, nontender. Normal bowel sounds. No appreciable masses or hepatomegaly.  No rebound or guarding.  Neurologic:  Alert and oriented x3;  grossly normal neurologically. Skin:  Intact without significant lesions or rashes. Cervical Nodes:  No significant cervical adenopathy. Psych:  Alert and cooperative. Normal affect.  LAB RESULTS: Recent Labs    03/11/18 1217 03/12/18 0407  WBC 5.8 4.7  HGB 10.7* 9.1*  HCT 35.4* 29.3*  PLT 242 194   BMET Recent Labs    03/11/18 1217 03/12/18 0407  NA 138 139  K  5.3* 4.9  CL 103 108  CO2 26 25  GLUCOSE 190* 98  BUN 33* 25*  CREATININE 1.68* 1.28*  CALCIUM 9.1 8.2*   LFT Recent Labs    03/11/18 1217  PROT 7.8  ALBUMIN 3.4*  AST 17  ALT 13  ALKPHOS 50  BILITOT 0.4   PT/INR No results for input(s): LABPROT, INR in the last 72 hours.  STUDIES: Dg Chest 2 View  Result Date: 03/11/2018 CLINICAL DATA:  Evaluate for pneumonia.  Altered mental status. EXAM: CHEST - 2 VIEW COMPARISON:  None. FINDINGS: The heart size and mediastinal contours are within normal limits. Both lungs are clear. Degenerative changes are noted involving both AC joints. Thoracic spondylosis noted IMPRESSION: 1. No acute cardiopulmonary abnormalities. Electronically Signed   By: Kerby Moors M.D.   On: 03/11/2018 13:29      Impression / Plan:   Stefanie Pham is a 85 y.o. y/o female admitted with osteomyelitis of the left toe and underwent amputation.  I have been consulted for dysphagia.  Plan 1. She is not keen on an EGD tomorrow - she is however wiling to undergo a barium swallow.  2. Suggest eating food with sips of liquids at regular intervals, PPI and sit upright .   Thank you for involving me in the care of this  patient.      LOS: 1 day   Jonathon Bellows, MD  03/12/2018, 1:27 PM

## 2018-03-12 NOTE — Progress Notes (Signed)
1 Day Post-Op   Subjective/Chief Complaint: Patient seen.  No significant complaints.   Objective: Vital signs in last 24 hours: Temp:  [98 F (36.7 C)-98.4 F (36.9 C)] 98 F (36.7 C) (02/27 0731) Pulse Rate:  [55-98] 55 (02/27 0731) Resp:  [10-20] 15 (02/27 0731) BP: (105-165)/(50-93) 119/53 (02/27 0731) SpO2:  [95 %-100 %] 100 % (02/27 0731) Weight:  [99.8 kg] 99.8 kg (02/26 1204) Last BM Date: 03/10/18  Intake/Output from previous day: 02/26 0701 - 02/27 0700 In: 640 [I.V.:640] Out: -  Intake/Output this shift: No intake/output data recorded.  The bandage on the left foot is dry and intact.  Minimal bleeding upon removal.  Incision is well coapted with no significant erythema or edema at the amputation site.  Lab Results:  Recent Labs    03/11/18 1217 03/12/18 0407  WBC 5.8 4.7  HGB 10.7* 9.1*  HCT 35.4* 29.3*  PLT 242 194   BMET Recent Labs    03/11/18 1217 03/12/18 0407  NA 138 139  K 5.3* 4.9  CL 103 108  CO2 26 25  GLUCOSE 190* 98  BUN 33* 25*  CREATININE 1.68* 1.28*  CALCIUM 9.1 8.2*   PT/INR No results for input(s): LABPROT, INR in the last 72 hours. ABG No results for input(s): PHART, HCO3 in the last 72 hours.  Invalid input(s): PCO2, PO2  Studies/Results: Dg Chest 2 View  Result Date: 03/11/2018 CLINICAL DATA:  Evaluate for pneumonia.  Altered mental status. EXAM: CHEST - 2 VIEW COMPARISON:  None. FINDINGS: The heart size and mediastinal contours are within normal limits. Both lungs are clear. Degenerative changes are noted involving both AC joints. Thoracic spondylosis noted IMPRESSION: 1. No acute cardiopulmonary abnormalities. Electronically Signed   By: Signa Kell M.D.   On: 03/11/2018 13:29    Anti-infectives: Anti-infectives (From admission, onward)   Start     Dose/Rate Route Frequency Ordered Stop   03/13/18 1500  vancomycin (VANCOCIN) 1,750 mg in sodium chloride 0.9 % 500 mL IVPB     1,750 mg 250 mL/hr over 120 Minutes  Intravenous Every 48 hours 03/11/18 1353     03/11/18 2200  piperacillin-tazobactam (ZOSYN) IVPB 3.375 g     3.375 g 12.5 mL/hr over 240 Minutes Intravenous Every 8 hours 03/11/18 1347     03/11/18 2041  gentamicin (GARAMYCIN) 80 mg in sodium chloride 0.9 % 500 mL irrigation  Status:  Discontinued       As needed 03/11/18 2041 03/11/18 2059   03/11/18 1815  vancomycin (VANCOCIN) 2,000 mg in sodium chloride 0.9 % 500 mL IVPB     2,000 mg 250 mL/hr over 120 Minutes Intravenous  Once 03/11/18 1810 03/11/18 2052   03/11/18 1345  vancomycin (VANCOCIN) IVPB 1000 mg/200 mL premix  Status:  Discontinued     1,000 mg 200 mL/hr over 60 Minutes Intravenous  Once 03/11/18 1339 03/11/18 1810   03/11/18 1315  piperacillin-tazobactam (ZOSYN) IVPB 3.375 g     3.375 g 100 mL/hr over 30 Minutes Intravenous  Once 03/11/18 1302 03/11/18 1426   03/11/18 1315  vancomycin (VANCOCIN) IVPB 1000 mg/200 mL premix  Status:  Discontinued     1,000 mg 200 mL/hr over 60 Minutes Intravenous  Once 03/11/18 1302 03/11/18 1810      Assessment/Plan: s/p Procedure(s): AMPUTATION TOE LEFT GREAT TOE (Left) Assessment: Stable status post amputation left great toe.  Plan: Betadine and a sterile gauze bandage reapplied to the left foot.  Patient should be stable for discharge  today.  Recommend oral antibiotics and think Augmentin would be fine for a week.  Keep the bandage on the left foot dry and intact and do not remove.  Weightbearing as tolerated in a surgical shoe.  Follow-up outpatient in 1 week for reevaluation.  LOS: 1 day    [name] 03/12/2018

## 2018-03-13 ENCOUNTER — Inpatient Hospital Stay: Payer: Medicare Other

## 2018-03-13 LAB — BASIC METABOLIC PANEL
Anion gap: 7 (ref 5–15)
BUN: 7 mg/dL — ABNORMAL LOW (ref 8–23)
CO2: 21 mmol/L — ABNORMAL LOW (ref 22–32)
Calcium: 8.1 mg/dL — ABNORMAL LOW (ref 8.9–10.3)
Chloride: 116 mmol/L — ABNORMAL HIGH (ref 98–111)
Creatinine, Ser: 0.67 mg/dL (ref 0.44–1.00)
GFR calc Af Amer: >60 mL/min (ref >60)
GFR calc non Af Amer: >60 mL/min (ref >60)
Glucose, Bld: 130 mg/dL — ABNORMAL HIGH (ref 70–99)
Potassium: 3.9 mmol/L (ref 3.5–5.1)
Sodium: 144 mmol/L (ref 135–145)

## 2018-03-13 LAB — GLUCOSE, CAPILLARY
Glucose-Capillary: 119 mg/dL — ABNORMAL HIGH (ref 70–99)
Glucose-Capillary: 142 mg/dL — ABNORMAL HIGH (ref 70–99)

## 2018-03-13 LAB — SURGICAL PATHOLOGY

## 2018-03-13 LAB — MAGNESIUM: MAGNESIUM: 1.6 mg/dL — AB (ref 1.7–2.4)

## 2018-03-13 MED ORDER — MAGNESIUM SULFATE IN D5W 1-5 GM/100ML-% IV SOLN
1.0000 g | Freq: Once | INTRAVENOUS | Status: DC
  Filled 2018-03-13: qty 100

## 2018-03-13 MED ORDER — VANCOMYCIN HCL 10 G IV SOLR
1250.0000 mg | INTRAVENOUS | Status: DC
  Filled 2018-03-13: qty 1250

## 2018-03-13 MED ORDER — AMOXICILLIN-POT CLAVULANATE 875-125 MG PO TABS: 1.0000 | ORAL_TABLET | Freq: Two times a day (BID) | ORAL | 0 refills | Status: AC

## 2018-03-13 NOTE — Consult Note (Signed)
Pharmacy Antibiotic Note  Stefanie Pham is a 85 y.o. female admitted on 03/11/2018 with wound infection.  Pharmacy has been consulted for Vancomycin/Zosyn dosing.  Plan: Zosyn 3.375 g IV q 8hr (extended 4 hour infusion)  2/26: Will give a total loading dose of Vancomycin IV 2g (2 x 1000mg ) Followed by Vancomycin 1750 mg IV Q 48 hrs. Goal AUC 400-550. Expected AUC: 497 SCr used: 1.68  2/28 SCr now 0.67 Will change dose to vancomycin 1250 mg IV q24h. Goal AUC 400-550. Expected AUC: 538.4 SCr used: 0.8   Height: 5\' 6"  (167.6 cm) Weight: 220 lb (99.8 kg) IBW/kg (Calculated) : 59.3  Temp (24hrs), Avg:98.5 F (36.9 C), Min:98.1 F (36.7 C), Max:98.8 F (37.1 C)  Recent Labs  Lab 03/11/18 1217 03/12/18 0407 03/13/18 0407  WBC 5.8 4.7  --   CREATININE 1.68* 1.28* 0.67    Estimated Creatinine Clearance: 64.6 mL/min (by C-G formula based on SCr of 0.67 mg/dL).    Allergies  Allergen Reactions  . Hydralazine Hcl Other (See Comments)    Severe hyponatremia  . Morphine And Related Shortness Of Breath  . Codeine Nausea And Vomiting    Antimicrobials this admission: Zosyn 2/26 >>  Vancomycin 2/26 >>    Microbiology results: WCx pending  Thank you for allowing pharmacy to be a part of this patient's care.  Marty Heck, PharmD, BCPS Clinical Pharmacist 03/13/2018 10:01 AM

## 2018-03-13 NOTE — Progress Notes (Signed)
Wyline MoodKiran Kathie Pham , MD [address], [address], [place], [place], [address] [address], [place], [place], [address] Phone: [phone]  Fax: [phone]   Stefanie Pham is being followed for dysphgaia  Day 1 of follow up   Subjective: About [place]    Objective: Vital signs in last 24 hours: Vitals:   03/12/18 1215 03/12/18 1648 03/12/18 2347 03/13/18 0730  BP:  (!) 145/77 (!) 123/51 125/60  Pulse: 88 66 (!) 56 (!) 59  Resp:   18 18  Temp:  98.4 F (36.9 C) 98.1 F (36.7 C) 98.5 F (36.9 C)  TempSrc:  Oral Oral Oral  SpO2: 95%  94% 94%  Weight:      Height:       Weight change:   Intake/Output Summary (Last 24 hours) at 03/13/2018 [id number] Last data filed at 03/12/2018 2017 Gross per 24 hour  Intake 360 ml  Output 550 ml  Net -190 ml     Exam: Heart:: Regular rate and rhythm, S1S2 present or without murmur or extra heart sounds Lungs: normal, clear to auscultation and clear to auscultation and percussion Abdomen: soft, nontender, normal bowel sounds   Lab Results: @LABTEST2 @ Micro Results: Recent Results (from the past 240 hour(s))  Aerobic/Anaerobic Culture (surgical/deep wound)     Status: None (Preliminary result)   Collection Time: 03/11/18  8:39 PM  Result Value Ref Range Status   Specimen Description   Final    WOUND LEFT GREAT TOE Performed at [facility], [address] [place], [place] [address]    Special Requests   Final    NONE Performed at [facility], [address] [place], [place] [address]    Gram Stain   Final    NO WBC SEEN NO ORGANISMS SEEN Performed at [facility], [address] [place], [place] [address]    Culture CULTURE REINCUBATED FOR BETTER GROWTH  Final   Report Status PENDING  Incomplete   Studies/Results: Dg Chest 2 View  Result Date: 03/11/2018 CLINICAL DATA:  Evaluate for pneumonia.  Altered mental status. EXAM: CHEST - 2 VIEW COMPARISON:  None. FINDINGS: The heart size  and mediastinal contours are within normal limits. Both lungs are clear. Degenerative changes are noted involving both AC joints. Thoracic spondylosis noted IMPRESSION: 1. No acute cardiopulmonary abnormalities. Electronically Signed   By: Signa Kellaylor  Stroud M.D.   On: 03/11/2018 13:29   Medications: I have reviewed the patient's current medications. Scheduled Meds: . benazepril  40 mg Oral Daily  . calcium-vitamin D  1 tablet Oral Daily  . cholecalciferol  4,000 Units Oral QHS  . enoxaparin (LOVENOX) injection  40 mg Subcutaneous Q24H  . ferrous gluconate  324 mg Oral Daily  . fluticasone  1 spray Each Nare Daily  . gabapentin  400 mg Oral QID  . insulin aspart  0-5 Units Subcutaneous QHS  . insulin aspart  0-9 Units Subcutaneous TID WC  . insulin glargine  10 Units Subcutaneous QHS  . lactobacillus acidophilus  1 tablet Oral Daily  . magnesium gluconate  500 mg Oral Daily  . methadone  5 mg Oral Q6H  . multivitamin   Oral QHS  . pantoprazole  40 mg Oral Daily  . vitamin C  250 mg Oral Daily   Continuous Infusions: . magnesium sulfate 1 - 4 g bolus IVPB    . piperacillin-tazobactam (ZOSYN)  IV 3.375 g (03/12/18 2215)  . vancomycin  PRN Meds:.acetaminophen **OR** acetaminophen, albuterol, bisacodyl, diphenhydrAMINE, ketorolac, ondansetron **OR** ondansetron (ZOFRAN) IV, senna-docusate   Assessment: Active Problems:   Osteomyelitis (HCC)  Stefanie Pham is a 85 y.o. y/o female admitted with osteomyelitis of the left toe and underwent amputation.  I have been consulted for dysphagia.  Barium swallow shows "Cervical and thoracic esophagus are widely patent. No obstructingabnormality identified. No reflux was demonstrated. Exam was limited due to patient's clinical condition. Patient had difficulty lying onthe table without pain."  Plan 1. Since esophagus is widely patent - need to rule out transfer dysphagia with a modified barium swallow which can be done as an outpatinet 2.  It may very well be her dry mouth that is making it hard to swallow.    I will sign off.  Please call me if any further GI concerns or questions.  We would like to thank you for the opportunity to participate in the care of Stefanie Pham [facility].     LOS: 2 days   Wyline Mood, MD 03/13/2018, 9:42 AM

## 2018-03-13 NOTE — Discharge Summary (Signed)
[place] [facility] [place] NAME: Stefanie Pham    MR#:  [id number]  DATE OF BIRTH:  Dec 05, 1933  DATE OF ADMISSION:  03/11/2018   ADMITTING PHYSICIAN: Demetrios Loll, MD  DATE OF DISCHARGE: 03/13/2018  PRIMARY CARE PHYSICIAN: Clarisse Gouge, MD   ADMISSION DIAGNOSIS:  Hyperglycemia [R73.9] Osteomyelitis of ankle or foot, acute, left ([place]) [M86.172] DISCHARGE DIAGNOSIS:  Active Problems:   Osteomyelitis ([place])  SECONDARY DIAGNOSIS:   Past Medical History:  Diagnosis Date  . Diabetes mellitus without complication ([place])   . Diverticulitis   . Hernia, inguinal   . Hypertension   . Multiple sclerosis [facility])    HOSPITAL COURSE:  Chief complaint; altered mental status  History of presenting complaint; Stefanie Pham  is a 85 y.o. female with a known history of hypertension, diabetes, vertigo diabetes, multiple sclerosis and inguinal hernia.  The patient is sent to ED due to worsening confusion and left great toe pain with discharge for the past 3 weeks.  She denies any fever or chills.  X-ray 1 day prior to admission showed left great toe osteomyelitis.  ED physician discussed with podiatrist, who planned to get amputation.  The patient was started on Zosyn and vancomycin in the ED. please refer to the H&P dictated for further details  Hospital course;  1. Left great toe osteomyelitis. Patient was initially started empirically on broad-spectrum IV antibiotics with vancomycin and Zosyn. Patient seen by podiatrist.  Status post partial amputation of left great toe.Betadine and a sterile gauze bandage reapplied to the left foot by podiatrist. Plan is for discharge on p.o. Augmentin for 1 week when medically ready for discharge Follow-up with podiatrist in 1 week.  Weightbearing as tolerated in a surgical shoe.   2.  Acute kidney injury Resolved with IV fluid hydration.  Creatinine down to 0.67 prior to discharge.    3. Acute metabolic encephalopathy  due to above. Significantly improved.  Appears back to baseline. Aspiration precaution and fall precaution.  4.  Dysphagia Report of difficulty swallowing during breakfast yesterday.  Seen by speech therapist.  Patient reported to have prior history of esophageal dilatations in the past.  Patient was seen by gastroenterologist Dr. Bailey Mech.  Patient was not keen on having EGD with dilatation done during this admission.  Barium swallow was ordered by gastroenterologist which did not reveal any focal abnormality.  No obstructing abnormality.  No reflux noted.  This morning patient had breakfast.  Did not have any difficulty swallowing.  Advised patient to follow-up with gastroenterologist as outpatient for ongoing monitoring and evaluation as indicated clinically.  5.Diabetes mellitus type II.  Resume home regimen on discharge  7. Hypertension.  Blood pressure controlled on current regimen. Continue home hypertension medication.  Disposition; patient clinically and hemodynamically stable.  Updated patient's daughter at bedside on treatment and discharge plans.  All questions were answered.  DISCHARGE CONDITIONS:  Stable CONSULTS OBTAINED:  Treatment Team:  Sharlotte Alamo, DPM Jonathon Bellows, MD Otila Back, MD DRUG ALLERGIES:   Allergies  Allergen Reactions  . Hydralazine Hcl Other (See Comments)    Severe hyponatremia  . Morphine And Related Shortness Of Breath  . Codeine Nausea And Vomiting   DISCHARGE MEDICATIONS:   Allergies as of 03/13/2018      Reactions   Hydralazine Hcl Other (See Comments)   Severe hyponatremia   Morphine And Related Shortness Of Breath   Codeine Nausea And Vomiting      Medication List  TAKE these medications   amLODipine 2.5 MG tablet Commonly known as:  NORVASC Take 1 tablet (2.5 mg total) by mouth daily.   amoxicillin-clavulanate 875-125 MG tablet Commonly known as:  AUGMENTIN Take 1 tablet by mouth every 12 (twelve) hours for 7 days.     Ascorbic Acid 125 MG Chew Chew 125 mg by mouth daily.   aspirin EC 81 MG tablet Take 81 mg by mouth at bedtime.   benazepril 40 MG tablet Commonly known as:  LOTENSIN Take 40 mg by mouth daily.   CALCIUM 500/D PO Take 1 tablet by mouth daily.   diphenhydrAMINE 25 mg capsule Commonly known as:  BENADRYL Take 50 mg by mouth at bedtime as needed for sleep.   docusate sodium 100 MG capsule Commonly known as:  COLACE Take 100-200 mg by mouth 2 (two) times daily as needed for mild constipation.   EQL PROBIOTIC ACIDOPHILUS Caps Take 1 capsule by mouth daily.   ferrous gluconate 324 MG tablet Commonly known as:  FERGON Take 1 tablet by mouth daily.   gabapentin 400 MG capsule Commonly known as:  NEURONTIN Take 400 mg by mouth 4 (four) times daily.   glimepiride 4 MG tablet Commonly known as:  AMARYL Take 4 mg by mouth daily.   HM VITAMIN D3 100 MCG (4000 UT) Caps Generic drug:  Cholecalciferol Take 1 capsule by mouth at bedtime.   interferon beta-1a 30 MCG injection Commonly known as:  AVONEX Inject 30 mcg into the muscle once a week.   LANTUS SOLOSTAR 100 UNIT/ML Solostar Pen Generic drug:  Insulin Glargine Inject 10 Units into the skin Nightly.   magnesium gluconate 500 MG tablet Commonly known as:  MAGONATE Take 500 mg by mouth daily.   methadone 5 MG tablet Commonly known as:  DOLOPHINE Take 5 mg by mouth every 6 (six) hours.   mometasone 50 MCG/ACT nasal spray Commonly known as:  NASONEX Place 2 sprays into the nose at bedtime.   MULTIVITAMIN PO Take 2 each by mouth at bedtime.   NARCAN 4 MG/0.1ML Liqd nasal spray kit Generic drug:  naloxone Place 4 mg into the nose once.   omeprazole 20 MG capsule Commonly known as:  PRILOSEC Take 20 mg by mouth daily.   promethazine 12.5 MG tablet Commonly known as:  PHENERGAN Take 12.5 mg by mouth every 6 (six) hours as needed for nausea or vomiting.   silver sulfADIAZINE 1 % cream Commonly known as:   SILVADENE Apply 1 application topically 2 (two) times daily.   triamcinolone cream 0.1 % Commonly known as:  KENALOG Apply 1 application topically 2 (two) times daily.   VITAMIN B-6 PO Take 1 tablet by mouth at bedtime.   Vitamin C Chew Chew 1 each by mouth at bedtime.        DISCHARGE INSTRUCTIONS:   DIET:  Diabetic diet DISCHARGE CONDITION:  Stable ACTIVITY:  Activity as tolerated OXYGEN:  Home Oxygen: No.  Oxygen Delivery: room air DISCHARGE LOCATION:  home   If you experience worsening of your admission symptoms, develop shortness of breath, life threatening emergency, suicidal or homicidal thoughts you must seek medical attention immediately by calling 911 or calling your MD immediately  if symptoms less severe.  You Must read complete instructions/literature along with all the possible adverse reactions/side effects for all the Medicines you take and that have been prescribed to you. Take any new Medicines after you have completely understood and accpet all the possible adverse reactions/side effects.  Please note  You were cared for by a hospitalist during your hospital stay. If you have any questions about your discharge medications or the care you received while you were in the hospital after you are discharged, you can call the unit and asked to speak with the hospitalist on call if the hospitalist that took care of you is not available. Once you are discharged, your primary care physician will handle any further medical issues. Please note that NO REFILLS for any discharge medications will be authorized once you are discharged, as it is imperative that you return to your primary care physician (or establish a relationship with a primary care physician if you do not have one) for your aftercare needs so that they can reassess your need for medications and monitor your lab values.    On the day of Discharge:  VITAL SIGNS:  Blood pressure 125/60, pulse (!) 59,  temperature 98.5 F (36.9 C), temperature source Oral, resp. rate 18, height 5' 6" (1.676 m), weight 99.8 kg, SpO2 94 %. PHYSICAL EXAMINATION:  GENERAL:  85 y.o.-year-old patient lying in the bed with no acute distress.  EYES: Pupils equal, round, reactive to light and accommodation. No scleral icterus. Extraocular muscles intact.  HEENT: Head atraumatic, normocephalic. Oropharynx and nasopharynx clear.  NECK:  Supple, no jugular venous distention. No thyroid enlargement, no tenderness.  LUNGS: Normal breath sounds bilaterally, no wheezing, rales,rhonchi or crepitation. No use of accessory muscles of respiration.  CARDIOVASCULAR: S1, S2 normal. No murmurs, rubs, or gallops.  ABDOMEN: Soft, non-tender, non-distended. Bowel sounds present. No organomegaly or mass.  EXTREMITIES: No pedal edema, Dressing in place to left foot status post recent partial amputation of great toe  NEUROLOGIC: Cranial nerves II through XII are intact. Muscle strength 5/5 in all extremities. Sensation intact. Gait not checked.  PSYCHIATRIC: The patient is alert and oriented x 3.  SKIN: No obvious rash, lesion, or ulcer.  DATA REVIEW:   CBC Recent Labs  Lab 03/12/18 0407  WBC 4.7  HGB 9.1*  HCT 29.3*  PLT 194    Chemistries  Recent Labs  Lab 03/11/18 1217  03/13/18 0407  NA 138   < > 144  K 5.3*   < > 3.9  CL 103   < > 116*  CO2 26   < > 21*  GLUCOSE 190*   < > 130*  BUN 33*   < > 7*  CREATININE 1.68*   < > 0.67  CALCIUM 9.1   < > 8.1*  MG  --   --  1.6*  AST 17  --   --   ALT 13  --   --   ALKPHOS 50  --   --   BILITOT 0.4  --   --    < > = values in this interval not displayed.     Microbiology Results  Results for orders placed or performed during the hospital encounter of 03/11/18  Aerobic/Anaerobic Culture (surgical/deep wound)     Status: None (Preliminary result)   Collection Time: 03/11/18  8:39 PM  Result Value Ref Range Status   Specimen Description   Final    WOUND LEFT GREAT  TOE Performed at [facility], [address], [place], [place] [address]    Special Requests   Final    NONE Performed at [facility], [address] [place], [place] [address]    Gram Stain   Final    NO WBC SEEN NO  ORGANISMS SEEN Performed at [facility], [place] [address], [place], [place] [address]    Culture CULTURE REINCUBATED FOR BETTER GROWTH  Final   Report Status PENDING  Incomplete    RADIOLOGY:  Dg Esophagus W Single Cm (sol Or Thin Ba)  Result Date: 03/13/2018 CLINICAL DATA:  Dysphagia.  Prior history of dilatation. EXAM: Barium swallow. TECHNIQUE: Single-contrast barium swallow was performed. Exam was limited due to patient's condition. FLUOROSCOPY TIME:  Fluoroscopy Time:  0 minutes 24 seconds Radiation Exposure Index (if provided by the fluoroscopic device): 6.0 mGy Number of Acquired Spot Images: 8 COMPARISON:  03/11/2018. FINDINGS: Cervical and thoracic esophagus are widely patent. No obstructing abnormality identified. No reflux was demonstrated. Exam was limited due to patient's clinical condition. Patient had difficulty lying on the table without pain. IMPRESSION: Limited exam. No focal abnormality identified. No obstructing abnormality. No reflux noted. Electronically Signed   By: Marcello Moores  Register   On: 03/13/2018 09:56     Management plans discussed with the patient, family and they are in agreement.  CODE STATUS: Full Code   TOTAL TIME TAKING CARE OF THIS PATIENT: 36 minutes.      M.D on 03/13/2018 at 1:38 PM  Between 7am to 6pm - Pager - [phone]  After 6pm go to [email or web address] - [employer] [facility]  Office  [phone]  CC: Primary care physician; Clarisse Gouge, MD   Note: This dictation was prepared with Dragon dictation along with smaller phrase technology. Any transcriptional errors that result from this process are unintentional.

## 2018-03-13 NOTE — Care Management Important Message (Signed)
Important Message  Patient Details  Name: Camaria Nicklow MRN: [id number] Date of Birth: [birth year]   Medicare Important Message Given:  Yes    Olegario Messier A Bonnee Zertuche 03/13/2018, 11:55 AM

## 2018-03-16 NOTE — Addendum Note (Signed)
Addendum  created 03/16/18 1347 by Stormy Fabian, CRNA   Charge Capture section accepted

## 2018-03-17 LAB — AEROBIC/ANAEROBIC CULTURE (SURGICAL/DEEP WOUND)

## 2018-03-17 LAB — AEROBIC/ANAEROBIC CULTURE W GRAM STAIN (SURGICAL/DEEP WOUND)
Culture: NORMAL
Gram Stain: NONE SEEN

## 2018-04-11 ENCOUNTER — Inpatient Hospital Stay
Admission: EM | Admit: 2018-04-11 | Discharge: 2018-04-16 | DRG: 871 | Disposition: A | Discharge status: Home Health | Payer: Medicare Other | Attending: Family Medicine | Admitting: Family Medicine

## 2018-04-11 ENCOUNTER — Encounter: Payer: Self-pay | Admitting: Emergency Medicine

## 2018-04-11 ENCOUNTER — Emergency Department: Payer: Medicare Other

## 2018-04-11 ENCOUNTER — Other Ambulatory Visit: Payer: Self-pay

## 2018-04-11 DIAGNOSIS — R7881 Bacteremia: Secondary | ICD-10-CM | POA: Diagnosis not present

## 2018-04-11 DIAGNOSIS — I509 Heart failure, unspecified: Secondary | ICD-10-CM | POA: Diagnosis present

## 2018-04-11 DIAGNOSIS — D539 Nutritional anemia, unspecified: Secondary | ICD-10-CM | POA: Diagnosis present

## 2018-04-11 DIAGNOSIS — J181 Lobar pneumonia, unspecified organism: Secondary | ICD-10-CM

## 2018-04-11 DIAGNOSIS — A4181 Sepsis due to Enterococcus: Principal | ICD-10-CM | POA: Diagnosis present

## 2018-04-11 DIAGNOSIS — Z993 Dependence on wheelchair: Secondary | ICD-10-CM | POA: Diagnosis not present

## 2018-04-11 DIAGNOSIS — Z7982 Long term (current) use of aspirin: Secondary | ICD-10-CM | POA: Diagnosis not present

## 2018-04-11 DIAGNOSIS — L03119 Cellulitis of unspecified part of limb: Secondary | ICD-10-CM | POA: Diagnosis not present

## 2018-04-11 DIAGNOSIS — N390 Urinary tract infection, site not specified: Secondary | ICD-10-CM | POA: Diagnosis present

## 2018-04-11 DIAGNOSIS — Z885 Allergy status to narcotic agent status: Secondary | ICD-10-CM | POA: Diagnosis not present

## 2018-04-11 DIAGNOSIS — L03314 Cellulitis of groin: Secondary | ICD-10-CM | POA: Diagnosis present

## 2018-04-11 DIAGNOSIS — G92 Toxic encephalopathy: Secondary | ICD-10-CM | POA: Diagnosis present

## 2018-04-11 DIAGNOSIS — I10 Essential (primary) hypertension: Secondary | ICD-10-CM | POA: Diagnosis not present

## 2018-04-11 DIAGNOSIS — R6521 Severe sepsis with septic shock: Secondary | ICD-10-CM | POA: Diagnosis present

## 2018-04-11 DIAGNOSIS — L89152 Pressure ulcer of sacral region, stage 2: Secondary | ICD-10-CM | POA: Diagnosis present

## 2018-04-11 DIAGNOSIS — E119 Type 2 diabetes mellitus without complications: Secondary | ICD-10-CM | POA: Diagnosis not present

## 2018-04-11 DIAGNOSIS — Z20828 Contact with and (suspected) exposure to other viral communicable diseases: Secondary | ICD-10-CM

## 2018-04-11 DIAGNOSIS — J189 Pneumonia, unspecified organism: Secondary | ICD-10-CM | POA: Diagnosis present

## 2018-04-11 DIAGNOSIS — Z888 Allergy status to other drugs, medicaments and biological substances status: Secondary | ICD-10-CM | POA: Diagnosis not present

## 2018-04-11 DIAGNOSIS — Z96652 Presence of left artificial knee joint: Secondary | ICD-10-CM | POA: Diagnosis present

## 2018-04-11 DIAGNOSIS — R532 Functional quadriplegia: Secondary | ICD-10-CM | POA: Diagnosis present

## 2018-04-11 DIAGNOSIS — I110 Hypertensive heart disease with heart failure: Secondary | ICD-10-CM | POA: Diagnosis present

## 2018-04-11 DIAGNOSIS — Z79899 Other long term (current) drug therapy: Secondary | ICD-10-CM

## 2018-04-11 DIAGNOSIS — R0902 Hypoxemia: Secondary | ICD-10-CM | POA: Diagnosis present

## 2018-04-11 DIAGNOSIS — B373 Candidiasis of vulva and vagina: Secondary | ICD-10-CM | POA: Diagnosis present

## 2018-04-11 DIAGNOSIS — G35 Multiple sclerosis: Secondary | ICD-10-CM | POA: Diagnosis present

## 2018-04-11 DIAGNOSIS — L03115 Cellulitis of right lower limb: Secondary | ICD-10-CM | POA: Diagnosis present

## 2018-04-11 DIAGNOSIS — G4700 Insomnia, unspecified: Secondary | ICD-10-CM | POA: Diagnosis not present

## 2018-04-11 DIAGNOSIS — D509 Iron deficiency anemia, unspecified: Secondary | ICD-10-CM | POA: Diagnosis not present

## 2018-04-11 DIAGNOSIS — A419 Sepsis, unspecified organism: Secondary | ICD-10-CM

## 2018-04-11 DIAGNOSIS — Z89412 Acquired absence of left great toe: Secondary | ICD-10-CM | POA: Diagnosis not present

## 2018-04-11 DIAGNOSIS — Z794 Long term (current) use of insulin: Secondary | ICD-10-CM

## 2018-04-11 DIAGNOSIS — I361 Nonrheumatic tricuspid (valve) insufficiency: Secondary | ICD-10-CM | POA: Diagnosis not present

## 2018-04-11 DIAGNOSIS — N179 Acute kidney failure, unspecified: Secondary | ICD-10-CM | POA: Diagnosis present

## 2018-04-11 DIAGNOSIS — Z8739 Personal history of other diseases of the musculoskeletal system and connective tissue: Secondary | ICD-10-CM | POA: Diagnosis not present

## 2018-04-11 DIAGNOSIS — E11649 Type 2 diabetes mellitus with hypoglycemia without coma: Secondary | ICD-10-CM | POA: Diagnosis not present

## 2018-04-11 DIAGNOSIS — B952 Enterococcus as the cause of diseases classified elsewhere: Secondary | ICD-10-CM | POA: Diagnosis not present

## 2018-04-11 DIAGNOSIS — E860 Dehydration: Secondary | ICD-10-CM | POA: Diagnosis present

## 2018-04-11 LAB — CBC WITH DIFFERENTIAL/PLATELET
ABS IMMATURE GRANULOCYTES: 0.05 10*3/uL (ref 0.00–0.07)
Basophils Absolute: 0 10*3/uL (ref 0.0–0.1)
Basophils Relative: 0 %
Eosinophils Absolute: 0 10*3/uL (ref 0.0–0.5)
Eosinophils Relative: 0 %
HEMATOCRIT: 32 % — AB (ref 36.0–46.0)
Hemoglobin: 10.3 g/dL — ABNORMAL LOW (ref 12.0–15.0)
Immature Granulocytes: 0 %
Lymphocytes Relative: 8 %
Lymphs Abs: 1 10*3/uL (ref 0.7–4.0)
MCH: 32 pg (ref 26.0–34.0)
MCHC: 32.2 g/dL (ref 30.0–36.0)
MCV: 99.4 fL (ref 80.0–100.0)
Monocytes Absolute: 0.7 10*3/uL (ref 0.1–1.0)
Monocytes Relative: 6 %
NEUTROS ABS: 10.8 10*3/uL — AB (ref 1.7–7.7)
Neutrophils Relative %: 86 %
Platelets: 208 10*3/uL (ref 150–400)
RBC: 3.22 MIL/uL — ABNORMAL LOW (ref 3.87–5.11)
RDW: 15.6 % — ABNORMAL HIGH (ref 11.5–15.5)
WBC: 12.6 10*3/uL — ABNORMAL HIGH (ref 4.0–10.5)
nRBC: 0 % (ref 0.0–0.2)

## 2018-04-11 LAB — COMPREHENSIVE METABOLIC PANEL
ALK PHOS: 52 U/L (ref 38–126)
ALT: 18 U/L (ref 0–44)
ANION GAP: 7 (ref 5–15)
AST: 32 U/L (ref 15–41)
Albumin: 3 g/dL — ABNORMAL LOW (ref 3.5–5.0)
BUN: 21 mg/dL (ref 8–23)
CO2: 25 mmol/L (ref 22–32)
Calcium: 8.6 mg/dL — ABNORMAL LOW (ref 8.9–10.3)
Chloride: 103 mmol/L (ref 98–111)
Creatinine, Ser: 1.58 mg/dL — ABNORMAL HIGH (ref 0.44–1.00)
GFR calc Af Amer: 35 mL/min — ABNORMAL LOW (ref >60)
GFR calc non Af Amer: 30 mL/min — ABNORMAL LOW (ref >60)
Glucose, Bld: 157 mg/dL — ABNORMAL HIGH (ref 70–99)
Potassium: 5.1 mmol/L (ref 3.5–5.1)
Sodium: 135 mmol/L (ref 135–145)
Total Bilirubin: 0.4 mg/dL (ref 0.3–1.2)
Total Protein: 7.5 g/dL (ref 6.5–8.1)

## 2018-04-11 LAB — URINALYSIS, COMPLETE (UACMP) WITH MICROSCOPIC
Bacteria, UA: NONE SEEN
Bilirubin Urine: NEGATIVE
Glucose, UA: NEGATIVE mg/dL
Ketones, ur: NEGATIVE mg/dL
Nitrite: NEGATIVE
Protein, ur: 30 mg/dL — AB
Specific Gravity, Urine: 1.02 (ref 1.005–1.030)
pH: 7 (ref 5.0–8.0)

## 2018-04-11 LAB — BLOOD CULTURE ID PANEL (REFLEXED)
Acinetobacter baumannii: NOT DETECTED
Candida albicans: NOT DETECTED
Candida glabrata: NOT DETECTED
Candida krusei: NOT DETECTED
Candida parapsilosis: NOT DETECTED
Candida tropicalis: NOT DETECTED
ENTEROBACTER CLOACAE COMPLEX: NOT DETECTED
Enterobacteriaceae species: NOT DETECTED
Enterococcus species: DETECTED — AB
Escherichia coli: NOT DETECTED
Haemophilus influenzae: NOT DETECTED
Klebsiella oxytoca: NOT DETECTED
Klebsiella pneumoniae: NOT DETECTED
Listeria monocytogenes: NOT DETECTED
Neisseria meningitidis: NOT DETECTED
PROTEUS SPECIES: NOT DETECTED
Pseudomonas aeruginosa: NOT DETECTED
STREPTOCOCCUS AGALACTIAE: NOT DETECTED
STREPTOCOCCUS SPECIES: NOT DETECTED
Serratia marcescens: NOT DETECTED
Staphylococcus aureus (BCID): NOT DETECTED
Staphylococcus species: NOT DETECTED
Streptococcus pneumoniae: NOT DETECTED
Streptococcus pyogenes: NOT DETECTED
Vancomycin resistance: NOT DETECTED

## 2018-04-11 LAB — LACTIC ACID, PLASMA
Lactic Acid, Venous: 1 mmol/L (ref 0.5–1.9)
Lactic Acid, Venous: 1.3 mmol/L (ref 0.5–1.9)
Lactic Acid, Venous: 1 mmol/L (ref 0.5–1.9)

## 2018-04-11 LAB — PROCALCITONIN: PROCALCITONIN: 0.79 ng/mL

## 2018-04-11 LAB — GLUCOSE, CAPILLARY: Glucose-Capillary: 84 mg/dL (ref 70–99)

## 2018-04-11 LAB — PROTIME-INR
INR: 1.1 (ref 0.8–1.2)
Prothrombin Time: 14.3 seconds (ref 11.4–15.2)

## 2018-04-11 LAB — STREP PNEUMONIAE URINARY ANTIGEN: Strep Pneumo Urinary Antigen: NEGATIVE

## 2018-04-11 LAB — AMMONIA: Ammonia: 14 umol/L (ref 9–35)

## 2018-04-11 LAB — MAGNESIUM: Magnesium: 1.7 mg/dL (ref 1.7–2.4)

## 2018-04-11 LAB — MRSA PCR SCREENING: MRSA by PCR: NEGATIVE

## 2018-04-11 MED ORDER — ACETAMINOPHEN 10 MG/ML IV SOLN
1000.0000 mg | Freq: Once | INTRAVENOUS | Status: AC
  Administered 2018-04-11: 1000 mg via INTRAVENOUS
  Filled 2018-04-11: qty 100

## 2018-04-11 MED ORDER — SODIUM CHLORIDE 0.9 % IV SOLN
1000.0000 mL | Freq: Once | INTRAVENOUS | Status: AC
  Administered 2018-04-11: 1000 mL via INTRAVENOUS

## 2018-04-11 MED ORDER — GLIMEPIRIDE 4 MG PO TABS
4.0000 mg | ORAL_TABLET | Freq: Every day | ORAL | Status: DC
  Administered 2018-04-11: 4 mg via ORAL
  Filled 2018-04-11: qty 1

## 2018-04-11 MED ORDER — SODIUM CHLORIDE 0.9 % IV SOLN
500.0000 mg | INTRAVENOUS | Status: DC
  Administered 2018-04-11: 500 mg via INTRAVENOUS
  Filled 2018-04-11: qty 500

## 2018-04-11 MED ORDER — SODIUM CHLORIDE 0.9 % IV SOLN
2.0000 g | Freq: Four times a day (QID) | INTRAVENOUS | Status: DC
  Administered 2018-04-11 – 2018-04-14 (×10): 2 g via INTRAVENOUS
  Filled 2018-04-11: qty 2
  Filled 2018-04-11: qty 2000
  Filled 2018-04-11 (×2): qty 2
  Filled 2018-04-11 (×2): qty 2000
  Filled 2018-04-11: qty 2
  Filled 2018-04-11: qty 2000
  Filled 2018-04-11 (×5): qty 2

## 2018-04-11 MED ORDER — SILVER SULFADIAZINE 1 % EX CREA
1.0000 "application " | TOPICAL_CREAM | Freq: Two times a day (BID) | CUTANEOUS | Status: DC
  Administered 2018-04-11 – 2018-04-13 (×4): 1 via TOPICAL
  Filled 2018-04-11 (×2): qty 85

## 2018-04-11 MED ORDER — SODIUM CHLORIDE 0.9% FLUSH: 3.0000 mL | Freq: Once | INTRAVENOUS | Status: DC

## 2018-04-11 MED ORDER — PANTOPRAZOLE SODIUM 40 MG PO TBEC
40.0000 mg | DELAYED_RELEASE_TABLET | Freq: Every day | ORAL | Status: DC
  Administered 2018-04-11 – 2018-04-12 (×2): 40 mg via ORAL
  Filled 2018-04-11 (×3): qty 1

## 2018-04-11 MED ORDER — METHADONE HCL 10 MG PO TABS
5.0000 mg | ORAL_TABLET | Freq: Four times a day (QID) | ORAL | Status: DC
  Administered 2018-04-11 – 2018-04-16 (×16): 5 mg via ORAL
  Filled 2018-04-11 (×18): qty 1

## 2018-04-11 MED ORDER — ACETAMINOPHEN 325 MG PO TABS: 650.0000 mg | ORAL_TABLET | ORAL | Status: DC | PRN

## 2018-04-11 MED ORDER — SACCHAROMYCES BOULARDII 250 MG PO CAPS
250.0000 mg | ORAL_CAPSULE | Freq: Every day | ORAL | Status: DC
  Administered 2018-04-11 – 2018-04-15 (×5): 250 mg via ORAL
  Filled 2018-04-11 (×7): qty 1

## 2018-04-11 MED ORDER — NALOXONE HCL 4 MG/0.1ML NA LIQD: 4.0000 mg | Freq: Once | NASAL | Status: DC

## 2018-04-11 MED ORDER — MAGNESIUM GLUCONATE 500 MG PO TABS
500.0000 mg | ORAL_TABLET | Freq: Every day | ORAL | Status: DC
  Administered 2018-04-11 – 2018-04-15 (×5): 500 mg via ORAL
  Filled 2018-04-11 (×7): qty 1

## 2018-04-11 MED ORDER — SODIUM CHLORIDE 0.9 % IV SOLN: 1.0000 g | INTRAVENOUS | Status: DC

## 2018-04-11 MED ORDER — FLUTICASONE PROPIONATE 50 MCG/ACT NA SUSP
1.0000 | Freq: Every day | NASAL | Status: DC
  Administered 2018-04-11 – 2018-04-16 (×6): 1 via NASAL
  Filled 2018-04-11: qty 16

## 2018-04-11 MED ORDER — SODIUM CHLORIDE 0.45 % IV SOLN
INTRAVENOUS | Status: AC
  Administered 2018-04-11: 13:00:00 via INTRAVENOUS

## 2018-04-11 MED ORDER — TRIAMCINOLONE ACETONIDE 0.1 % EX CREA
1.0000 "application " | TOPICAL_CREAM | Freq: Two times a day (BID) | CUTANEOUS | Status: DC
  Administered 2018-04-11 – 2018-04-13 (×4): 1 via TOPICAL
  Filled 2018-04-11 (×2): qty 15

## 2018-04-11 MED ORDER — ORAL CARE MOUTH RINSE
15.0000 mL | Freq: Two times a day (BID) | OROMUCOSAL | Status: DC
  Administered 2018-04-13 – 2018-04-16 (×5): 15 mL via OROMUCOSAL

## 2018-04-11 MED ORDER — SODIUM CHLORIDE 0.9 % IV SOLN
500.0000 mg | INTRAVENOUS | Status: DC
  Filled 2018-04-11: qty 500

## 2018-04-11 MED ORDER — INSULIN GLARGINE 100 UNIT/ML ~~LOC~~ SOLN
10.0000 [IU] | Freq: Every evening | SUBCUTANEOUS | Status: DC
  Administered 2018-04-11: 10 [IU] via SUBCUTANEOUS
  Filled 2018-04-11: qty 0.1

## 2018-04-11 MED ORDER — ASPIRIN EC 81 MG PO TBEC
81.0000 mg | DELAYED_RELEASE_TABLET | Freq: Every day | ORAL | Status: DC
  Administered 2018-04-11 – 2018-04-15 (×5): 81 mg via ORAL
  Filled 2018-04-11 (×5): qty 1

## 2018-04-11 MED ORDER — VITAMIN C 500 MG PO TABS
250.0000 mg | ORAL_TABLET | Freq: Every day | ORAL | Status: DC
  Administered 2018-04-11 – 2018-04-15 (×5): 250 mg via ORAL
  Filled 2018-04-11 (×5): qty 1

## 2018-04-11 MED ORDER — ENOXAPARIN SODIUM 40 MG/0.4ML ~~LOC~~ SOLN: 40.0000 mg | SUBCUTANEOUS | Status: DC

## 2018-04-11 MED ORDER — SODIUM CHLORIDE 0.9 % IV SOLN
2.0000 g | INTRAVENOUS | Status: DC
  Administered 2018-04-11: 2 g via INTRAVENOUS
  Filled 2018-04-11: qty 20

## 2018-04-11 MED ORDER — PROMETHAZINE HCL 25 MG PO TABS
12.5000 mg | ORAL_TABLET | Freq: Four times a day (QID) | ORAL | Status: DC | PRN
  Administered 2018-04-15: 12.5 mg via ORAL
  Filled 2018-04-11 (×4): qty 1

## 2018-04-11 MED ORDER — ENOXAPARIN SODIUM 40 MG/0.4ML ~~LOC~~ SOLN
40.0000 mg | SUBCUTANEOUS | Status: DC
  Administered 2018-04-11: 40 mg via SUBCUTANEOUS
  Filled 2018-04-11: qty 0.4

## 2018-04-11 MED ORDER — ACETAMINOPHEN 650 MG RE SUPP
650.0000 mg | Freq: Once | RECTAL | Status: AC
  Administered 2018-04-11: 650 mg via RECTAL

## 2018-04-11 MED ORDER — FLUCONAZOLE IN SODIUM CHLORIDE 200-0.9 MG/100ML-% IV SOLN
200.0000 mg | INTRAVENOUS | Status: DC
  Administered 2018-04-11 – 2018-04-12 (×3): 200 mg via INTRAVENOUS
  Filled 2018-04-11 (×5): qty 100

## 2018-04-11 MED ORDER — ADULT MULTIVITAMIN W/MINERALS CH
ORAL_TABLET | Freq: Every day | ORAL | Status: DC
  Administered 2018-04-12 – 2018-04-15 (×4): 1 via ORAL
  Filled 2018-04-11 (×5): qty 1

## 2018-04-11 MED ORDER — SODIUM CHLORIDE 0.9 % IV SOLN: 500.0000 mg | INTRAVENOUS | Status: DC

## 2018-04-11 MED ORDER — FERROUS GLUCONATE 324 (38 FE) MG PO TABS
324.0000 mg | ORAL_TABLET | Freq: Every day | ORAL | Status: DC
  Administered 2018-04-11 – 2018-04-15 (×5): 324 mg via ORAL
  Filled 2018-04-11 (×7): qty 1

## 2018-04-11 MED ORDER — SODIUM CHLORIDE 0.9 % IV SOLN
1.0000 g | INTRAVENOUS | Status: DC
  Filled 2018-04-11: qty 10

## 2018-04-11 MED ORDER — DOCUSATE SODIUM 100 MG PO CAPS
100.0000 mg | ORAL_CAPSULE | Freq: Two times a day (BID) | ORAL | Status: DC | PRN
  Administered 2018-04-13: 200 mg via ORAL
  Filled 2018-04-11: qty 2

## 2018-04-11 NOTE — Progress Notes (Signed)
Spoke with Scheryl Darter, patients daughter. Due to no family present at the time of admission, password education provided to family member and established password "[email or web address]".

## 2018-04-11 NOTE — ED Notes (Signed)
Scheryl Darter (daughter) [phone]

## 2018-04-11 NOTE — Progress Notes (Signed)
Family Meeting Note  Advance Directive:yes  Today a meeting took place with the Patient.  Patient is unable to participate due JQ:GBEEFE capacity confusion   The following clinical team members were present during this meeting:MD  The following were discussed:Patient's diagnosis: 85 year old female with chronic diabetes, hypertension, multiple sclerosis, history of foot osteomyelitis status post amputation, presenting with fevers, altered mental status, confusion, found to have community-acquired pneumonia, acute dehydration, acute kidney injury, no family available, patient able to state she wants to be full code, Patient's progosis: Unable to determine and Goals for treatment: Full Code  Additional follow-up to be provided: prn  Time spent during discussion:20 minutes  Bertrum Sol, MD

## 2018-04-11 NOTE — Progress Notes (Signed)
CODE SEPSIS - PHARMACY COMMUNICATION  **Broad Spectrum Antibiotics should be administered within 1 hour of Sepsis diagnosis**  Time Code Sepsis Called/Page Received: 0715 (based on consult)  Antibiotics Ordered: ceftriaxone and azithromycin  Time of 1st antibiotic administration: 0824   Additional action taken by pharmacy: Called RN at [phone] (no answer); Called MD [phone] about orders  If necessary, Name of Provider/Nurse Contacted: Dr Johna Sheriff ,PharmD Clinical Pharmacist  04/11/2018  8:04 AM

## 2018-04-11 NOTE — Progress Notes (Addendum)
Pt diaphoretic with chills and moaning with generalized pain. Temp 103.2,  Pt A&O X1 and unable to give much history or information. Md notified, no flu swab completed at this time. Awaiting new orders.

## 2018-04-11 NOTE — Progress Notes (Signed)
PHARMACY - PHYSICIAN COMMUNICATION CRITICAL VALUE ALERT - BLOOD CULTURE IDENTIFICATION (BCID)  Stefanie Pham is an 85 y.o. female who presented to [facility] on 04/11/2018 with a chief complaint of fever  Assessment:  Tmax 103.2, PCT 0.79, WBC 12.6, CXR atelectasis vs. Infiltrate, 2/4 GPC BCID Enterococcus  Name of physician (or Provider) Contacted: Will Bonnet  Current antibiotics: Azithromycin/Ceftriaxone/Fluconazole  Changes to prescribed antibiotics recommended:  Recommendations accepted by provider -- Will discontinue Azithromycin/ceftriaxone and switch to ampicillin 2g IV q6h for possible Enterococcus bacteremia. Will defer fluconazole to ID.  Results for orders placed or performed during the hospital encounter of 04/11/18  Blood Culture ID Panel (Reflexed) (Collected: 04/11/2018  7:32 AM)  Result Value Ref Range   Enterococcus species DETECTED (A) NOT DETECTED   Vancomycin resistance NOT DETECTED NOT DETECTED   Listeria monocytogenes NOT DETECTED NOT DETECTED   Staphylococcus species NOT DETECTED NOT DETECTED   Staphylococcus aureus (BCID) NOT DETECTED NOT DETECTED   Streptococcus species NOT DETECTED NOT DETECTED   Streptococcus agalactiae NOT DETECTED NOT DETECTED   Streptococcus pneumoniae NOT DETECTED NOT DETECTED   Streptococcus pyogenes NOT DETECTED NOT DETECTED   Acinetobacter baumannii NOT DETECTED NOT DETECTED   Enterobacteriaceae species NOT DETECTED NOT DETECTED   Enterobacter cloacae complex NOT DETECTED NOT DETECTED   Escherichia coli NOT DETECTED NOT DETECTED   Klebsiella oxytoca NOT DETECTED NOT DETECTED   Klebsiella pneumoniae NOT DETECTED NOT DETECTED   Proteus species NOT DETECTED NOT DETECTED   Serratia marcescens NOT DETECTED NOT DETECTED   Haemophilus influenzae NOT DETECTED NOT DETECTED   Neisseria meningitidis NOT DETECTED NOT DETECTED   Pseudomonas aeruginosa NOT DETECTED NOT DETECTED   Candida albicans NOT DETECTED NOT DETECTED   Candida  glabrata NOT DETECTED NOT DETECTED   Candida krusei NOT DETECTED NOT DETECTED   Candida parapsilosis NOT DETECTED NOT DETECTED   Candida tropicalis NOT DETECTED NOT DETECTED   Thomasene Ripple, PharmD, BCPS Clinical Pharmacist 04/11/2018

## 2018-04-11 NOTE — ED Triage Notes (Signed)
Pt arrives via [facility] with AMS which is a new onset. Per EMS, pt has temp 102.4 oral and pt reported to EMS that they gave 1000 mg tylenol at 0500 this AM. Family denied contact with other sick persons at this time. Pt is able to ta answer birthday and name at this time but no other information.

## 2018-04-11 NOTE — ED Notes (Signed)
ED TO INPATIENT HANDOFF REPORT  ED Nurse Name and Phone #:  Jae Dire [phone]  S Name/Age/Gender Stefanie Pham 85 y.o. female Room/Bed: ED32A/ED32A  Code Status   Code Status: Prior  Home/SNF/Other Home Patient oriented to: self Is this baseline? Yes   Triage Complete: Triage complete  Chief Complaint [facility] Flu Like Symptoms  Triage Note Pt arrives via [facility] with AMS which is a new onset. Per EMS, pt has temp 102.4 oral and pt reported to EMS that they gave 1000 mg tylenol at 0500 this AM. Family denied contact with other sick persons at this time. Pt is able to ta answer birthday and name at this time but no other information.    Allergies Allergies  Allergen Reactions  . Hydralazine Hcl Other (See Comments)    Severe hyponatremia  . Morphine And Related Shortness Of Breath  . Codeine Nausea And Vomiting    Level of Care/Admitting Diagnosis ED Disposition    ED Disposition Condition Comment   Admit  Hospital Area: [facility] [[id number]]  Level of Care: Med-Surg [16]  Diagnosis: CAP (community acquired pneumonia) [948546]  Admitting Physician: Bertrum Sol [[id number]]  Attending Physician: Bertrum Sol [[id number]]  Estimated length of stay: past midnight tomorrow  Certification:: I certify this patient will need inpatient services for at least 2 midnights  Bed request comments: Low risk for coronavirus  PT Class (Do Not Modify): Inpatient [101]  PT Acc Code (Do Not Modify): Private [1]       B Medical/Surgery History Past Medical History:  Diagnosis Date  . Diabetes mellitus without complication (HCC)   . Diverticulitis   . Hernia, inguinal   . Hypertension   . Multiple sclerosis (HCC)    Past Surgical History:  Procedure Laterality Date  . ABDOMINAL HYSTERECTOMY    . AMPUTATION TOE Left 03/11/2018   Procedure: AMPUTATION TOE LEFT GREAT TOE;  Surgeon: Linus Galas, DPM;  Location: [facility] ORS;  Service: Podiatry;  Laterality: Left;   . APPENDECTOMY    . CERVICAL SPINE SURGERY N/A   . CHOLECYSTECTOMY    . HERNIA REPAIR    . ORIF ANKLE FRACTURE Left   . REPLACEMENT TOTAL KNEE Left 2     A IV Location/Drains/Wounds Patient Lines/Drains/Airways Status   Active Line/Drains/Airways    Name:   Placement date:   Placement time:   Site:   Days:   Peripheral IV 04/11/18 Left Hand   04/11/18    0700    Hand   less than 1   Peripheral IV 04/11/18 Right Hand   04/11/18    0743    Hand   less than 1   External Urinary Catheter   04/11/18    0836    -   less than 1   Incision (Closed) 03/11/18 Foot Left   03/11/18    2040     31          Intake/Output Last 24 hours  Intake/Output Summary (Last 24 hours) at 04/11/2018 1104 Last data filed at 04/11/2018 0854 Gross per 24 hour  Intake 141.28 ml  Output -  Net 141.28 ml    Labs/Imaging Results for orders placed or performed during the hospital encounter of 04/11/18 (from the past 48 hour(s))  Comprehensive metabolic panel     Status: Abnormal   Collection Time: 04/11/18  7:30 AM  Result Value Ref Range   Sodium 135 135 - 145 mmol/L   Potassium 5.1 3.5 -  5.1 mmol/L   Chloride 103 98 - 111 mmol/L   CO2 25 22 - 32 mmol/L   Glucose, Bld 157 (H) 70 - 99 mg/dL   BUN 21 8 - 23 mg/dL   Creatinine, Ser 0.94 (H) 0.44 - 1.00 mg/dL   Calcium 8.6 (L) 8.9 - 10.3 mg/dL   Total Protein 7.5 6.5 - 8.1 g/dL   Albumin 3.0 (L) 3.5 - 5.0 g/dL   AST 32 15 - 41 U/L   ALT 18 0 - 44 U/L   Alkaline Phosphatase 52 38 - 126 U/L   Total Bilirubin 0.4 0.3 - 1.2 mg/dL   GFR calc non Af Amer 30 (L) >60 mL/min   GFR calc Af Amer 35 (L) >60 mL/min   Anion gap 7 5 - 15    Comment: Performed at [facility], [address] [place], [place] [address]  CBC with Differential     Status: Abnormal   Collection Time: 04/11/18  7:30 AM  Result Value Ref Range   WBC 12.6 (H) 4.0 - 10.5 K/uL   RBC 3.22 (L) 3.87 - 5.11 MIL/uL   Hemoglobin 10.3 (L) 12.0 - 15.0 g/dL   HCT 88.1 (L) 10.3 -  46.0 %   MCV 99.4 80.0 - 100.0 fL   MCH 32.0 26.0 - 34.0 pg   MCHC 32.2 30.0 - 36.0 g/dL   RDW 15.9 (H) 45.8 - 59.2 %   Platelets 208 150 - 400 K/uL   nRBC 0.0 0.0 - 0.2 %   Neutrophils Relative % 86 %   Neutro Abs 10.8 (H) 1.7 - 7.7 K/uL   Lymphocytes Relative 8 %   Lymphs Abs 1.0 0.7 - 4.0 K/uL   Monocytes Relative 6 %   Monocytes Absolute 0.7 0.1 - 1.0 K/uL   Eosinophils Relative 0 %   Eosinophils Absolute 0.0 0.0 - 0.5 K/uL   Basophils Relative 0 %   Basophils Absolute 0.0 0.0 - 0.1 K/uL   Immature Granulocytes 0 %   Abs Immature Granulocytes 0.05 0.00 - 0.07 K/uL    Comment: Performed at [facility], [address] [place], [place] [address]  Protime-INR     Status: None   Collection Time: 04/11/18  7:30 AM  Result Value Ref Range   Prothrombin Time 14.3 11.4 - 15.2 seconds   INR 1.1 0.8 - 1.2    Comment: (NOTE) INR goal varies based on device and disease states. Performed at [facility], [address] [place], [place] [address]   Lactic acid, plasma     Status: None   Collection Time: 04/11/18  9:12 AM  Result Value Ref Range   Lactic Acid, Venous 1.0 0.5 - 1.9 mmol/L    Comment: Performed at [facility], [address] [place], [place] [address]   Dg Chest Port 1 View  Result Date: 04/11/2018 CLINICAL DATA:  Fever.  Sepsis. EXAM: PORTABLE CHEST 1 VIEW COMPARISON:  Radiographs of March 11, 2018. FINDINGS: Stable cardiomediastinal silhouette. No pneumothorax or pleural effusion is noted. Right lung is clear. Mild left basilar subsegmental atelectasis or infiltrate is noted. The visualized skeletal structures are unremarkable. IMPRESSION: Mild left basilar subsegmental atelectasis or infiltrate. Electronically Signed   By: Lupita Raider, M.D.   On: 04/11/2018 07:48    Pending Labs Unresulted Labs (From admission, onward)    Start     Ordered   04/11/18 1056  Ammonia  Once,   STAT     04/11/18  1055   04/11/18 1056  RPR  Once,    STAT     04/11/18 1055   04/11/18 0716  Urinalysis, Routine w reflex microscopic  ONCE - STAT,   STAT     04/11/18 0715   04/11/18 0716  Urine culture  ONCE - STAT,   STAT     04/11/18 0715   04/11/18 0712  Culture, blood (Routine x 2)  BLOOD CULTURE X 2,   STAT     04/11/18 0712   04/11/18 0712  Urinalysis, Complete w Microscopic  ONCE - STAT,   STAT     04/11/18 [id number]   Signed and Held  Culture, blood (routine x 2) Call MD if unable to obtain prior to antibiotics being given  BLOOD CULTURE X 2,   R    Comments:  If blood cultures drawn in Emergency Department - Do not draw and cancel order    Signed and Held   Signed and Held  Culture, sputum-assessment  Once,   R     Signed and Held   Signed and Held  Gram stain  Once,   R     Signed and Held   Signed and Held  HIV antibody (Routine Screening)  Once,   R     Signed and Held   Signed and Held  Strep pneumoniae urinary antigen  Once,   R     Signed and Held   Signed and Held  CBC  (enoxaparin (LOVENOX)    CrCl >/= 30 ml/min)  Once,   R    Comments:  Baseline for enoxaparin therapy IF NOT ALREADY DRAWN.  Notify MD if PLT < 100 K.    Signed and Held   Signed and Held  Creatinine, serum  (enoxaparin (LOVENOX)    CrCl >/= 30 ml/min)  Once,   R    Comments:  Baseline for enoxaparin therapy IF NOT ALREADY DRAWN.    Signed and Held   Signed and Held  Creatinine, serum  (enoxaparin (LOVENOX)    CrCl >/= 30 ml/min)  Weekly,   R    Comments:  while on enoxaparin therapy    Signed and Held   Signed and Held  Legionella Pneumophila Serogp 1 Ur Ag  Once,   R     Signed and Held          Vitals/Pain Today's Vitals   04/11/18 0859 04/11/18 0911 04/11/18 0946 04/11/18 1045  BP:  (!) 112/99    Pulse:  78 72 76  Resp:  16 12 20   Temp: 98 F (36.7 C)     TempSrc: Oral     SpO2:  99% 96% 97%  Weight:      Height:        Isolation Precautions No active isolations  Medications Medications  cefTRIAXone (ROCEPHIN) 2 g in sodium  chloride 0.9 % 100 mL IVPB (0 g Intravenous Stopped 04/11/18 0855)  azithromycin (ZITHROMAX) 500 mg in sodium chloride 0.9 % 250 mL IVPB (0 mg Intravenous Stopped 04/11/18 0931)  0.9 %  sodium chloride infusion (1,000 mLs Intravenous New Bag/Given 04/11/18 0823)    Mobility Unsure how pt normally walks. Needed assistance to roll in bed  High fall risk   Focused Assessments    R Recommendations: See Admitting Provider Note  Report given to:   Additional Notes:  Bjorn LoserRhonda, daughter, [phone]. On 2L [place], does NOT wear oxygen at home.

## 2018-04-11 NOTE — H&P (Signed)
[place] [facility] [place] NAME: Stefanie Pham    MR#:  [id number]  DATE OF BIRTH:  [birth year]  DATE OF ADMISSION:  04/11/2018  PRIMARY CARE PHYSICIAN: Clarisse Gouge, MD   REQUESTING/REFERRING PHYSICIAN:   CHIEF COMPLAINT:   Chief Complaint  Patient presents with  . Fever    HISTORY OF PRESENT ILLNESS: Stefanie Pham  is a 85 y.o. female with a known history per below, presenting from home via EMS with acute fever, altered mental status, confusion, in the emergency room patient was found to have left pneumonia on chest x-ray, white count 12,000, creatinine 1.5 with baseline normal, patient evaluated the bedside emergency room, no apparent distress, resting comfortably in bed, patient is poor historian due to confusion, per ED attending patient is low risk for coronavirus infection given no sick contacts, no family currently available, patient now be admitted for acute left kidney acquired pneumonia, acute kidney injury due to dehydration  PAST MEDICAL HISTORY:   Past Medical History:  Diagnosis Date  . Diabetes mellitus without complication ([place])   . Diverticulitis   . Hernia, inguinal   . Hypertension   . Multiple sclerosis ([place])     PAST SURGICAL HISTORY:  Past Surgical History:  Procedure Laterality Date  . ABDOMINAL HYSTERECTOMY    . AMPUTATION TOE Left 03/11/2018   Procedure: AMPUTATION TOE LEFT GREAT TOE;  Surgeon: Sharlotte Alamo, DPM;  Location: [facility] ORS;  Service: Podiatry;  Laterality: Left;  . APPENDECTOMY    . CERVICAL SPINE SURGERY N/A   . CHOLECYSTECTOMY    . HERNIA REPAIR    . ORIF ANKLE FRACTURE Left   . REPLACEMENT TOTAL KNEE Left 2    SOCIAL HISTORY:  Social History   Tobacco Use  . Smoking status: Never Smoker  . Smokeless tobacco: Never Used  Substance Use Topics  . Alcohol use: No    FAMILY HISTORY:  Family History  Problem Relation Age of Onset  . COPD Father     DRUG ALLERGIES:  Allergies  Allergen  Reactions  . Hydralazine Hcl Other (See Comments)    Severe hyponatremia  . Morphine And Related Shortness Of Breath  . Codeine Nausea And Vomiting    REVIEW OF SYSTEMS: Unable to be obtained given acute confusion, no family available  CONSTITUTIONAL: No fever, fatigue or weakness.  EYES: No blurred or double vision.  EARS, NOSE, AND THROAT: No tinnitus or ear pain.  RESPIRATORY: No cough, shortness of breath, wheezing or hemoptysis.  CARDIOVASCULAR: No chest pain, orthopnea, edema.  GASTROINTESTINAL: No nausea, vomiting, diarrhea or abdominal pain.  GENITOURINARY: No dysuria, hematuria.  ENDOCRINE: No polyuria, nocturia,  HEMATOLOGY: No anemia, easy bruising or bleeding SKIN: No rash or lesion. MUSCULOSKELETAL: No joint pain or arthritis.   NEUROLOGIC: No tingling, numbness, weakness.  PSYCHIATRY: No anxiety or depression.   MEDICATIONS AT HOME:  Prior to Admission medications   Medication Sig Start Date End Date Taking? Authorizing Provider  Ascorbic Acid 125 MG CHEW Chew 125 mg by mouth daily.    [provider]  aspirin EC 81 MG tablet Take 81 mg by mouth at bedtime.    [provider]  benazepril (LOTENSIN) 40 MG tablet Take 40 mg by mouth daily.    [provider]  Bioflavonoid Products (VITAMIN C) CHEW Chew 1 each by mouth at bedtime.    [provider]  Calcium Carbonate-Vitamin D (CALCIUM 500/D PO) Take 1 tablet by mouth daily.  [provider]  Cholecalciferol (HM VITAMIN D3) 4000 UNITS CAPS Take 1 capsule by mouth at bedtime.    [provider]  diphenhydrAMINE (BENADRYL) 25 mg capsule Take 50 mg by mouth at bedtime as needed for sleep.    [provider]  docusate sodium (COLACE) 100 MG capsule Take 100-200 mg by mouth 2 (two) times daily as needed for mild constipation.    [provider]  ferrous gluconate (FERGON) 324 MG tablet Take 1 tablet by mouth daily. 01/21/18   [provider]   gabapentin (NEURONTIN) 400 MG capsule Take 400 mg by mouth 4 (four) times daily.  12/29/17   [provider]  glimepiride (AMARYL) 4 MG tablet Take 4 mg by mouth daily. 03/09/18 03/09/19  [provider]  Insulin Glargine (LANTUS SOLOSTAR) 100 UNIT/ML Solostar Pen Inject 10 Units into the skin Nightly. 01/19/18 01/15/2019  [provider]  interferon beta-1a (AVONEX) 30 MCG injection Inject 30 mcg into the muscle once a week.    [provider]  Lactobacillus Acid-Pectin (EQL PROBIOTIC ACIDOPHILUS) CAPS Take 1 capsule by mouth daily.    [provider]  magnesium gluconate (MAGONATE) 500 MG tablet Take 500 mg by mouth daily.    [provider]  methadone (DOLOPHINE) 5 MG tablet Take 5 mg by mouth every 6 (six) hours.    [provider]  mometasone (NASONEX) 50 MCG/ACT nasal spray Place 2 sprays into the nose at bedtime.    [provider]  Multiple Vitamins-Minerals (MULTIVITAMIN PO) Take 2 each by mouth at bedtime.    [provider]  NARCAN 4 MG/0.1ML LIQD nasal spray kit Place 4 mg into the nose once. 10/30/17   [provider]  omeprazole (PRILOSEC) 20 MG capsule Take 20 mg by mouth daily.    [provider]  promethazine (PHENERGAN) 12.5 MG tablet Take 12.5 mg by mouth every 6 (six) hours as needed for nausea or vomiting.    [provider]  Pyridoxine HCl (VITAMIN B-6 PO) Take 1 tablet by mouth at bedtime.    [provider]  silver sulfADIAZINE (SILVADENE) 1 % cream Apply 1 application topically 2 (two) times daily. 01/15/18   [provider]  triamcinolone cream (KENALOG) 0.1 % Apply 1 application topically 2 (two) times daily. 06/30/17 06/30/18  [provider]      PHYSICAL EXAMINATION:   VITAL SIGNS: Blood pressure 105/73, pulse 71, temperature 100.2 F (37.9 C), temperature source Oral, resp. rate 14, height '5\' 6"'$  (1.676 m), weight 90.7 kg, SpO2 100  %.  GENERAL:  85 y.o.-year-old patient lying in the bed with no acute distress.  Frail-appearing EYES: Pupils equal, round, reactive to light and accommodation. No scleral icterus. Extraocular muscles intact.  HEENT: Head atraumatic, normocephalic. Oropharynx and nasopharynx clear.  NECK:  Supple, no jugular venous distention. No thyroid enlargement, no tenderness.  LUNGS: Normal breath sounds bilaterally, no wheezing, rales,rhonchi or crepitation. No use of accessory muscles of respiration.  CARDIOVASCULAR: S1, S2 normal. No murmurs, rubs, or gallops.  ABDOMEN: Soft, nontender, nondistended. Bowel sounds present. No organomegaly or mass.  EXTREMITIES: No pedal edema, cyanosis, or clubbing.  NEUROLOGIC: Cranial nerves II through XII are intact. MAES. Gait not checked.  PSYCHIATRIC: The patient is confused and disoriented   SKIN: No obvious rash, lesion, or ulcer.   LABORATORY PANEL:   CBC Recent Labs  Lab 04/11/18 0730  WBC 12.6*  HGB 10.3*  HCT 32.0*  PLT 208  MCV 99.4  MCH 32.0  MCHC 32.2  RDW 15.6*  LYMPHSABS 1.0  MONOABS 0.7  EOSABS 0.0  BASOSABS 0.0   ------------------------------------------------------------------------------------------------------------------  Chemistries  Recent Labs  Lab 04/11/18 0730  NA 135  K 5.1  CL 103  CO2 25  GLUCOSE 157*  BUN 21  CREATININE 1.58*  CALCIUM 8.6*  AST 32  ALT 18  ALKPHOS 52  BILITOT 0.4   ------------------------------------------------------------------------------------------------------------------ estimated creatinine clearance is 31.2 mL/min (A) (by C-G formula based on SCr of 1.58 mg/dL (H)). ------------------------------------------------------------------------------------------------------------------ No results for input(s): TSH, T4TOTAL, T3FREE, THYROIDAB in the last 72 hours.  Invalid input(s): FREET3   Coagulation profile Recent Labs  Lab 04/11/18 0730  INR 1.1    ------------------------------------------------------------------------------------------------------------------- No results for input(s): DDIMER in the last 72 hours. -------------------------------------------------------------------------------------------------------------------  Cardiac Enzymes No results for input(s): CKMB, TROPONINI, MYOGLOBIN in the last 168 hours.  Invalid input(s): CK ------------------------------------------------------------------------------------------------------------------ Invalid input(s): POCBNP  ---------------------------------------------------------------------------------------------------------------  Urinalysis    Component Value Date/Time   COLORURINE YELLOW (A) 03/11/2018 1332   APPEARANCEUR CLOUDY (A) 03/11/2018 1332   LABSPEC 1.015 03/11/2018 1332   PHURINE 7.0 03/11/2018 1332   GLUCOSEU NEGATIVE 03/11/2018 1332   HGBUR NEGATIVE 03/11/2018 1332   BILIRUBINUR NEGATIVE 03/11/2018 1332   KETONESUR NEGATIVE 03/11/2018 1332   PROTEINUR 30 (A) 03/11/2018 1332   NITRITE NEGATIVE 03/11/2018 1332   LEUKOCYTESUR LARGE (A) 03/11/2018 1332     RADIOLOGY: Dg Chest Port 1 View  Result Date: 04/11/2018 CLINICAL DATA:  Fever.  Sepsis. EXAM: PORTABLE CHEST 1 VIEW COMPARISON:  Radiographs of March 11, 2018. FINDINGS: Stable cardiomediastinal silhouette. No pneumothorax or pleural effusion is noted. Right lung is clear. Mild left basilar subsegmental atelectasis or infiltrate is noted. The visualized skeletal structures are unremarkable. IMPRESSION: Mild left basilar subsegmental atelectasis or infiltrate. Electronically Signed   By: Marijo Conception, M.D.   On: 04/11/2018 07:48    EKG: Orders placed or performed during the hospital encounter of 04/11/18  . EKG 12-Lead  . EKG 12-Lead    IMPRESSION AND PLAN: *Acute left community-acquired pneumonia Pneumonia protocol, empiric Rocephin/azithromycin, follow-up on cultures  *Acute kidney  injury most likely secondary to dehydration IV fluids for rehydration, avoid nephrotoxic agents, hold ACE inhibitor, BMP in the morning  *Acute confusion/altered mental status Most likely secondary to combination of acute pneumonia, dehydration, and polypharmacy Hold psychotropic meds, aspiration/fall precautions while in house, neurochecks per routine, check ammonia level, RPR  *Chronic diabetes mellitus type 2 Continue home regiment, sliding scale insulin Accu-Cheks per routine  *Chronic hypertension Continue home regiment  *Chronic multiple sclerosis Increase nursing care PRN, aspiration/fall/skin care precautions while in house   All the records are reviewed and case discussed with ED provider. Management plans discussed with the patient, family and they are in agreement.  CODE STATUS:full Code Status History    Date Active Date Inactive Code Status Order ID Comments User Context   03/11/2018 1501 03/13/2018 1734 Full Code [id number]  Demetrios Loll, MD Inpatient   07/01/2014 1824 07/04/2014 1847 Full Code [id number]  Loletha Grayer, MD ED       TOTAL TIME TAKING CARE OF THIS PATIENT: 40 minutes.    Avel Peace Salary M.D on 04/11/2018   Between 7am to 6pm - Pager - [phone]  After 6pm go to [email or web address] - password [email or web address] [facility]  Office  [phone]  CC: Primary care physician; Clarisse Gouge, MD   Note: This dictation was prepared with Dragon dictation along with smaller phrase technology.  Any transcriptional errors that result from this process are unintentional.

## 2018-04-11 NOTE — Consult Note (Signed)
Name: Stefanie Pham MRN: [id number] DOB: 28-Mar-1933     CONSULTATION DATE: 04/11/2018  REFERRING MD : Jerelyn Charles  CHIEF COMPLAINT: fever and mental status changes   HISTORY OF PRESENT ILLNESS: 85 y.o. female with history of diabetes who presents with reports of fever, altered mental status.  Family not available at this time EMS reports patient with some confusion, fever this morning family gave Tylenol.    Patient is unable to provide significant history.    Review of medical records demonstrates the patient was recently treated for osteomyelitis of the left great toe with partial amputation at the end of February.   Transferring to [place] for assessment for COVID Patient placed on minimal oxygen +coughing and spitting everywhere   B/l groin redness  RT leg cellulitis +painful to touch  PAST MEDICAL HISTORY :   has a past medical history of Diabetes mellitus without complication ([place]), Diverticulitis, Hernia, inguinal, Hypertension, and Multiple sclerosis ([place]).  has a past surgical history that includes Cholecystectomy; Appendectomy; Replacement total knee (Left, 2); ORIF ankle fracture (Left); Abdominal hysterectomy; Hernia repair; Cervical spine surgery (N/A); and Amputation toe (Left, 03/11/2018). Prior to Admission medications   Medication Sig Start Date End Date Taking? Authorizing Provider  gabapentin (NEURONTIN) 300 MG capsule Take 300-600 mg by mouth 5 (five) times daily. 04/10/18  Yes [provider]  Ascorbic Acid 125 MG CHEW Chew 125 mg by mouth daily.    [provider]  aspirin EC 81 MG tablet Take 81 mg by mouth at bedtime.    [provider]  benazepril (LOTENSIN) 40 MG tablet Take 40 mg by mouth daily.    [provider]  Bioflavonoid Products (VITAMIN C) CHEW Chew 1 each by mouth at bedtime.    [provider]  Calcium Carbonate-Vitamin D (CALCIUM 500/D PO) Take 1 tablet by mouth daily.    [provider]   Cholecalciferol (HM VITAMIN D3) 4000 UNITS CAPS Take 1 capsule by mouth at bedtime.    [provider]  diphenhydrAMINE (BENADRYL) 25 mg capsule Take 50 mg by mouth at bedtime as needed for sleep.    [provider]  docusate sodium (COLACE) 100 MG capsule Take 100-200 mg by mouth 2 (two) times daily as needed for mild constipation.    [provider]  ferrous gluconate (FERGON) 324 MG tablet Take 1 tablet by mouth daily. 01/21/18   [provider]  glimepiride (AMARYL) 4 MG tablet Take 4 mg by mouth daily. 03/09/18 03/09/19  [provider]  Insulin Glargine (LANTUS SOLOSTAR) 100 UNIT/ML Solostar Pen Inject 10 Units into the skin Nightly. 01/19/18 02/09/2019  [provider]  interferon beta-1a (AVONEX) 30 MCG injection Inject 30 mcg into the muscle once a week.    [provider]  Lactobacillus Acid-Pectin (EQL PROBIOTIC ACIDOPHILUS) CAPS Take 1 capsule by mouth daily.    [provider]  magnesium gluconate (MAGONATE) 500 MG tablet Take 500 mg by mouth daily.    [provider]  methadone (DOLOPHINE) 5 MG tablet Take 5 mg by mouth every 6 (six) hours.    [provider]  mometasone (NASONEX) 50 MCG/ACT nasal spray Place 2 sprays into the nose at bedtime.    [provider]  Multiple Vitamins-Minerals (MULTIVITAMIN PO) Take 2 each by mouth at bedtime.    [provider]  NARCAN 4 MG/0.1ML LIQD nasal spray kit Place 4 mg into the nose once. 10/30/17   [provider]  omeprazole ([place])  20 MG capsule Take 20 mg by mouth daily.    [provider]  promethazine (PHENERGAN) 12.5 MG tablet Take 12.5 mg by mouth every 6 (six) hours as needed for nausea or vomiting.    [provider]  Pyridoxine HCl (VITAMIN B-6 PO) Take 1 tablet by mouth at bedtime.    [provider]  silver sulfADIAZINE (SILVADENE) 1 % cream Apply 1 application topically 2 (two) times daily. 01/15/18    [provider]  triamcinolone cream (KENALOG) 0.1 % Apply 1 application topically 2 (two) times daily. 06/30/17 06/30/18  [provider]   Allergies  Allergen Reactions  . Hydralazine Hcl Other (See Comments)    Severe hyponatremia  . Morphine And Related Shortness Of Breath  . Codeine Nausea And Vomiting    FAMILY HISTORY:  family history includes COPD in her father. SOCIAL HISTORY:  reports that she has never smoked. She has never used smokeless tobacco. She reports that she does not drink alcohol or use drugs.  REVIEW OF SYSTEMS  PATIENT IS UNABLE TO PROVIDE COMPLETE REVIEW OF SYSTEMS DUE TO ENCEPHALOPATHY     VITAL SIGNS: Temp:  [98 F (36.7 C)-103.2 F (39.6 C)] 103.2 F (39.6 C) (03/28 1400) Pulse Rate:  [71-85] 77 (03/28 1105) Resp:  [12-24] 20 (03/28 1154) BP: (105-150)/(43-123) 150/123 (03/28 1154) SpO2:  [92 %-100 %] 94 % (03/28 1154) Weight:  [90.5 kg-90.7 kg] 90.5 kg (03/28 1154)  Physical Examination:   GENERAL:NAD, no fevers, chills, no weakness no fatigue HEAD: Normocephalic, atraumatic.  EYES: Pupils equal, round, reactive to light. Extraocular muscles intact. No scleral icterus.  MOUTH: Moist mucosal membrane.   EAR, NOSE, THROAT: Clear without exudates. No external lesions.  NECK: Supple. No thyromegaly. No nodules. No JVD.  PULMONARY:CTA B/L no wheezes, no crackles, no rhonchi CARDIOVASCULAR: S1 and S2. Regular rate and rhythm. No murmurs, rubs, or gallops. No edema.  GASTROINTESTINAL: Soft, nontender, nondistended. No masses. Positive bowel sounds.  MUSCULOSKELETAL: + swelling, +erythema RT leg NEUROLOGIC: Cranial nerves II through XII are intact. No gross focal neurological deficits.  SKIN: No ulceration, lesions, rashes, or cyanosis. Skin warm and dry. Turgor intact.  PSYCHIATRIC: poor insight    CBC    Component Value Date/Time   WBC 12.6 (H) 04/11/2018 0730   RBC 3.22 (L) 04/11/2018 0730   HGB 10.3 (L) 04/11/2018 0730    HCT 32.0 (L) 04/11/2018 0730   PLT 208 04/11/2018 0730   MCV 99.4 04/11/2018 0730   MCH 32.0 04/11/2018 0730   MCHC 32.2 04/11/2018 0730   RDW 15.6 (H) 04/11/2018 0730   LYMPHSABS 1.0 04/11/2018 0730   MONOABS 0.7 04/11/2018 0730   EOSABS 0.0 04/11/2018 0730   BASOSABS 0.0 04/11/2018 0730   BMP Latest Ref Rng & Units 04/11/2018 03/13/2018 03/12/2018  Glucose 70 - 99 mg/dL 157(H) 130(H) 98  BUN 8 - 23 mg/dL 21 7(L) 25(H)  Creatinine 0.44 - 1.00 mg/dL 1.58(H) 0.67 1.28(H)  Sodium 135 - 145 mmol/L 135 144 139  Potassium 3.5 - 5.1 mmol/L 5.1 3.9 4.9  Chloride 98 - 111 mmol/L 103 116(H) 108  CO2 22 - 32 mmol/L 25 21(L) 25  Calcium 8.9 - 10.3 mg/dL 8.6(L) 8.1(L) 8.2(L)     IMAGING    Dg Chest Port 1 View  Result Date: 04/11/2018 CLINICAL DATA:  Fever.  Sepsis. EXAM: PORTABLE CHEST 1 VIEW COMPARISON:  Radiographs of March 11, 2018. FINDINGS: Stable cardiomediastinal silhouette. No pneumothorax or pleural effusion is noted. Right lung  is clear. Mild left basilar subsegmental atelectasis or infiltrate is noted. The visualized skeletal structures are unremarkable. IMPRESSION: Mild left basilar subsegmental atelectasis or infiltrate. Electronically Signed   By: Marijo Conception, M.D.   On: 04/11/2018 07:48     I have Independently reviewed images of  CXR   on 04/11/2018 Interpretation:LLL opacity   ASSESSMENT AND PLAN SYNOPSIS  COVID-19 DISASTER DECLARATION:   PATIENT seen for the symptoms described in the history of present illness. In the context of the global COVID-19 pandemic, which necessitated consideration that the patient might be at risk for infection with the SARS-CoV-2 virus that causes COVID-19.  Institutional protocols and algorithms that pertain to the evaluation of patients at risk for COVID-19 are in a state of rapid change based on information released by regulatory bodies including the CDC and federal and state organizations. These policies and algorithms were  followed during the patient's care while in hospital  Check RVP Check blood cultures Check urine Legionella and Strep pneum AG Check COVID-low risk  Hypoxia From sepsis and UTI/pneumonia/RT leg cellulitis Oxygen as needed Continue IV ABX prescribed-add vancomycin, Diflucan morphine as needed for severe pain  INFECTIOUS DISEASE -continue antibiotics as prescribed -follow up cultures -follow up ID consultation   ELECTROLYTES -follow labs as needed -replace as needed -pharmacy consultation and following    DVT/GI PRX ordered TRANSFUSIONS AS NEEDED MONITOR FSBS ASSESS the need for LABS as needed     Corrin Parker, M.D.  [facility]  Medical Director [place] Director [facility] Cardio-Pulmonary Department

## 2018-04-11 NOTE — ED Provider Notes (Signed)
[facility] Emergency Department Provider Note   ____________________________________________    I have reviewed the triage vital signs and the nursing notes.   HISTORY  Chief Complaint Fever     HPI Stefanie Pham is a 85 y.o. female with history of diabetes who presents with reports of fever, altered mental status.  Family not available at this time, EMS reports patient with some confusion, fever this morning, family gave Tylenol.  Patient is unable to provide significant history.  Review of medical records demonstrates the patient was recently treated for osteomyelitis of the left great toe with partial amputation at the end of February.  Past Medical History:  Diagnosis Date  . Diabetes mellitus without complication ([place])   . Diverticulitis   . Hernia, inguinal   . Hypertension   . Multiple sclerosis [facility])     Patient Active Problem List   Diagnosis Date Noted  . Osteomyelitis ([place]) 03/11/2018  . Hyponatremia 07/01/2014    Past Surgical History:  Procedure Laterality Date  . ABDOMINAL HYSTERECTOMY    . AMPUTATION TOE Left 03/11/2018   Procedure: AMPUTATION TOE LEFT GREAT TOE;  Surgeon: Sharlotte Alamo, DPM;  Location: [facility] ORS;  Service: Podiatry;  Laterality: Left;  . APPENDECTOMY    . CERVICAL SPINE SURGERY N/A   . CHOLECYSTECTOMY    . HERNIA REPAIR    . ORIF ANKLE FRACTURE Left   . REPLACEMENT TOTAL KNEE Left 2    Prior to Admission medications   Medication Sig Start Date End Date Taking? Authorizing Provider  amLODipine (NORVASC) 2.5 MG tablet Take 1 tablet (2.5 mg total) by mouth daily. Patient not taking: Reported on 03/11/2018 07/04/14   Gladstone Lighter, MD  Ascorbic Acid 125 MG CHEW Chew 125 mg by mouth daily.    [provider]  aspirin EC 81 MG tablet Take 81 mg by mouth at bedtime.    [provider]  benazepril (LOTENSIN) 40 MG tablet Take 40 mg by mouth daily.    [provider]   Bioflavonoid Products (VITAMIN C) CHEW Chew 1 each by mouth at bedtime.    [provider]  Calcium Carbonate-Vitamin D (CALCIUM 500/D PO) Take 1 tablet by mouth daily.    [provider]  Cholecalciferol (HM VITAMIN D3) 4000 UNITS CAPS Take 1 capsule by mouth at bedtime.    [provider]  diphenhydrAMINE (BENADRYL) 25 mg capsule Take 50 mg by mouth at bedtime as needed for sleep.    [provider]  docusate sodium (COLACE) 100 MG capsule Take 100-200 mg by mouth 2 (two) times daily as needed for mild constipation.    [provider]  ferrous gluconate (FERGON) 324 MG tablet Take 1 tablet by mouth daily. 01/21/18   [provider]  gabapentin (NEURONTIN) 400 MG capsule Take 400 mg by mouth 4 (four) times daily.  12/29/17   [provider]  glimepiride (AMARYL) 4 MG tablet Take 4 mg by mouth daily. 03/09/18 03/09/19  [provider]  Insulin Glargine (LANTUS SOLOSTAR) 100 UNIT/ML Solostar Pen Inject 10 Units into the skin Nightly. 01/19/18 01/22/2019  [provider]  interferon beta-1a (AVONEX) 30 MCG injection Inject 30 mcg into the muscle once a week.    [provider]  Lactobacillus Acid-Pectin (EQL PROBIOTIC ACIDOPHILUS) CAPS Take 1 capsule by mouth daily.    [provider]  magnesium gluconate (MAGONATE) 500 MG tablet Take 500 mg by mouth daily.    [provider]  methadone (DOLOPHINE) 5 MG tablet Take 5 mg by mouth every 6 (six) hours.    [provider]  mometasone (NASONEX) 50 MCG/ACT nasal spray Place 2 sprays into the nose at bedtime.    [provider]  Multiple Vitamins-Minerals (MULTIVITAMIN PO) Take 2 each by mouth at bedtime.    [provider]  NARCAN 4 MG/0.1ML LIQD nasal spray kit Place 4 mg into the nose once. 10/30/17   [provider]  omeprazole (PRILOSEC) 20 MG capsule Take 20 mg by mouth daily.    [provider]  promethazine  (PHENERGAN) 12.5 MG tablet Take 12.5 mg by mouth every 6 (six) hours as needed for nausea or vomiting.    [provider]  Pyridoxine HCl (VITAMIN B-6 PO) Take 1 tablet by mouth at bedtime.    [provider]  silver sulfADIAZINE (SILVADENE) 1 % cream Apply 1 application topically 2 (two) times daily. 01/15/18   [provider]  triamcinolone cream (KENALOG) 0.1 % Apply 1 application topically 2 (two) times daily. 06/30/17 06/30/18  [provider]     Allergies Hydralazine hcl; Morphine and related; and Codeine  Family History  Problem Relation Age of Onset  . COPD Father     Social History Social History   Tobacco Use  . Smoking status: Never Smoker  . Smokeless tobacco: Never Used  Substance Use Topics  . Alcohol use: No  . Drug use: No    Review of Systems limited by altered mental status    ____________________________________________   PHYSICAL EXAM:  VITAL SIGNS: ED Triage Vitals  Enc Vitals Group     BP 04/11/18 0658 116/61     Pulse Rate 04/11/18 0658 85     Resp 04/11/18 0658 (!) 24     Temp 04/11/18 0658 100.2 F (37.9 C)     Temp Source 04/11/18 0658 Oral     SpO2 04/11/18 0658 92 %     Weight 04/11/18 0706 90.7 kg (200 lb)     Height 04/11/18 0706 1.676 m (_0 )     Head Circumference --      Peak Flow --      Pain Score --      Pain Loc --      Pain Edu? --      Excl. in [place]? --     Constitutional: Alert but confused Eyes: Conjunctivae are normal.  Head: Atraumatic. Nose: No congestion/rhinnorhea. Mouth/Throat: Mucous membranes are moist.   Neck:  Painless ROM Cardiovascular: Normal rate, irregular rhythm. Grossly normal heart sounds.  Good peripheral circulation. Respiratory: Tachypnea, no retractions.  Bibasilar rales Gastrointestinal: Soft and nontender. No distention.   Musculoskeletal: Left great toe, well-healed, no ulceration or evidence of cellulitis or infection.  Warm and well perfused Neurologic:  Confused is able to answer some simple questions no gross focal neurologic deficits are appreciated.  Skin:  Skin is warm, dry and intact. No rash noted.   ____________________________________________   LABS (all labs ordered are listed, but only abnormal results are displayed)  Labs Reviewed  COMPREHENSIVE METABOLIC PANEL - Abnormal; Notable for the following components:      Result Value   Glucose, Bld 157 (*)    Creatinine, Ser 1.58 (*)    Calcium 8.6 (*)    Albumin 3.0 (*)    GFR calc non Af Amer 30 (*)    GFR calc Af Amer 35 (*)    All other components within normal limits  CBC WITH DIFFERENTIAL/PLATELET - Abnormal; Notable for the following components:   WBC 12.6 (*)    RBC 3.22 (*)    Hemoglobin 10.3 (*)    HCT 32.0 (*)    RDW 15.6 (*)    Neutro Abs 10.8 (*)    All other components within normal limits  CULTURE, BLOOD (ROUTINE X 2)  CULTURE, BLOOD (ROUTINE X 2)  URINE CULTURE  LACTIC ACID, PLASMA  PROTIME-INR  URINALYSIS, COMPLETE (UACMP) WITH MICROSCOPIC  URINALYSIS, ROUTINE W REFLEX MICROSCOPIC   ____________________________________________  EKG  ED ECG REPORT I, Lavonia Drafts, the attending physician, personally viewed and interpreted this ECG.  Date: 04/11/2018  Rhythm: First-degree AV block QRS Axis: normal Intervals: normal ST/T Wave abnormalities: Nonspecific changes Narrative Interpretation: no evidence of acute ischemia  ____________________________________________  RADIOLOGY  Chest x-ray with left-sided infiltrate ____________________________________________   PROCEDURES  Procedure(s) performed: No  Procedures   Critical Care performed: yes  CRITICAL CARE Performed by: Lavonia Drafts   Total critical care time:69mnutes  Critical care time was exclusive of separately billable procedures and treating other patients.  Critical care was necessary to treat or prevent imminent or life-threatening deterioration.  Critical care was  time spent personally by me on the following activities: development of treatment plan with patient and/or surrogate as well as nursing, discussions with consultants, evaluation of patient's response to treatment, examination of patient, obtaining history from patient or surrogate, ordering and performing treatments and interventions, ordering and review of laboratory studies, ordering and review of radiographic studies, pulse oximetry and re-evaluation of patient's condition.  ____________________________________________   INITIAL IMPRESSION / ASSESSMENT AND PLAN / ED COURSE  Pertinent labs & imaging results that were available during my care of the patient were reviewed by me and considered in my medical decision making (see chart for details).  Patient presents with confusion, fever, tachypnea and oxygen saturations around 90 to 92%.  We have started nasal cannula oxygen.  Concern for severe sepsis, possible pneumonia.  COVID-19 high on the differential.  Urinary tract infection is also on the differential.  Code sepsis called  Chest x-ray demonstrates left-sided infiltrate, will cover with IV azithromycin, IV Rocephin, lactic is reassuring, normal saline 1 L ordered.  Discussed with hospitalist service for admission     Katera MAmalya Salmonswas evaluated in Emergency Department on 04/11/2018 for the symptoms described in the history of present illness. She was evaluated in the context of the global COVID-19 pandemic, which necessitated consideration that the patient might be at risk for infection with the SARS-CoV-2 virus that causes COVID-19. Institutional protocols and algorithms that pertain to the evaluation of patients at risk for COVID-19 are in a state of rapid change based on information released by regulatory bodies including the CDC and federal and state organizations. These policies and algorithms were followed during the patient's care in the ED.      ____________________________________________   FINAL CLINICAL IMPRESSION(S) / ED DIAGNOSES  Final diagnoses:  Community acquired pneumonia of left lower lobe of lung ([place]        Note:  This document was prepared using Dragon voice recognition software and may include unintentional dictation errors.   KLavonia Drafts MD 04/11/18 [phone]

## 2018-04-11 NOTE — ED Notes (Signed)
Dr. Katheren Shams at bedside. Stated would call pt daughter to talk to her.

## 2018-04-11 NOTE — ED Notes (Signed)
Ronal Sanks (husband) called this Char[employer] fundraiser for update. Informed him that pt is being admitted. He verbalized understanding.

## 2018-04-12 DIAGNOSIS — R509 Fever, unspecified: Secondary | ICD-10-CM

## 2018-04-12 DIAGNOSIS — Z89412 Acquired absence of left great toe: Secondary | ICD-10-CM

## 2018-04-12 DIAGNOSIS — B952 Enterococcus as the cause of diseases classified elsewhere: Secondary | ICD-10-CM

## 2018-04-12 DIAGNOSIS — G35 Multiple sclerosis: Secondary | ICD-10-CM

## 2018-04-12 DIAGNOSIS — L03115 Cellulitis of right lower limb: Secondary | ICD-10-CM

## 2018-04-12 LAB — COMPREHENSIVE METABOLIC PANEL
ALT: 15 U/L (ref 0–44)
AST: 25 U/L (ref 15–41)
Albumin: 2.5 g/dL — ABNORMAL LOW (ref 3.5–5.0)
Alkaline Phosphatase: 42 U/L (ref 38–126)
Anion gap: 6 (ref 5–15)
BUN: 25 mg/dL — ABNORMAL HIGH (ref 8–23)
CO2: 26 mmol/L (ref 22–32)
Calcium: 7.8 mg/dL — ABNORMAL LOW (ref 8.9–10.3)
Chloride: 107 mmol/L (ref 98–111)
Creatinine, Ser: 1.43 mg/dL — ABNORMAL HIGH (ref 0.44–1.00)
GFR calc Af Amer: 39 mL/min — ABNORMAL LOW (ref >60)
GFR calc non Af Amer: 34 mL/min — ABNORMAL LOW (ref >60)
Glucose, Bld: 48 mg/dL — ABNORMAL LOW (ref 70–99)
POTASSIUM: 4 mmol/L (ref 3.5–5.1)
Sodium: 139 mmol/L (ref 135–145)
TOTAL PROTEIN: 6.2 g/dL — AB (ref 6.5–8.1)
Total Bilirubin: 0.3 mg/dL (ref 0.3–1.2)

## 2018-04-12 LAB — RESPIRATORY PANEL BY PCR
Adenovirus: NOT DETECTED
Bordetella pertussis: NOT DETECTED
CORONAVIRUS 229E-RVPPCR: NOT DETECTED
Chlamydophila pneumoniae: NOT DETECTED
Coronavirus HKU1: NOT DETECTED
Coronavirus NL63: NOT DETECTED
Coronavirus OC43: NOT DETECTED
INFLUENZA B-RVPPCR: NOT DETECTED
Influenza A: NOT DETECTED
Metapneumovirus: NOT DETECTED
Mycoplasma pneumoniae: NOT DETECTED
PARAINFLUENZA VIRUS 2-RVPPCR: NOT DETECTED
Parainfluenza Virus 1: NOT DETECTED
Parainfluenza Virus 3: NOT DETECTED
Parainfluenza Virus 4: NOT DETECTED
Respiratory Syncytial Virus: NOT DETECTED
Rhinovirus / Enterovirus: NOT DETECTED

## 2018-04-12 LAB — URINE CULTURE

## 2018-04-12 LAB — CBC
HCT: 29.8 % — ABNORMAL LOW (ref 36.0–46.0)
Hemoglobin: 9.1 g/dL — ABNORMAL LOW (ref 12.0–15.0)
MCH: 31.3 pg (ref 26.0–34.0)
MCHC: 30.5 g/dL (ref 30.0–36.0)
MCV: 102.4 fL — ABNORMAL HIGH (ref 80.0–100.0)
NRBC: 0 % (ref 0.0–0.2)
Platelets: 170 10*3/uL (ref 150–400)
RBC: 2.91 MIL/uL — ABNORMAL LOW (ref 3.87–5.11)
RDW: 16.1 % — AB (ref 11.5–15.5)
WBC: 9.9 10*3/uL (ref 4.0–10.5)

## 2018-04-12 LAB — GLUCOSE, CAPILLARY
GLUCOSE-CAPILLARY: 27 mg/dL — AB (ref 70–99)
GLUCOSE-CAPILLARY: 61 mg/dL — AB (ref 70–99)
Glucose-Capillary: 98 mg/dL (ref 70–99)
Glucose-Capillary: 69 mg/dL — ABNORMAL LOW (ref 70–99)
Glucose-Capillary: 126 mg/dL — ABNORMAL HIGH (ref 70–99)
Glucose-Capillary: 32 mg/dL — CL (ref 70–99)
Glucose-Capillary: 47 mg/dL — ABNORMAL LOW (ref 70–99)
Glucose-Capillary: 53 mg/dL — ABNORMAL LOW (ref 70–99)
Glucose-Capillary: 129 mg/dL — ABNORMAL HIGH (ref 70–99)

## 2018-04-12 LAB — PHOSPHORUS: Phosphorus: 3.8 mg/dL (ref 2.5–4.6)

## 2018-04-12 LAB — MAGNESIUM: MAGNESIUM: 1.9 mg/dL (ref 1.7–2.4)

## 2018-04-12 LAB — PROCALCITONIN: Procalcitonin: 0.95 ng/mL

## 2018-04-12 MED ORDER — DEXTROSE 50 % IV SOLN
12.5000 g | Freq: Once | INTRAVENOUS | Status: AC
  Administered 2018-04-12: 12.5 g via INTRAVENOUS

## 2018-04-12 MED ORDER — DEXTROSE 5 % IV SOLN
INTRAVENOUS | Status: DC
  Administered 2018-04-12: 16:00:00 via INTRAVENOUS

## 2018-04-12 MED ORDER — ENOXAPARIN SODIUM 40 MG/0.4ML ~~LOC~~ SOLN
40.0000 mg | SUBCUTANEOUS | Status: DC
  Administered 2018-04-12 – 2018-04-15 (×4): 40 mg via SUBCUTANEOUS
  Filled 2018-04-12 (×4): qty 0.4

## 2018-04-12 MED ORDER — SODIUM CHLORIDE 0.9 % IV SOLN
INTRAVENOUS | Status: DC | PRN
  Administered 2018-04-12 – 2018-04-14 (×4): 250 mL via INTRAVENOUS

## 2018-04-12 MED ORDER — DEXTROSE 50 % IV SOLN
INTRAVENOUS | Status: AC
  Administered 2018-04-12: 16:00:00
  Filled 2018-04-12: qty 50

## 2018-04-12 MED ORDER — DEXTROSE 50 % IV SOLN
INTRAVENOUS | Status: AC
  Filled 2018-04-12: qty 50

## 2018-04-12 MED ORDER — DEXTROSE 50 % IV SOLN
INTRAVENOUS | Status: AC
  Administered 2018-04-12: 08:00:00
  Filled 2018-04-12: qty 50

## 2018-04-12 MED ORDER — SODIUM CHLORIDE 0.9 % IV BOLUS
500.0000 mL | Freq: Once | INTRAVENOUS | Status: AC
  Administered 2018-04-12: 500 mL via INTRAVENOUS

## 2018-04-12 MED ORDER — DIPHENHYDRAMINE HCL 50 MG PO CAPS
50.0000 mg | ORAL_CAPSULE | Freq: Every evening | ORAL | Status: DC | PRN
  Administered 2018-04-13 – 2018-04-14 (×3): 50 mg via ORAL
  Filled 2018-04-12 (×4): qty 1

## 2018-04-12 MED ORDER — DEXTROSE 10 % IV SOLN
INTRAVENOUS | Status: DC
  Administered 2018-04-12: 21:00:00 via INTRAVENOUS

## 2018-04-12 NOTE — Progress Notes (Addendum)
Patient's AM serum glucose was 48 and a CBG was not checked for comparison on the previous shift. This RN, upon assessment, found the patient's CBG to be 27. No viable IV access was patent, despite VAT assessment earlier this AM. This RN was able to emergently establish PIV access and administer IV Dextrose to the patient. The follow-up CBG was 98. Patient remained conscious throughout this event. No adverse effects.

## 2018-04-12 NOTE — Progress Notes (Signed)
  COVID-19 DISASTER DECLARATION:   FULL CONTACT PHYSICAL EXAMINATION WAS NOT POSSIBLE DUE TO TREATMENT OF COVID-19  AND CONSERVATION OF PERSONAL PROTECTIVE EQUIPMENT, LIMITED EXAM FINDINGS INCLUDE-severe cellulitis groin/RT lower leg    PATIENT for the symptoms described in the history of present illness. In the context of the global COVID-19 pandemic, which necessitated consideration that the patient might be at risk for infection with the SARS-CoV-2 virus that causes COVID-19.  Institutional protocols and algorithms that pertain to the evaluation of patients at risk for COVID-19 are in a state of rapid change based on information released by regulatory bodies including the CDC and federal and state organizations. These policies and algorithms were followed during the patient's care while in hospital     hypotension and bradycardia. PT BP 88/39 HR 45 (map 55) and 96/44 HR 44 (map 60). New order received for NS 500 ml IVF bolus x 2 Responded well to fluids  INFECTIOUS DISEASE -continue antibiotics as prescribed -follow up cultures -follow up ID consultation     Lucie Leather, M.D.  [facility]  Medical Director [facility] Medical Director [facility] Cardio-Pulmonary Department

## 2018-04-12 NOTE — Plan of Care (Signed)
  Problem: Education: Goal: Knowledge of General Education information will improve Description: Including pain rating scale, medication(s)/side effects and non-pharmacologic comfort measures Outcome: Progressing   Problem: Health Behavior/Discharge Planning: Goal: Ability to manage health-related needs will improve Outcome: Progressing   Problem: Clinical Measurements: Goal: Ability to maintain clinical measurements within normal limits will improve Outcome: Progressing Goal: Will remain free from infection Outcome: Progressing Goal: Diagnostic test results will improve Outcome: Progressing Goal: Respiratory complications will improve Outcome: Progressing Goal: Cardiovascular complication will be avoided Outcome: Progressing   Problem: Activity: Goal: Risk for activity intolerance will decrease Outcome: Progressing   Problem: Nutrition: Goal: Adequate nutrition will be maintained Outcome: Progressing   Problem: Coping: Goal: Level of anxiety will decrease Outcome: Progressing   Problem: Elimination: Goal: Will not experience complications related to bowel motility Outcome: Progressing Goal: Will not experience complications related to urinary retention Outcome: Progressing   Problem: Pain Managment: Goal: General experience of comfort will improve Outcome: Progressing   Problem: Safety: Goal: Ability to remain free from injury will improve Outcome: Progressing   Problem: Skin Integrity: Goal: Risk for impaired skin integrity will decrease Outcome: Progressing   Problem: Clinical Measurements: Goal: Ability to maintain a body temperature in the normal range will improve Outcome: Progressing   Problem: Respiratory: Goal: Ability to maintain adequate ventilation will improve Outcome: Progressing Goal: Ability to maintain a clear airway will improve Outcome: Progressing   

## 2018-04-12 NOTE — Progress Notes (Signed)
85 yo F with DM, Multiple Sclerosis, L great toe amputation 03-11-18 adm on 3-28 with mental status change and fever. She was found to have cellulitis in her RLE. Her Tmax is 103.2 She was felt to be low risk for COVID, was started on vanco/diflucan/azithro/ceftriaxone. Her bcx now show 2/2 entercoccus (non-vre).  She was changed to ampicillin, flucon.  wil repeat her BCx Will check TTE Will ask Dr Avon Gully to f/u tomorrow

## 2018-04-12 NOTE — Progress Notes (Signed)
Spoke with NP concerning low CBG's today. Verbal order received to switch IV fluids to D10 at 50 ml/hr if 2000 CBG is less than 70.

## 2018-04-12 NOTE — Progress Notes (Signed)
Patient's CBG remains labile, precipitously dropping. D5 infusion started. HR remains lower: 40s-60s. Temp: remains afebrile for me today but does report chills, often rigors while awake. Family updated that the patient remains critically ill. Will CTM for changes throughout the remainder of my shift.

## 2018-04-12 NOTE — Progress Notes (Signed)
Patient requesting benadryl for sleep. E-link notified, Dr. Violet Baldy entered order for benadryl.

## 2018-04-12 NOTE — Progress Notes (Signed)
NP notified of patients hypotension and bradycardia. Pt BP 88/39 HR 45 (map 55) and 96/44 HR 44 (map 60). New order received for NS 500 ml IVF bolus over an hour, if no improvement given another 500 NS IVF bolus over an hour and then reevaluate.

## 2018-04-13 ENCOUNTER — Inpatient Hospital Stay (HOSPITAL_COMMUNITY)
Admit: 2018-04-13 | Discharge: 2018-04-13 | Disposition: A | Discharge status: Home / Self Care | Payer: Medicare Other | Attending: Infectious Diseases | Admitting: Infectious Diseases

## 2018-04-13 DIAGNOSIS — I361 Nonrheumatic tricuspid (valve) insufficiency: Secondary | ICD-10-CM

## 2018-04-13 DIAGNOSIS — E119 Type 2 diabetes mellitus without complications: Secondary | ICD-10-CM

## 2018-04-13 DIAGNOSIS — D509 Iron deficiency anemia, unspecified: Secondary | ICD-10-CM

## 2018-04-13 DIAGNOSIS — Z96652 Presence of left artificial knee joint: Secondary | ICD-10-CM

## 2018-04-13 DIAGNOSIS — I10 Essential (primary) hypertension: Secondary | ICD-10-CM

## 2018-04-13 DIAGNOSIS — Z993 Dependence on wheelchair: Secondary | ICD-10-CM

## 2018-04-13 DIAGNOSIS — Z8739 Personal history of other diseases of the musculoskeletal system and connective tissue: Secondary | ICD-10-CM

## 2018-04-13 DIAGNOSIS — R7881 Bacteremia: Secondary | ICD-10-CM

## 2018-04-13 DIAGNOSIS — Z885 Allergy status to narcotic agent status: Secondary | ICD-10-CM

## 2018-04-13 DIAGNOSIS — Z888 Allergy status to other drugs, medicaments and biological substances status: Secondary | ICD-10-CM

## 2018-04-13 LAB — BASIC METABOLIC PANEL
Anion gap: 9 (ref 5–15)
BUN: 16 mg/dL (ref 8–23)
CHLORIDE: 103 mmol/L (ref 98–111)
CO2: 24 mmol/L (ref 22–32)
Calcium: 8.2 mg/dL — ABNORMAL LOW (ref 8.9–10.3)
Creatinine, Ser: 1.01 mg/dL — ABNORMAL HIGH (ref 0.44–1.00)
GFR calc Af Amer: >60 mL/min (ref >60)
GFR calc non Af Amer: 52 mL/min — ABNORMAL LOW (ref >60)
Glucose, Bld: 113 mg/dL — ABNORMAL HIGH (ref 70–99)
Potassium: 4.1 mmol/L (ref 3.5–5.1)
Sodium: 136 mmol/L (ref 135–145)

## 2018-04-13 LAB — CBC
HCT: 29.6 % — ABNORMAL LOW (ref 36.0–46.0)
Hemoglobin: 9.3 g/dL — ABNORMAL LOW (ref 12.0–15.0)
MCH: 31.4 pg (ref 26.0–34.0)
MCHC: 31.4 g/dL (ref 30.0–36.0)
MCV: 100 fL (ref 80.0–100.0)
Platelets: 178 10*3/uL (ref 150–400)
RBC: 2.96 MIL/uL — ABNORMAL LOW (ref 3.87–5.11)
RDW: 15.9 % — ABNORMAL HIGH (ref 11.5–15.5)
WBC: 8 10*3/uL (ref 4.0–10.5)
nRBC: 0 % (ref 0.0–0.2)

## 2018-04-13 LAB — GLUCOSE, CAPILLARY
Glucose-Capillary: 112 mg/dL — ABNORMAL HIGH (ref 70–99)
Glucose-Capillary: 118 mg/dL — ABNORMAL HIGH (ref 70–99)
Glucose-Capillary: 190 mg/dL — ABNORMAL HIGH (ref 70–99)
Glucose-Capillary: 106 mg/dL — ABNORMAL HIGH (ref 70–99)
Glucose-Capillary: 135 mg/dL — ABNORMAL HIGH (ref 70–99)

## 2018-04-13 LAB — RPR: RPR Ser Ql: NONREACTIVE

## 2018-04-13 LAB — HIV ANTIBODY (ROUTINE TESTING W REFLEX): HIV Screen 4th Generation wRfx: NONREACTIVE

## 2018-04-13 LAB — PROCALCITONIN: Procalcitonin: 0.41 ng/mL

## 2018-04-13 MED ORDER — GABAPENTIN 300 MG PO CAPS
300.0000 mg | ORAL_CAPSULE | Freq: Three times a day (TID) | ORAL | Status: DC
  Administered 2018-04-13 – 2018-04-16 (×9): 300 mg via ORAL
  Filled 2018-04-13 (×9): qty 1

## 2018-04-13 MED ORDER — OXYMETAZOLINE HCL 0.05 % NA SOLN
1.0000 | Freq: Two times a day (BID) | NASAL | Status: AC
  Administered 2018-04-13 – 2018-04-15 (×6): 1 via NASAL
  Filled 2018-04-13: qty 15

## 2018-04-13 MED ORDER — ALPRAZOLAM 0.5 MG PO TABS
0.5000 mg | ORAL_TABLET | Freq: Three times a day (TID) | ORAL | Status: DC | PRN
  Administered 2018-04-13: 0.5 mg via ORAL
  Filled 2018-04-13: qty 1

## 2018-04-13 MED ORDER — MAGNESIUM HYDROXIDE 400 MG/5ML PO SUSP
30.0000 mL | Freq: Once | ORAL | Status: DC
  Filled 2018-04-13: qty 30

## 2018-04-13 MED ORDER — INSULIN ASPART 100 UNIT/ML ~~LOC~~ SOLN
0.0000 [IU] | Freq: Three times a day (TID) | SUBCUTANEOUS | Status: DC
  Administered 2018-04-15: 2 [IU] via SUBCUTANEOUS
  Administered 2018-04-15 – 2018-04-16 (×2): 1 [IU] via SUBCUTANEOUS
  Filled 2018-04-13 (×4): qty 1

## 2018-04-13 MED ORDER — BENAZEPRIL HCL 20 MG PO TABS
20.0000 mg | ORAL_TABLET | Freq: Every day | ORAL | Status: DC
  Administered 2018-04-13 – 2018-04-15 (×3): 20 mg via ORAL
  Filled 2018-04-13 (×4): qty 1

## 2018-04-13 MED ORDER — POLYETHYLENE GLYCOL 3350 17 G PO PACK
17.0000 g | PACK | Freq: Every day | ORAL | Status: DC
  Administered 2018-04-15: 17 g via ORAL
  Filled 2018-04-13: qty 1

## 2018-04-13 MED ORDER — INSULIN ASPART 100 UNIT/ML ~~LOC~~ SOLN: 0.0000 [IU] | Freq: Every day | SUBCUTANEOUS | Status: DC

## 2018-04-13 NOTE — Consult Note (Addendum)
WOC Nurse wound consult note Consult requested for abd/breast skin folds and buttocks.  Pt is on isolation for r/o Covid-19 and bedside nurse described skin appearance and discussed plan of care, since she has recently performed a skin assessment.  Pt has reddish purple skin to bilat buttocks with "shaggy irregular appearance."  There are patchy areas of partial thickness skin loss.  Description and appearance are consistent with moisture associated skin damage and a condition refered to as "chronic skin damage" which occurs when patients spend large amts of time in a recliner and are incontinent.  Patient confirmed she did spend a large amt time in a recliner prior to admission when asked.  Abd/breast skin folds have description and appearance consistent with moisture associated skin damage and intertrigo.  Dressing procedure/placement/frequency: Pt is on a low air loss mattress to reduce pressure and barrier cream can be applied with each turning and cleaning episode to promote healing and repel moisture to buttocks.  Interdry silver-impregnated fabric to promote healing to abd wounds; this should remain in place for 5 days for optimal plan of care.  Instructions provided for staff nurse use.  Please re-consult if further assistance is needed.  Thank-you,  Cammie Mcgee MSN, RN, CWOCN, [place], CNS [phone]

## 2018-04-13 NOTE — Progress Notes (Signed)
*  PRELIMINARY RESULTS* Echocardiogram 2D Echocardiogram has been performed.  Stefanie Pham 04/13/2018, 12:16 PM

## 2018-04-13 NOTE — Progress Notes (Signed)
CRITICAL CARE NOTE        SUBJECTIVE FINDINGS & SIGNIFICANT EVENTS   Resting in bed.  Overnight had recurrent hypoglycemia s/p D10, had insomnia required benadryl.    PAST MEDICAL HISTORY   Past Medical History:  Diagnosis Date  . Diabetes mellitus without complication (HCC)   . Diverticulitis   . Hernia, inguinal   . Hypertension   . Multiple sclerosis (HCC)      SURGICAL HISTORY   Past Surgical History:  Procedure Laterality Date  . ABDOMINAL HYSTERECTOMY    . AMPUTATION TOE Left 03/11/2018   Procedure: AMPUTATION TOE LEFT GREAT TOE;  Surgeon: Linus Galas, DPM;  Location: [facility];  Service: Podiatry;  Laterality: Left;  . APPENDECTOMY    . CERVICAL SPINE SURGERY N/A   . CHOLECYSTECTOMY    . HERNIA REPAIR    . ORIF ANKLE FRACTURE Left   . REPLACEMENT TOTAL KNEE Left 2     FAMILY HISTORY   Family History  Problem Relation Age of Onset  . COPD Father      SOCIAL HISTORY   Social History   Tobacco Use  . Smoking status: Never Smoker  . Smokeless tobacco: Never Used  Substance Use Topics  . Alcohol use: No  . Drug use: No     MEDICATIONS   Current Medication:  Current Facility-Administered Medications:  .  0.9 %  sodium chloride infusion, , Intravenous, PRN, Tukov-Yual, Magdalene S, NP, Last Rate: 10 mL/hr at 04/13/18 0700 .  acetaminophen (TYLENOL) tablet 650 mg, 650 mg, Oral, Q4H PRN, Belia Heman, Kurian, MD .  ampicillin (OMNIPEN) 2 g in sodium chloride 0.9 % 100 mL IVPB, 2 g, Intravenous, Q6H, Will Bonnet, MD, Stopped at 04/13/18 9030 .  aspirin EC tablet 81 mg, 81 mg, Oral, QHS, Salary, Montell D, MD, 81 mg at 04/12/18 2033 .  dextrose 10 % infusion, , Intravenous, Continuous, Tukov-Yual, Magdalene S, NP, Stopped at 04/13/18 0612 .  diphenhydrAMINE (BENADRYL) capsule 50 mg,  50 mg, Oral, QHS PRN, Darl Pikes, MD, 50 mg at 04/13/18 0008 .  docusate sodium (COLACE) capsule 100-200 mg, 100-200 mg, Oral, BID PRN, Salary, Montell D, MD, 200 mg at 04/13/18 0102 .  enoxaparin (LOVENOX) injection 40 mg, 40 mg, Subcutaneous, Q24H, Kasa, Kurian, MD, 40 mg at 04/12/18 2033 .  ferrous gluconate (FERGON) tablet 324 mg, 324 mg, Oral, Daily, Salary, Montell D, MD, 324 mg at 04/12/18 0955 .  fluconazole (DIFLUCAN) IVPB 200 mg, 200 mg, Intravenous, Q24H, Erin Fulling, MD, Stopped at 04/12/18 2353 .  fluticasone (FLONASE) 50 MCG/ACT nasal spray 1 spray, 1 spray, Each Nare, Daily, Salary, Montell D, MD, 1 spray at 04/12/18 1009 .  magnesium gluconate (MAGONATE) tablet 500 mg, 500 mg, Oral, Daily, Salary, Montell D, MD, 500 mg at 04/12/18 0955 .  magnesium hydroxide (MILK OF MAGNESIA) suspension 30 mL, 30 mL, Oral, Once, Tukov-Yual, Magdalene S, NP .  MEDLINE mouth rinse, 15 mL, Mouth Rinse, BID, Kasa, Kurian, MD .  methadone (DOLOPHINE) tablet 5 mg, 5 mg, Oral, Q6H, Salary, Montell D, MD, 5 mg at 04/13/18 0008 .  multivitamin with minerals tablet, , Oral, QHS, Salary, Montell D, MD, 1 tablet at 04/12/18 2033 .  pantoprazole (PROTONIX) EC tablet 40 mg, 40 mg, Oral, Daily, Salary, Montell D, MD, 40 mg at 04/12/18 0955 .  polyethylene glycol (MIRALAX / GLYCOLAX) packet 17 g, 17 g, Oral, Daily, Tukov-Yual, Magdalene S, NP .  promethazine (PHENERGAN) tablet 12.5 mg, 12.5 mg, Oral, Q6H  PRN, Salary, Evelena AsaMontell D, MD .  saccharomyces boulardii (FLORASTOR) capsule 250 mg, 250 mg, Oral, Daily, Salary, Montell D, MD, 250 mg at 04/12/18 0954 .  silver sulfADIAZINE (SILVADENE) 1 % cream 1 application, 1 application, Topical, BID, Salary, Montell D, MD, 1 application at 04/12/18 2057 .  triamcinolone cream (KENALOG) 0.1 % 1 application, 1 application, Topical, BID, Salary, Montell D, MD, 1 application at 04/12/18 2057 .  vitamin C (ASCORBIC ACID) tablet 250 mg, 250 mg, Oral, Daily, Salary, Montell  D, MD, 250 mg at 04/12/18 0956    ALLERGIES   Hydralazine hcl; Morphine and related; and Codeine    REVIEW OF SYSTEMS   10 sys ROS neg except as per subjective findings  PHYSICAL EXAMINATION   Vitals:   04/13/18 0500 04/13/18 0600  BP: (!) 140/56 (!) 153/58  Pulse: (!) 52 (!) 54  Resp: 14 15  Temp:    SpO2: 96% 93%    GENERAL:Mild distress due to acutely ill state HEAD: Normocephalic, atraumatic.  EYES: Pupils equal, round, reactive to light.  No scleral icterus.  MOUTH: Moist mucosal membrane. NECK:  No JVD.  PULMONARY:  Non tachypneic CARDIOVASCULAR: SR RRR   GASTROINTESTINAL: non-distended. MUSCULOSKELETAL:  RLE cellulitis NEUROLOGIC: Mild distress due to acute illness SKIN:intact  COVID-19 DISASTER DECLARATION:   FULL CONTACT PHYSICAL EXAMINATION WAS NOT POSSIBLE DUE TO TREATMENT OF COVID-19  AND CONSERVATION OF PERSONAL PROTECTIVE EQUIPMENT, LIMITED EXAM FINDINGS INCLUDE-    Patient assessed or the symptoms described in the history of present illness.  In the context of the Global COVID-19 pandemic, which necessitated consideration that the patient might be at risk for infection with the SARS-CoV-2 virus that causes COVID-19, Institutional protocols and algorithms that pertain to the evaluation of patients at risk for COVID-19 are in a state of rapid change based on information released by regulatory bodies including the CDC and federal and state organizations. These policies and algorithms were followed during the patient's care while in hospital.    LABS AND IMAGING     -I personally reviewed most recent blood work, imaging and microbiology - significant findings today are improving AKI stage I, hypoglycemia, anemia, negative RVP, + enterococcus species bacteremia  LAB RESULTS: Recent Labs  Lab 04/11/18 0730 04/12/18 0425 04/13/18 0432  NA 135 139 136  K 5.1 4.0 4.1  CL 103 107 103  CO2 25 26 24   BUN 21 25* 16  CREATININE 1.58* 1.43* 1.01*   GLUCOSE 157* 48* 113*   Recent Labs  Lab 04/11/18 0730 04/12/18 0425 04/13/18 0432  HGB 10.3* 9.1* 9.3*  HCT 32.0* 29.8* 29.6*  WBC 12.6* 9.9 8.0  PLT 208 170 178     IMAGING RESULTS: No results found.    ASSESSMENT AND PLAN    -Multidisciplinary rounds held today  CARDIAC FAILURE- Circulatory shock - distributive due to sepsis - now resolved ICU monitoring  Renal Failure-most likely due to ATN- improved GFR today -follow chem 7 -follow UO -continue Foley Catheter-assess need daily   NEUROLOGY -initially with AMS and confusion  - improved now Alert awake oriented    Septic shock - enterococcus bacteremia and RLE cellulitis -follow up cultures -emperic ABX- Ampicillin 2g q6h - day 2 today - follow ID and PharD recs -Diflucan 200 qdaily - day 3- ? Will ask ID if this is still necessary  -Silvadene 1% cream - day 3 -? Will have wound care evaluate     ID -continue IV antimicrobials as above -follow up cultures  GI/Nutrition GI PROPHYLAXIS as indicated - will dc protonix  DIET-->TF's as tolerated Constipation protocol as indicated   ENDO - ICU hypoglycemic protocol -check FSBS per protocol   ELECTROLYTES -follow labs as needed -replace as needed -pharmacy consultation   DVT/GI PRX ordered -SCDs  TRANSFUSIONS AS NEEDED MONITOR FSBS ASSESS the need for LABS as needed   Critical care provider statement:    Critical care time (minutes):  36   Critical care time was exclusive of:  Separately billable procedures and treating other patients   Critical care was necessary to treat or prevent imminent or life-threatening deterioration of the following conditions:  Sepsis bacteremia, Right lower extermity cellulitis, hypoglycemia, multiple comorbid conditions   Critical care was time spent personally by me on the following activities:  Development of treatment plan with patient or surrogate, discussions with consultants, evaluation of patient's  response to treatment, examination of patient, obtaining history from patient or surrogate, ordering and performing treatments and interventions, ordering and review of laboratory studies and re-evaluation of patient's condition.  I assumed direction of critical care for this patient from another provider in my specialty: no    This document was prepared using Dragon voice recognition software and may include unintentional dictation errors.    Vida Rigger, M.D.  Division of Pulmonary & Critical Care Medicine  [facility]

## 2018-04-13 NOTE — Consult Note (Addendum)
NAME: Stefanie Pham  DOB: May 10, 1933  MRN: [id number]  Date/Time: 04/13/2018 4:09 PM  REQUESTING PROVIDER:Aleskerov Subjective:  REASON FOR CONSULT: enterococcus bacteremia ?pt is apoor historian as poor memory. Chart reviewed Stefanie Pham is a 85 y.o. with a history of DM, MS, HTN, LEFt TKA Osteo left great toe and underwent partial amputation of the toe on 03/11/18. Culture was normal skin flora, and pathology showed proximal bone margin to be free of osteomyelitis. She was discharged on PO augmentin for 1 week on 03/13/18 after a 2 day stay in the hospital. She was brought in by EMS with fever and altered mental status. CXR questioned left lower lobe infiltrate.The rt leg was found to have erythema, swelling and thought to be cellulitis .Was started on IV azithromycin, ceftriaxone, and as blood cultures came positive for enterococcus the antibiotics were changed to ampicillin  Past Medical History:  Diagnosis Date  . Diabetes mellitus without complication (HCC)   . Diverticulitis   . Hernia, inguinal   . Hypertension   . Multiple sclerosis (HCC)     Past Surgical History:  Procedure Laterality Date  . ABDOMINAL HYSTERECTOMY    . AMPUTATION TOE Left 03/11/2018   Procedure: AMPUTATION TOE LEFT GREAT TOE;  Surgeon: Linus Galas, DPM;  Location: [facility] ORS;  Service: Podiatry;  Laterality: Left;  . APPENDECTOMY    . CERVICAL SPINE SURGERY N/A   . CHOLECYSTECTOMY    . HERNIA REPAIR    . ORIF ANKLE FRACTURE Left   . REPLACEMENT TOTAL KNEE Left 2    Social History   Socioeconomic History  . Marital status: Married    Spouse name: Not on file  . Number of children: Not on file  . Years of education: Not on file  . Highest education level: Not on file  Occupational History  . Not on file  Social Needs  . Financial resource strain: Not on file  . Food insecurity:    Worry: Not on file    Inability: Not on file  . Transportation needs:    Medical: Not on file     Non-medical: Not on file  Tobacco Use  . Smoking status: Never Smoker  . Smokeless tobacco: Never Used  Substance and Sexual Activity  . Alcohol use: No  . Drug use: No  . Sexual activity: Not on file  Lifestyle  . Physical activity:    Days per week: Not on file    Minutes per session: Not on file  . Stress: Not on file  Relationships  . Social connections:    Talks on phone: Not on file    Gets together: Not on file    Attends religious service: Not on file    Active member of club or organization: Not on file    Attends meetings of clubs or organizations: Not on file    Relationship status: Not on file  . Intimate partner violence:    Fear of current or ex partner: Not on file    Emotionally abused: Not on file    Physically abused: Not on file    Forced sexual activity: Not on file  Other Topics Concern  . Not on file  Social History Narrative  . Not on file    Family History  Problem Relation Age of Onset  . COPD Father    Allergies  Allergen Reactions  . Hydralazine Hcl Other (See Comments)    Severe hyponatremia  . Morphine And Related Shortness Of Breath  .  Codeine Nausea And Vomiting  ? Current Facility-Administered Medications  Medication Dose Route Frequency Provider Last Rate Last Dose  . 0.9 %  sodium chloride infusion   Intravenous PRN Tukov-Yual, Magdalene S, NP 10 mL/hr at 04/13/18 0700    . acetaminophen (TYLENOL) tablet 650 mg  650 mg Oral Q4H PRN Erin Fulling, MD      . ampicillin (OMNIPEN) 2 g in sodium chloride 0.9 % 100 mL IVPB  2 g Intravenous Q6H Avaiya, Hitesh, MD 300 mL/hr at 04/13/18 1200 2 g at 04/13/18 1200  . aspirin EC tablet 81 mg  81 mg Oral QHS Angelina Ok D, MD   81 mg at 04/12/18 2033  . diphenhydrAMINE (BENADRYL) capsule 50 mg  50 mg Oral QHS PRN Darl Pikes, MD   50 mg at 04/13/18 0008  . docusate sodium (COLACE) capsule 100-200 mg  100-200 mg Oral BID PRN Angelina Ok D, MD   200 mg at 04/13/18 0102  . enoxaparin  (LOVENOX) injection 40 mg  40 mg Subcutaneous Q24H Erin Fulling, MD   40 mg at 04/12/18 2033  . ferrous gluconate (FERGON) tablet 324 mg  324 mg Oral Daily Salary, Montell D, MD   324 mg at 04/13/18 0848  . fluticasone (FLONASE) 50 MCG/ACT nasal spray 1 spray  1 spray Each Nare Daily Salary, Montell D, MD   1 spray at 04/13/18 [phone]  . gabapentin (NEURONTIN) capsule 300 mg  300 mg Oral TID Bertram Savin, RPH   300 mg at 04/13/18 1428  . insulin aspart (novoLOG) injection 0-5 Units  0-5 Units Subcutaneous QHS Aleskerov, Fuad, MD      . insulin aspart (novoLOG) injection 0-9 Units  0-9 Units Subcutaneous TID WC Aleskerov, Fuad, MD      . magnesium gluconate (MAGONATE) tablet 500 mg  500 mg Oral Daily Salary, Montell D, MD   500 mg at 04/13/18 0847  . magnesium hydroxide (MILK OF MAGNESIA) suspension 30 mL  30 mL Oral Once Tukov-Yual, Magdalene S, NP      . MEDLINE mouth rinse  15 mL Mouth Rinse BID Erin Fulling, MD      . methadone (DOLOPHINE) tablet 5 mg  5 mg Oral Q6H Salary, Montell D, MD   5 mg at 04/13/18 0008  . multivitamin with minerals tablet   Oral QHS Angelina Ok D, MD   1 tablet at 04/12/18 2033  . oxymetazoline (AFRIN) 0.05 % nasal spray 1 spray  1 spray Each Nare BID Vida Rigger, MD   1 spray at 04/13/18 1100  . polyethylene glycol (MIRALAX / GLYCOLAX) packet 17 g  17 g Oral Daily Tukov-Yual, Magdalene S, NP      . promethazine (PHENERGAN) tablet 12.5 mg  12.5 mg Oral Q6H PRN Salary, Montell D, MD      . saccharomyces boulardii (FLORASTOR) capsule 250 mg  250 mg Oral Daily Salary, Montell D, MD   250 mg at 04/13/18 0848  . silver sulfADIAZINE (SILVADENE) 1 % cream 1 application  1 application Topical BID Salary, Evelena Asa, MD   1 application at 04/12/18 2057  . triamcinolone cream (KENALOG) 0.1 % 1 application  1 application Topical BID Salary, Evelena Asa, MD   1 application at 04/12/18 2057  . vitamin C (ASCORBIC ACID) tablet 250 mg  250 mg Oral Daily Salary, Montell D, MD    250 mg at 04/13/18 0847     Abtx:  Anti-infectives (From admission, onward)   Start  Dose/Rate Route Frequency Ordered Stop   04/12/18 0800  azithromycin (ZITHROMAX) 500 mg in sodium chloride 0.9 % 250 mL IVPB  Status:  Discontinued     500 mg 250 mL/hr over 60 Minutes Intravenous Every 24 hours 04/11/18 1231 04/11/18 2302   04/12/18 0800  cefTRIAXone (ROCEPHIN) 1 g in sodium chloride 0.9 % 100 mL IVPB  Status:  Discontinued     1 g 200 mL/hr over 30 Minutes Intravenous Every 24 hours 04/11/18 1231 04/11/18 2302   04/12/18 0000  ampicillin (OMNIPEN) 2 g in sodium chloride 0.9 % 100 mL IVPB     2 g 300 mL/hr over 20 Minutes Intravenous Every 6 hours 04/11/18 2302     04/11/18 2000  fluconazole (DIFLUCAN) IVPB 200 mg  Status:  Discontinued     200 mg 100 mL/hr over 60 Minutes Intravenous Every 24 hours 04/11/18 1639 04/13/18 1332   04/11/18 1130  cefTRIAXone (ROCEPHIN) 1 g in sodium chloride 0.9 % 100 mL IVPB  Status:  Discontinued     1 g 200 mL/hr over 30 Minutes Intravenous Every 24 hours 04/11/18 1126 04/11/18 1231   04/11/18 1130  azithromycin (ZITHROMAX) 500 mg in sodium chloride 0.9 % 250 mL IVPB  Status:  Discontinued     500 mg 250 mL/hr over 60 Minutes Intravenous Every 24 hours 04/11/18 1126 04/11/18 1146   04/11/18 0815  cefTRIAXone (ROCEPHIN) 2 g in sodium chloride 0.9 % 100 mL IVPB  Status:  Discontinued     2 g 200 mL/hr over 30 Minutes Intravenous Every 24 hours 04/11/18 0803 04/11/18 1146   04/11/18 0815  azithromycin (ZITHROMAX) 500 mg in sodium chloride 0.9 % 250 mL IVPB  Status:  Discontinued     500 mg 250 mL/hr over 60 Minutes Intravenous Every 24 hours 04/11/18 0803 04/11/18 1231      REVIEW OF SYSTEMS:  Not reliable Says rt leg hurt but says No to everything else Objective:  VITALS:  BP (!) 175/70   Pulse (!) 46   Temp 98.3 F (36.8 C) (Oral)   Resp 12   Ht 5\' 2"  (1.575 m)   Wt 97.1 kg   SpO2 (!) 88%   BMI 39.15 kg/m  PHYSICAL EXAM:  General:  Alert, cooperative, no distress, appears stated age. confused Head: Normocephalic, without obvious abnormality, atraumatic. Eyes: Conjunctivae clear, anicteric sclerae. Pupils are equal ENT Nares normal. No drainage or sinus tenderness. Lips, mucosa, and tongue normal. No Thrush Neck: Supple, symmetrical, no adenopathy, thyroid: non tender no carotid bruit and no JVD. Lungs: b/l air entry. Heart: s1s2. Abdomen: Soft, lower abdomen is firm Foley Extremities: rt leg there is erythema which is extending from the medical aspect of the lower leg to the inner thigh. Tender Left great toe- partial amputation site healed well- no infection  Skin: No rashes  Lymph: Cervical, supraclavicular normal. Neurologic: did not examine in detail Pertinent Labs Lab Results CBC    Component Value Date/Time   WBC 8.0 04/13/2018 0432   RBC 2.96 (L) 04/13/2018 0432   HGB 9.3 (L) 04/13/2018 0432   HCT 29.6 (L) 04/13/2018 0432   PLT 178 04/13/2018 0432   MCV 100.0 04/13/2018 0432   MCH 31.4 04/13/2018 0432   MCHC 31.4 04/13/2018 0432   RDW 15.9 (H) 04/13/2018 0432   LYMPHSABS 1.0 04/11/2018 0730   MONOABS 0.7 04/11/2018 0730   EOSABS 0.0 04/11/2018 0730   BASOSABS 0.0 04/11/2018 0730    CMP Latest Ref Rng & Units  04/13/2018 04/12/2018 04/11/2018  Glucose 70 - 99 mg/dL 161(W) 96(E) 454(U)  BUN 8 - 23 mg/dL 16 98(J) 21  Creatinine 0.44 - 1.00 mg/dL 1.91(Y) 7.82(N) 5.62(Z)  Sodium 135 - 145 mmol/L 136 139 135  Potassium 3.5 - 5.1 mmol/L 4.1 4.0 5.1  Chloride 98 - 111 mmol/L 103 107 103  CO2 22 - 32 mmol/L Calcium 8.9 - 10.3 mg/dL 8.2(L) 7.8(L) 8.6(L)  Total Protein 6.5 - 8.1 g/dL - 6.2(L) 7.5  Total Bilirubin 0.3 - 1.2 mg/dL - 0.3 0.4  Alkaline Phos 38 - 126 U/L - 42 52  AST 15 - 41 U/L - 25 32  ALT 0 - 44 U/L - 15 18      Microbiology: Recent Results (from the past 240 hour(s))  Culture, blood (Routine x 2)     Status: None (Preliminary result)   Collection Time: 04/11/18  7:30  AM  Result Value Ref Range Status   Specimen Description BLOOD R HAND  Final   Special Requests   Final    BOTTLES DRAWN AEROBIC AND ANAEROBIC Blood Culture results may not be optimal due to an excessive volume of blood received in culture bottles   Culture   Final    NO GROWTH 2 DAYS Performed at [facility], [address] [place], [place] [address]    Report Status PENDING  Incomplete  Culture, blood (Routine x 2)     Status: Abnormal (Preliminary result)   Collection Time: 04/11/18  7:32 AM  Result Value Ref Range Status   Specimen Description   Final    BLOOD L HAND Performed at [facility], [address], [place], [place] [address]    Special Requests   Final    BOTTLES DRAWN AEROBIC AND ANAEROBIC Blood Culture results may not be optimal due to an excessive volume of blood received in culture bottles Performed at [facility] [address] [place], [place] [address]    Culture  Setup Time   Final    GRAM POSITIVE COCCI IN BOTH AEROBIC AND ANAEROBIC BOTTLES CRITICAL RESULT CALLED TO, READ BACK BY AND VERIFIED WITH: DAVID BESANTA 04/11/2018 AT 2250 BY H S    Culture (A)  Final    ENTEROCOCCUS FAECALIS SUSCEPTIBILITIES TO FOLLOW Performed at [facility], [address], [place], [place] [address]    Report Status PENDING  Incomplete  Blood Culture ID Panel (Reflexed)     Status: Abnormal   Collection Time: 04/11/18  7:32 AM  Result Value Ref Range Status   Enterococcus species DETECTED (A) NOT DETECTED Final    Comment: CRITICAL RESULT CALLED TO, READ BACK BY AND VERIFIED WITH: DAVID BESANTI 3./28/2020 AT 2250 BY H SOEWARDIMAN    Vancomycin resistance NOT DETECTED NOT DETECTED Final   Listeria monocytogenes NOT DETECTED NOT DETECTED Final   Staphylococcus species NOT DETECTED NOT DETECTED Final   Staphylococcus aureus (BCID) NOT DETECTED NOT DETECTED Final   Streptococcus species NOT DETECTED NOT DETECTED Final   Streptococcus  agalactiae NOT DETECTED NOT DETECTED Final   Streptococcus pneumoniae NOT DETECTED NOT DETECTED Final   Streptococcus pyogenes NOT DETECTED NOT DETECTED Final   Acinetobacter baumannii NOT DETECTED NOT DETECTED Final   Enterobacteriaceae species NOT DETECTED NOT DETECTED Final   Enterobacter cloacae complex NOT DETECTED NOT DETECTED Final   Escherichia coli NOT DETECTED NOT DETECTED Final   Klebsiella oxytoca NOT DETECTED NOT DETECTED Final   Klebsiella pneumoniae NOT DETECTED NOT  DETECTED Final   Proteus species NOT DETECTED NOT DETECTED Final   Serratia marcescens NOT DETECTED NOT DETECTED Final   Haemophilus influenzae NOT DETECTED NOT DETECTED Final   Neisseria meningitidis NOT DETECTED NOT DETECTED Final   Pseudomonas aeruginosa NOT DETECTED NOT DETECTED Final   Candida albicans NOT DETECTED NOT DETECTED Final   Candida glabrata NOT DETECTED NOT DETECTED Final   Candida krusei NOT DETECTED NOT DETECTED Final   Candida parapsilosis NOT DETECTED NOT DETECTED Final   Candida tropicalis NOT DETECTED NOT DETECTED Final    Comment: Performed at [facility], [address] [place], [place] [address]  Urine culture     Status: None   Collection Time: 04/11/18  1:05 PM  Result Value Ref Range Status   Specimen Description   Final    URINE, RANDOM Performed at [facility], [address] [place], [place] [address]    Special Requests   Final    NONE Performed at [facility], [address] [place], [place] [address]    Culture   Final    Multiple bacterial morphotypes present, none predominant. Suggest appropriate recollection if clinically indicated.   Report Status 04/12/2018 FINAL  Final  Respiratory Panel by PCR     Status: None   Collection Time: 04/11/18  4:46 PM  Result Value Ref Range Status   Adenovirus NOT DETECTED NOT DETECTED Final   Coronavirus 229E NOT DETECTED NOT DETECTED Final    Comment: (NOTE) The Coronavirus on the Respiratory  Panel, DOES NOT test for the novel  Coronavirus (2019 nCoV)    Coronavirus HKU1 NOT DETECTED NOT DETECTED Final   Coronavirus NL63 NOT DETECTED NOT DETECTED Final   Coronavirus OC43 NOT DETECTED NOT DETECTED Final   Metapneumovirus NOT DETECTED NOT DETECTED Final   Rhinovirus / Enterovirus NOT DETECTED NOT DETECTED Final   Influenza A NOT DETECTED NOT DETECTED Final   Influenza B NOT DETECTED NOT DETECTED Final   Parainfluenza Virus 1 NOT DETECTED NOT DETECTED Final   Parainfluenza Virus 2 NOT DETECTED NOT DETECTED Final   Parainfluenza Virus 3 NOT DETECTED NOT DETECTED Final   Parainfluenza Virus 4 NOT DETECTED NOT DETECTED Final   Respiratory Syncytial Virus NOT DETECTED NOT DETECTED Final   Bordetella pertussis NOT DETECTED NOT DETECTED Final   Chlamydophila pneumoniae NOT DETECTED NOT DETECTED Final   Mycoplasma pneumoniae NOT DETECTED NOT DETECTED Final    Comment: Performed at [facility], [address] [place], [place] [address]  MRSA PCR Screening     Status: None   Collection Time: 04/11/18  4:46 PM  Result Value Ref Range Status   MRSA by PCR NEGATIVE NEGATIVE Final    Comment:        The GeneXpert MRSA Assay (FDA approved for NASAL specimens only), is one component of a comprehensive MRSA colonization surveillance program. It is not intended to diagnose MRSA infection nor to guide or monitor treatment for MRSA infections. Performed at [facility], [address] [place], [place] [address]   Culture, blood (Routine X 2) w Reflex to ID Panel     Status: None (Preliminary result)   Collection Time: 04/12/18  2:29 PM  Result Value Ref Range Status   Specimen Description BLOOD LAC  Final   Special Requests   Final    BOTTLES DRAWN AEROBIC AND ANAEROBIC Blood Culture adequate volume   Culture   Final    NO GROWTH < 24 HOURS Performed at  [facility], [address] [place], [place] [address]    Report Status PENDING  Incomplete     IMAGING RESULTS: Mild left basilar subsegmental atelectasis or infiltrate I have personally reviewed the films ? Impression/Recommendation ?85 yr female presenting with fever and altered mental status  Rt leg cellulitis-on ampicillin for enterococcus. Enterococcus bacteremia- 1 of 2 sites  unusual for enterococcus to cause cellulitis and not sure of the source for enterococcus as it is not a skin pathogen Currently on Ampicillin Will need 2 d echo May be TEE  She is a low risk for COVID -  AKI has resolved  Macrocytic anemia ? Multiple sclerosis wheel chair bound  H/o Left TKA  ? ___________________________________________________ Discussed with the care team Will talk to her husband

## 2018-04-14 LAB — CULTURE, BLOOD (ROUTINE X 2)

## 2018-04-14 LAB — BASIC METABOLIC PANEL
Anion gap: 8 (ref 5–15)
BUN: 12 mg/dL (ref 8–23)
CO2: 25 mmol/L (ref 22–32)
Calcium: 8.6 mg/dL — ABNORMAL LOW (ref 8.9–10.3)
Chloride: 106 mmol/L (ref 98–111)
Creatinine, Ser: 0.88 mg/dL (ref 0.44–1.00)
GFR calc Af Amer: >60 mL/min (ref >60)
GFR calc non Af Amer: >60 mL/min (ref >60)
Glucose, Bld: 98 mg/dL (ref 70–99)
Potassium: 3.8 mmol/L (ref 3.5–5.1)
Sodium: 139 mmol/L (ref 135–145)

## 2018-04-14 LAB — CBC
HCT: 30.4 % — ABNORMAL LOW (ref 36.0–46.0)
HEMOGLOBIN: 9.6 g/dL — AB (ref 12.0–15.0)
MCH: 31.4 pg (ref 26.0–34.0)
MCHC: 31.6 g/dL (ref 30.0–36.0)
MCV: 99.3 fL (ref 80.0–100.0)
Platelets: 181 10*3/uL (ref 150–400)
RBC: 3.06 MIL/uL — ABNORMAL LOW (ref 3.87–5.11)
RDW: 15.7 % — ABNORMAL HIGH (ref 11.5–15.5)
WBC: 4.8 10*3/uL (ref 4.0–10.5)
nRBC: 0 % (ref 0.0–0.2)

## 2018-04-14 LAB — GLUCOSE, CAPILLARY
GLUCOSE-CAPILLARY: 107 mg/dL — AB (ref 70–99)
Glucose-Capillary: 110 mg/dL — ABNORMAL HIGH (ref 70–99)
Glucose-Capillary: 114 mg/dL — ABNORMAL HIGH (ref 70–99)
Glucose-Capillary: 86 mg/dL (ref 70–99)

## 2018-04-14 LAB — LEGIONELLA PNEUMOPHILA SEROGP 1 UR AG: L. PNEUMOPHILA SEROGP 1 UR AG: NEGATIVE

## 2018-04-14 MED ORDER — SODIUM CHLORIDE 0.9 % IV SOLN
2.0000 g | INTRAVENOUS | Status: DC
  Administered 2018-04-14 – 2018-04-16 (×14): 2 g via INTRAVENOUS
  Filled 2018-04-14: qty 2
  Filled 2018-04-14 (×2): qty 2000
  Filled 2018-04-14 (×2): qty 2
  Filled 2018-04-14: qty 2000
  Filled 2018-04-14: qty 2
  Filled 2018-04-14 (×2): qty 2000
  Filled 2018-04-14 (×3): qty 2
  Filled 2018-04-14 (×2): qty 2000
  Filled 2018-04-14: qty 2
  Filled 2018-04-14 (×2): qty 2000
  Filled 2018-04-14 (×2): qty 2

## 2018-04-14 MED ORDER — TRIAMCINOLONE ACETONIDE 0.1 % EX CREA
1.0000 "application " | TOPICAL_CREAM | Freq: Two times a day (BID) | CUTANEOUS | Status: DC | PRN
  Filled 2018-04-14: qty 15

## 2018-04-14 MED ORDER — SILVER SULFADIAZINE 1 % EX CREA
1.0000 "application " | TOPICAL_CREAM | Freq: Two times a day (BID) | CUTANEOUS | Status: DC | PRN
  Filled 2018-04-14: qty 85

## 2018-04-14 NOTE — Plan of Care (Signed)
Pt on antibiotic, RA ; cellulitis improving. Will continue to monitor pt closely.

## 2018-04-14 NOTE — Progress Notes (Signed)
Pt transferred from ICU. Pt is alert and oriented x 3. No complains of pain or SOB.

## 2018-04-14 NOTE — TOC Initial Note (Signed)
Transition of Care [facility]) - Initial/Assessment Note    Patient Details  Name: Stefanie Pham MRN: [id number] Date of Birth: 10/05/1933  [facility] CM/SW Contact:    Chapman Fitch, RN Phone Number: 04/14/2018, 2:31 PM  Clinical Narrative:                  Patient admitted from home with PNA. Patient made floor status 3/31.  Per chart review patient lives at home with husband, and daughter lives locally. PCP Wendie Simmer.  [facility] [place].  Previous admission patient received BSC and RW prior to discharge.     Per ID patient will need 2d echo and potentially TEE due to enterococcus bacteremia.   PT eval pending, however can not be evaluated till COVID 19 tests are resulted negative.    Patient Goals and CMS Choice        Expected Discharge Plan and Services                                    Prior Living Arrangements/Services                       Activities of Daily Living      Permission Sought/Granted                  Emotional Assessment              Admission diagnosis:  Sepsis (HCC) [A41.9] Community acquired pneumonia of left lower lobe of lung (HCC) [J18.1] CAP (community acquired pneumonia) [J18.9] Patient Active Problem List   Diagnosis Date Noted  . CAP (community acquired pneumonia) 04/11/2018  . Osteomyelitis (HCC) 03/11/2018  . Hyponatremia 07/01/2014   PCP:  Verner Mould, MD Pharmacy:   [phone] [place] [place] - [address] [place] [address] Phone: [phone] Fax: [phone]     Social Determinants of Health (SDOH) Interventions    Readmission Risk Interventions No flowsheet data found.

## 2018-04-14 NOTE — Progress Notes (Signed)
Home meds found in patient's room. Meds sent to pharmacy  Stephannie Peters, RN

## 2018-04-14 NOTE — Progress Notes (Addendum)
PT Cancellation Note  Patient Details Name: Stefanie Pham MRN: [id number] DOB: 02-21-1933   Cancelled Treatment:    Reason Eval/Treat Not Completed: Medical issues which prohibited therapy  Per current guildelines, patient contraindicated for therapy services until COVID-19 testing results are received and patient cleared (results negative) for involvement of rehab teams.  Will continue to follow and initiate as appropriate.    Rhiann Boucher H. Manson Passey, PT, DPT, NCS 04/14/18, 10:44 AM [phone]

## 2018-04-14 NOTE — Progress Notes (Signed)
CRITICAL CARE NOTE        SUBJECTIVE FINDINGS & SIGNIFICANT EVENTS   Patient clinically improved.  Waiting on transesophageal echo due to coronavirus rule out.  PAST MEDICAL HISTORY   Past Medical History:  Diagnosis Date  . Diabetes mellitus without complication (HCC)   . Diverticulitis   . Hernia, inguinal   . Hypertension   . Multiple sclerosis (HCC)      SURGICAL HISTORY   Past Surgical History:  Procedure Laterality Date  . ABDOMINAL HYSTERECTOMY    . AMPUTATION TOE Left 03/11/2018   Procedure: AMPUTATION TOE LEFT GREAT TOE;  Surgeon: Linus Galas, DPM;  Location: [facility] ORS;  Service: Podiatry;  Laterality: Left;  . APPENDECTOMY    . CERVICAL SPINE SURGERY N/A   . CHOLECYSTECTOMY    . HERNIA REPAIR    . ORIF ANKLE FRACTURE Left   . REPLACEMENT TOTAL KNEE Left 2     FAMILY HISTORY   Family History  Problem Relation Age of Onset  . COPD Father      SOCIAL HISTORY   Social History   Tobacco Use  . Smoking status: Never Smoker  . Smokeless tobacco: Never Used  Substance Use Topics  . Alcohol use: No  . Drug use: No     MEDICATIONS   Current Medication:  Current Facility-Administered Medications:  .  0.9 %  sodium chloride infusion, , Intravenous, PRN, Tukov-Yual, Magdalene S, NP, Last Rate: 10 mL/hr at 04/13/18 0700 .  acetaminophen (TYLENOL) tablet 650 mg, 650 mg, Oral, Q4H PRN, Erin Fulling, MD .  ALPRAZolam Prudy Feeler) tablet 0.5 mg, 0.5 mg, Oral, TID PRN, Vida Rigger, MD, 0.5 mg at 04/13/18 1827 .  ampicillin (OMNIPEN) 2 g in sodium chloride 0.9 % 100 mL IVPB, 2 g, Intravenous, Q4H, Bertram Savin, RPH .  aspirin EC tablet 81 mg, 81 mg, Oral, QHS, Salary, Montell D, MD, 81 mg at 04/13/18 2019 .  benazepril (LOTENSIN) tablet 20 mg, 20 mg, Oral, Daily, Karis Emig,  MD, 20 mg at 04/13/18 1826 .  diphenhydrAMINE (BENADRYL) capsule 50 mg, 50 mg, Oral, QHS PRN, Darl Pikes, MD, 50 mg at 04/13/18 2203 .  docusate sodium (COLACE) capsule 100-200 mg, 100-200 mg, Oral, BID PRN, Salary, Montell D, MD, 200 mg at 04/13/18 0102 .  enoxaparin (LOVENOX) injection 40 mg, 40 mg, Subcutaneous, Q24H, Kasa, Kurian, MD, 40 mg at 04/13/18 2020 .  ferrous gluconate (FERGON) tablet 324 mg, 324 mg, Oral, Daily, Salary, Montell D, MD, 324 mg at 04/13/18 0848 .  fluticasone (FLONASE) 50 MCG/ACT nasal spray 1 spray, 1 spray, Each Nare, Daily, Salary, Montell D, MD, 1 spray at 04/13/18 [phone] .  gabapentin (NEURONTIN) capsule 300 mg, 300 mg, Oral, TID, Bertram Savin, RPH, 300 mg at 04/13/18 2203 .  insulin aspart (novoLOG) injection 0-5 Units, 0-5 Units, Subcutaneous, QHS, Bronco Mcgrory, MD .  insulin aspart (novoLOG) injection 0-9 Units, 0-9 Units, Subcutaneous, TID WC, Jacia Sickman, MD .  magnesium gluconate (MAGONATE) tablet 500 mg, 500 mg, Oral, Daily, Salary, Montell D, MD, 500 mg at 04/13/18 0847 .  magnesium hydroxide (MILK OF MAGNESIA) suspension 30 mL, 30 mL, Oral, Once, Tukov-Yual, Magdalene S, NP .  MEDLINE mouth rinse, 15 mL, Mouth Rinse, BID, Kasa, Kurian, MD, 15 mL at 04/13/18 2201 .  methadone (DOLOPHINE) tablet 5 mg, 5 mg, Oral, Q6H, Salary, Montell D, MD, 5 mg at 04/14/18 0626 .  multivitamin with minerals tablet, , Oral, QHS, Salary, Montell D, MD, 1  tablet at 04/13/18 2019 .  oxymetazoline (AFRIN) 0.05 % nasal spray 1 spray, 1 spray, Each Nare, BID, Vida RiggerAleskerov, Marvin Maenza, MD, 1 spray at 04/13/18 2201 .  polyethylene glycol (MIRALAX / GLYCOLAX) packet 17 g, 17 g, Oral, Daily, Tukov-Yual, Magdalene S, NP .  promethazine (PHENERGAN) tablet 12.5 mg, 12.5 mg, Oral, Q6H PRN, Salary, Montell D, MD .  saccharomyces boulardii (FLORASTOR) capsule 250 mg, 250 mg, Oral, Daily, Salary, Montell D, MD, 250 mg at 04/13/18 0848 .  silver sulfADIAZINE (SILVADENE) 1 % cream 1  application, 1 application, Topical, BID PRN, Bertram SavinSimpson, Michael L, RPH .  triamcinolone cream (KENALOG) 0.1 % 1 application, 1 application, Topical, BID PRN, Bertram SavinSimpson, Michael L, RPH .  vitamin C (ASCORBIC ACID) tablet 250 mg, 250 mg, Oral, Daily, Salary, Montell D, MD, 250 mg at 04/13/18 0847    ALLERGIES   Hydralazine hcl; Morphine and related; and Codeine    REVIEW OF SYSTEMS   10 system ROS not conducted due to coronavirus precautions  PHYSICAL EXAMINATION   Vitals:   04/14/18 0600 04/14/18 0800  BP: (!) 144/53 (!) 136/46  Pulse: (!) 38 (!) 42  Resp: 13 17  Temp:  (!) 97.3 F (36.3 C)  SpO2: 98% 93%    GENERAL: No apparent distress HEAD: Normocephalic, atraumatic.  EYES: Pupils equal, round, reactive to light.  No scleral icterus.  MOUTH: Moist mucosal membrane. NECK: Supple. No thyromegaly. No nodules. No JVD.  PULMONARY: Tachypneic CARDIOVASCULAR: Regular sinus rhythm per telemetry GASTROINTESTINAL: Grossly non-distended abdomen MUSCULOSKELETAL: No swelling, clubbing, or edema.  NEUROLOGIC: Mild distress due to acute illness SKIN:intact  COVID-19 DISASTER DECLARATION:   FULL CONTACT PHYSICAL EXAMINATION WAS NOT POSSIBLE DUE TO TREATMENT OF COVID-19  AND CONSERVATION OF PERSONAL PROTECTIVE EQUIPMENT, LIMITED EXAM FINDINGS INCLUDE-    Patient assessed or the symptoms described in the history of present illness.  In the context of the Global COVID-19 pandemic, which necessitated consideration that the patient might be at risk for infection with the SARS-CoV-2 virus that causes COVID-19, Institutional protocols and algorithms that pertain to the evaluation of patients at risk for COVID-19 are in a state of rapid change based on information released by regulatory bodies including the CDC and federal and state organizations. These policies and algorithms were followed during the patient's care while in hospital.    LABS AND IMAGING     LAB RESULTS: Recent Labs   Lab 04/12/18 0425 04/13/18 0432 04/14/18 0556  NA 139 136 139  K 4.0 4.1 3.8  CL 107 103 106  CO2 26 24 25   BUN 25* 16 12  CREATININE 1.43* 1.01* 0.88  GLUCOSE 48* 113* 98   Recent Labs  Lab 04/12/18 0425 04/13/18 0432 04/14/18 0556  HGB 9.1* 9.3* 9.6*  HCT 29.8* 29.6* 30.4*  WBC 9.9 8.0 4.8  PLT 170 178 181     IMAGING RESULTS: No results found.  TTE 04/12/29 FINDINGS  Left Ventricle: The left ventricle has normal systolic function, with an ejection fraction of 55-60%. The cavity size was normal. There is mild concentric left ventricular hypertrophy.   Right Ventricle: The right ventricle has normal systolic function. The cavity was normal. There is no increase in right ventricular wall thickness. Left Atrium: Left atrial size was normal in size. Right Atrium: Right atrial size was normal in size. Right atrial pressure is estimated at 10 mmHg. Interatrial Septum: No atrial level shunt detected by color flow Doppler. Pericardium: There is no evidence of pericardial effusion. Mitral Valve: Mitral  valve regurgitation was not assessed by color flow Doppler. Tricuspid Valve: The tricuspid valve was grossly normal. Tricuspid valve regurgitation is mild by color flow Doppler. Aortic Valve: The aortic valve is grossly normal Mild calcification of the aortic valve. Aortic valve regurgitation is trivial by color flow Doppler. Pulmonic Valve: Pulmonic valve regurgitation was not assessed by color flow Doppler. No pulmonic valve vegetation visualized. Venous: The inferior vena cava was not well visualized.    ASSESSMENT AND PLAN    -Multidisciplinary rounds held today   Septic shock - enterococcus bacteremia and RLE cellulitis -follow up cultures -emperic ABX- Ampicillin 2g q6h - day 2 today - follow ID and PharD recs -Diflucan 200 qdaily - day 3-was prescribed for vaginal yeast infection-we will stop antifungals now -Silvadene 1% cream - day 3 -we will stop Silvadene cream    Right lower extremity cellulitis- on ampicillin     CARDIAC FAILURE- Circulatory shock - distributive due to sepsis - now resolved -Status post transthoracic echo as above without valvular vegetations and overall reassuring findings ICU monitoring  Renal Failure-most likely due to ATN-improved to reference range today -continue Foley Catheter-assess need daily   NEUROLOGY -initially with AMS and confusion  - improved now Alert awake oriented     ID -continue IV antimicrobials as above -follow up cultures   GI/Nutrition GI PROPHYLAXIS as indicated - will dc protonix  DIET-->TF's as tolerated Constipation protocol as indicated   ENDO - ICU hypoglycemic protocol -check FSBS per protocol   ELECTROLYTES -follow labs as needed -replace as needed -pharmacy consultation   DVT/GI PRX ordered -SCDs  TRANSFUSIONS AS NEEDED MONITOR FSBS ASSESS the need for LABS as needed   Critical care provider statement:   Critical care time (minutes): 33  Critical care time was exclusive of: Separately billable procedures and treating other patients  Critical care was necessary to treat or prevent imminent or life-threatening deterioration of the following conditions: Sepsis bacteremia, Right lower extermity cellulitis, hypoglycemia, multiple comorbid conditions  Critical care was time spent personally by me on the following activities: Development of treatment plan with patient or surrogate, discussions with consultants, evaluation of patient's response to treatment, examination of patient, obtaining history from patient or surrogate, ordering and performing treatments and interventions, ordering and review of laboratory studies and re-evaluation of patient's condition.  I assumed direction of critical care for this patient from another provider in my specialty: no   This document was prepared using Dragon voice recognition software and may include  unintentional dictation errors.    Vida Rigger, M.D.  Division of Pulmonary & Critical Care Medicine  [facility]

## 2018-04-15 LAB — GLUCOSE, CAPILLARY
Glucose-Capillary: 109 mg/dL — ABNORMAL HIGH (ref 70–99)
Glucose-Capillary: 142 mg/dL — ABNORMAL HIGH (ref 70–99)
Glucose-Capillary: 138 mg/dL — ABNORMAL HIGH (ref 70–99)
Glucose-Capillary: 118 mg/dL — ABNORMAL HIGH (ref 70–99)
Glucose-Capillary: 147 mg/dL — ABNORMAL HIGH (ref 70–99)

## 2018-04-15 LAB — NOVEL CORONAVIRUS, NAA (HOSP ORDER, SEND-OUT TO REF LAB; TAT 18-24 HRS)

## 2018-04-15 LAB — NOVEL CORONAVIRUS, NAA (HOSPITAL ORDER, SEND-OUT TO REF LAB): SARS-COV-2, NAA: NOT DETECTED

## 2018-04-15 MED ORDER — ONDANSETRON HCL 4 MG/2ML IJ SOLN
INTRAMUSCULAR | Status: AC
  Filled 2018-04-15: qty 2

## 2018-04-15 MED ORDER — AMLODIPINE BESYLATE 5 MG PO TABS
5.0000 mg | ORAL_TABLET | Freq: Once | ORAL | Status: AC
  Administered 2018-04-15: 5 mg via ORAL
  Filled 2018-04-15: qty 1

## 2018-04-15 MED ORDER — AMLODIPINE BESYLATE 5 MG PO TABS
5.0000 mg | ORAL_TABLET | Freq: Every day | ORAL | Status: DC
  Administered 2018-04-15: 22:00:00 5 mg via ORAL
  Filled 2018-04-15: qty 1

## 2018-04-15 MED ORDER — CLINDAMYCIN PHOSPHATE 600 MG/50ML IV SOLN
600.0000 mg | Freq: Three times a day (TID) | INTRAVENOUS | Status: DC
  Administered 2018-04-15 – 2018-04-16 (×4): 600 mg via INTRAVENOUS
  Filled 2018-04-15 (×7): qty 50

## 2018-04-15 NOTE — Clinical Social Work Note (Signed)
Important Message  Patient Details  Name: Stefanie Pham MRN: [id number] Date of Birth: [birth year]   Medicare Important Message Given:   Yes Patient on isolation, CSW spoke with patient, and she expressed verbal understanding.  Bedside nurse to give to patient next time she goes in the room.   Darleene Cleaver, LCSW 04/15/2018, 1:22 PM

## 2018-04-15 NOTE — TOC Initial Note (Addendum)
Transition of Care [facility]) - Initial/Assessment Note    Patient Details  Name: Stefanie Pham MRN: [id number] Date of Birth: [birth year]  Transition of Care [facility]) CM/SW Contact:    Darleene Cleaver, LCSW Phone Number: 04/15/2018, 6:25 PM  Clinical Narrative:  Patient is a 85 year old female who is alert and oriented x3.  Patient lives with husband and is mostly bed bound.  Patient's husband and daughter provide support for her and help her with transfers.  Patient's husband picks up medications for her, and they also use [facility] services to help her get to her appointment.  Patient's husband states they have had home health in the past, and they had a good experience with one home health agency and not a good experience with another one.  Patient's husband stated that they have a bedside commode, and a wheelchair, they do not have a hoyer lift.  Patient's husband stated they have transportation arranged for tomorrow to pick patient up if she is medically ready for discharge.  Patient is hopeful that she can discharge tomorrow.  CSW attempted to contact patient's daughter Scheryl Darter [phone], left a message on voice mail awaiting for call back from patient's daughter.                Expected Discharge Plan: Home w Home Health Services Barriers to Discharge: Continued Medical Work up   Patient Goals and CMS Choice Patient states their goals for this hospitalization and ongoing recovery are:: Patient's husband states he would like the patient to return back home with home health. CMS Medicare.gov Compare Post Acute Care list provided to:: Patient Represenative (must comment)(Patient is in isolation room, gave options via phone.) Choice offered to / list presented to : Spouse  Expected Discharge Plan and Services Expected Discharge Plan: Home w Home Health Services In-house Referral: Clinical Social Work Discharge Planning Services: CM Consult Post Acute Care  Choice: Home Health, Durable Medical Equipment Living arrangements for the past 2 months: Single Family Home                          Prior Living Arrangements/Services Living arrangements for the past 2 months: Single Family Home Lives with:: Spouse Patient language and need for interpreter reviewed:: No Do you feel safe going back to the place where you live?: Yes      Need for Family Participation in Patient Care: Yes (Comment) Care giver support system in place?: Yes (comment) Current home services: DME Criminal Activity/Legal Involvement Pertinent to Current Situation/Hospitalization: No - Comment as needed  Activities of Daily Living Home Assistive Devices/Equipment: Dan Humphreys (specify type) ADL Screening (condition at time of admission) Patient's cognitive ability adequate to safely complete daily activities?: No Is the patient deaf or have difficulty hearing?: No Does the patient have difficulty seeing, even when wearing glasses/contacts?: No Does the patient have difficulty concentrating, remembering, or making decisions?: No Patient able to express need for assistance with ADLs?: Yes Does the patient have difficulty dressing or bathing?: Yes Independently performs ADLs?: No Communication: Independent Dressing (OT): Needs assistance Is this a change from baseline?: Pre-admission baseline Grooming: Independent Feeding: Independent Bathing: Needs assistance Is this a change from baseline?: Pre-admission baseline Toileting: Needs assistance Is this a change from baseline?: Pre-admission baseline Does the patient have difficulty walking or climbing stairs?: Yes Weakness of Legs: Both Weakness of Arms/Hands: None  Permission Sought/Granted Permission sought to share information with : Family Supports Permission  granted to share information with : Yes, Verbal Permission Granted  Share Information with NAME: Jaela, [place] [phone] or Scheryl Darter Daughter  [phone]  [phone]   Permission granted to share info w AGENCY: Home Health agencies        Emotional Assessment Appearance:: Appears stated age   Affect (typically observed): Pleasant, Appropriate, Calm, Stable Orientation: : Oriented to Self, Oriented to Place, Oriented to Situation Alcohol / Substance Use: Not Applicable Psych Involvement: No (comment)  Admission diagnosis:  Sepsis (HCC) [A41.9] Community acquired pneumonia of left lower lobe of lung (HCC) [J18.1] CAP (community acquired pneumonia) [J18.9] Patient Active Problem List   Diagnosis Date Noted  . CAP (community acquired pneumonia) 04/11/2018  . Osteomyelitis (HCC) 03/11/2018  . Hyponatremia 07/01/2014   PCP:  Verner Mould, MD Pharmacy:   [facility] [phone] - [place], [place] - [address] [place] [address] Phone: [phone] Fax: [phone]     Social Determinants of Health (SDOH) Interventions    Readmission Risk Interventions No flowsheet data found.

## 2018-04-15 NOTE — Progress Notes (Signed)
[employer] - [place] at [facility]   PATIENT NAME: Stefanie Pham    MR#:  [id number]  DATE OF BIRTH:  July 08, 1933  SUBJECTIVE:  CHIEF COMPLAINT:   Chief Complaint  Patient presents with  . Fever   Patient without complaint, no events overnight REVIEW OF SYSTEMS:  CONSTITUTIONAL: No fever, fatigue or weakness.  EYES: No blurred or double vision.  EARS, NOSE, AND THROAT: No tinnitus or ear pain.  RESPIRATORY: No cough, shortness of breath, wheezing or hemoptysis.  CARDIOVASCULAR: No chest pain, orthopnea, edema.  GASTROINTESTINAL: No nausea, vomiting, diarrhea or abdominal pain.  GENITOURINARY: No dysuria, hematuria.  ENDOCRINE: No polyuria, nocturia,  HEMATOLOGY: No anemia, easy bruising or bleeding SKIN: No rash or lesion. MUSCULOSKELETAL: No joint pain or arthritis.   NEUROLOGIC: No tingling, numbness, weakness.  PSYCHIATRY: No anxiety or depression.   ROS  DRUG ALLERGIES:   Allergies  Allergen Reactions  . Hydralazine Hcl Other (See Comments)    Severe hyponatremia  . Morphine And Related Shortness Of Breath  . Codeine Nausea And Vomiting    VITALS:  Blood pressure (!) 172/62, pulse 69, temperature 98.6 F (37 C), resp. rate 18, height 5\' 2"  (1.575 m), weight 90.2 kg, SpO2 98 %.  PHYSICAL EXAMINATION:  GENERAL:  85 y.o.-year-old patient lying in the bed with no acute distress.  EYES: Pupils equal, round, reactive to light and accommodation. No scleral icterus. Extraocular muscles intact.  HEENT: Head atraumatic, normocephalic. Oropharynx and nasopharynx clear.  NECK:  Supple, no jugular venous distention. No thyroid enlargement, no tenderness.  LUNGS: Normal breath sounds bilaterally, no wheezing, rales,rhonchi or crepitation. No use of accessory muscles of respiration.  CARDIOVASCULAR: S1, S2 normal. No murmurs, rubs, or gallops.  ABDOMEN: Soft, nontender, nondistended. Bowel sounds present. No organomegaly or mass.  EXTREMITIES: No pedal edema,  cyanosis, or clubbing.  NEUROLOGIC: Cranial nerves II through XII are intact. Muscle strength 5/5 in all extremities. Sensation intact. Gait not checked.  PSYCHIATRIC: The patient is alert and oriented x 3.  SKIN: Right lower extremity cellulitis extending in to just above the ankle  Physical Exam LABORATORY PANEL:   CBC Recent Labs  Lab 04/14/18 0556  WBC 4.8  HGB 9.6*  HCT 30.4*  PLT 181   ------------------------------------------------------------------------------------------------------------------  Chemistries  Recent Labs  Lab 04/12/18 0425  04/14/18 0556  NA 139   < > 139  K 4.0   < > 3.8  CL 107   < > 106  CO2 26   < > 25  GLUCOSE 48*   < > 98  BUN 25*   < > 12  CREATININE 1.43*   < > 0.88  CALCIUM 7.8*   < > 8.6*  MG 1.9  --   --   AST 25  --   --   ALT 15  --   --   ALKPHOS 42  --   --   BILITOT 0.3  --   --    < > = values in this interval not displayed.   ------------------------------------------------------------------------------------------------------------------  Cardiac Enzymes No results for input(s): TROPONINI in the last 168 hours. ------------------------------------------------------------------------------------------------------------------  RADIOLOGY:  No results found.  ASSESSMENT AND PLAN:  *Acute left community-acquired pneumonia Resolved Treated on our pneumonia protocol, treated initially with empiric Rocephin/azithromycin-antibiotics changed to ampicillin given blood cultures noted for Enterococcus faecalis, Dr. Meryl Dare disease did see patient while in house, and patient is doing well  *Acute sepsis, present on admission Secondary to Enterococcus faecalis bacteremia, pneumonia,  right lower extremity cellulitis Treated on our sepsis protocol, antibiotics per above  *Acute toxic metabolic encephalopathy Most likely secondary to above Resolved  *Acute kidney injury most likely secondary to  dehydration Secondary to sepsis, poor p.o. intake Resolved with IV fluids for rehydration   *Acute right lower extremity cellulitis Resolving Antibiotics per above  *Acute concern for coronavirus infection Clinical suspicion is low Continue droplet/contact precautions Follow-up on outstanding COVID-19 testing  *Chronic diabetes mellitus type 2 Stable Continue home regiment, sliding scale insulin Accu-Cheks per routine  *Chronic hypertension Continue home regiment  *Chronic multiple sclerosis with functional quadriplegia Stable Continue increased nursing care PRN, aspiration/fall/skin care precautions while in house  *Chronic sacral decubitus wound stage II Continue local wound care, wound care nurse consulted  Disposition Home in 1 to 2 days barring any complications, physical therapy to evaluate/treat  All the records are reviewed and case discussed with Care Management/Social Workerr. Management plans discussed with the patient, family and they are in agreement.  CODE STATUS: full  TOTAL TIME TAKING CARE OF THIS PATIENT: 35 minutes.     POSSIBLE D/C IN 1-2 DAYS, DEPENDING ON CLINICAL CONDITION.   Evelena Asa Salary M.D on 04/15/2018   Between 7am to 6pm - Pager - [phone]  After 6pm go to [email or web address] - Social research officer, government  [facility]  Office  [phone]  CC: Primary care physician; Verner Mould, MD  Note: This dictation was prepared with Dragon dictation along with smaller phrase technology. Any transcriptional errors that result from this process are unintentional.

## 2018-04-15 NOTE — Progress Notes (Signed)
   Date of Admission:  04/11/2018    ID:  Cellulitis rt leg Enterococcus bacteremia 1 of 2 sites Sars COV neg  Subjective: Doing better Says rt leg is still swollen and red Medications:  . amLODipine  5 mg Oral QHS  . aspirin EC  81 mg Oral QHS  . benazepril  20 mg Oral Daily  . enoxaparin (LOVENOX) injection  40 mg Subcutaneous Q24H  . ferrous gluconate  324 mg Oral Daily  . fluticasone  1 spray Each Nare Daily  . gabapentin  300 mg Oral TID  . insulin aspart  0-5 Units Subcutaneous QHS  . insulin aspart  0-9 Units Subcutaneous TID WC  . magnesium gluconate  500 mg Oral Daily  . mouth rinse  15 mL Mouth Rinse BID  . methadone  5 mg Oral Q6H  . multivitamin with minerals   Oral QHS  . ondansetron      . oxymetazoline  1 spray Each Nare BID  . polyethylene glycol  17 g Oral Daily  . saccharomyces boulardii  250 mg Oral Daily  . vitamin C  250 mg Oral Daily    Objective: Vital signs in last 24 hours: Temp:  [98.3 F (36.8 C)-98.7 F (37.1 C)] 98.6 F (37 C) (04/01 0759) Pulse Rate:  [52-69] 69 (04/01 0759) Resp:  [16-18] 18 (04/01 0759) BP: (142-172)/(58-75) 172/62 (04/01 0759) SpO2:  [96 %-98 %] 98 % (04/01 0759) Weight:  [90.2 kg] 90.2 kg (04/01 0500)  PHYSICAL EXAM:  General: Alert, cooperative, no distress, appears stated age. Oriented in place, person, year, month Head: Normocephalic, without obvious abnormality, atraumatic. Eyes: Conjunctivae clear, anicteric sclerae. Pupils are equal ENT Nares normal. No drainage or sinus tenderness. Lips, mucosa, and tongue normal. No Thrush Neck: Supple, symmetrical, no adenopathy, thyroid: non tender no carotid bruit and no JVD. Back: No CVA tenderness. Lungs: b/l air entry Heart: s1s2Abdomen: Soft, non-tender,not distended. Bowel sounds normal. No masses Extremities: rt leg cellulitis and lymphangitis -erythema streaking up the leg medial aspect from leg to thigh  Skin: has scaling, sun changes Left 3rd, 4th finger raw  areas Lymph: Cervical, supraclavicular normal. Neurologic: Grossly non-focal  Lab Results Recent Labs    04/13/18 0432 04/14/18 0556  WBC 8.0 4.8  HGB 9.3* 9.6*  HCT 29.6* 30.4*  NA 136 139  K 4.1 3.8  CL 103 106  CO2 24 25  BUN 16 12  CREATININE 1.01* 0.88  Microbiology: 3/28 BC 1 site of 2 enterococcus 3/29 BC neg  Studies/Results: No results found.   Assessment/Plan:  Encephalopathy due to sepsis- has resolved  Enterococcus bacteremia- unclear source- could be urine but culture was polymicrobial and lab did not see any enterococcus  Will get post void bladder scan to make sure there is no residue/incomplete emptying as she has multiple sclerosis and may have neurogenic bladder.   need TEE to r/o endocarditis as 2 d echo valves okay.    Cellulitis rt leg- will add clindamycin to the ampicillin  AKI- resolved  SARS COV 2 negative: DC droplet/contact  MS-wheel chair bound  Discussed the management with patient and her nurse

## 2018-04-15 NOTE — Progress Notes (Signed)
Low bed ordered via secretary, safety mats placed on floor. IV wrapped with Kerlix, bed alarm on. Will continue to monitor.

## 2018-04-16 ENCOUNTER — Encounter
Admission: EM | Disposition: A | Discharge status: Home Health | Payer: Self-pay | Source: Home / Self Care | Attending: Family Medicine

## 2018-04-16 ENCOUNTER — Inpatient Hospital Stay: Payer: Self-pay

## 2018-04-16 LAB — GLUCOSE, CAPILLARY
Glucose-Capillary: 126 mg/dL — ABNORMAL HIGH (ref 70–99)
Glucose-Capillary: 121 mg/dL — ABNORMAL HIGH (ref 70–99)
Glucose-Capillary: 104 mg/dL — ABNORMAL HIGH (ref 70–99)

## 2018-04-16 LAB — CULTURE, BLOOD (ROUTINE X 2): Culture: NO GROWTH

## 2018-04-16 SURGERY — ECHOCARDIOGRAM, TRANSESOPHAGEAL
Anesthesia: Moderate Sedation

## 2018-04-16 MED ORDER — AMPICILLIN IV (FOR PTA / DISCHARGE USE ONLY): 12.0000 g | INTRAVENOUS | 0 refills | Status: AC

## 2018-04-16 MED ORDER — SODIUM CHLORIDE 0.9 % IV SOLN: INTRAVENOUS | Status: DC

## 2018-04-16 MED ORDER — FLUCONAZOLE 100 MG PO TABS
200.0000 mg | ORAL_TABLET | Freq: Once | ORAL | Status: AC
  Administered 2018-04-16: 12:00:00 200 mg via ORAL
  Filled 2018-04-16: qty 2

## 2018-04-16 MED ORDER — SODIUM CHLORIDE 0.9% FLUSH: 10.0000 mL | INTRAVENOUS | Status: DC | PRN

## 2018-04-16 MED ORDER — SODIUM CHLORIDE 0.9% FLUSH: 10.0000 mL | Freq: Two times a day (BID) | INTRAVENOUS | Status: DC

## 2018-04-16 NOTE — TOC Progression Note (Signed)
Transition of Care [facility]) - Progression Note    Patient Details  Name: Stefanie Pham MRN: [id number] Date of Birth: Apr 24, 1933  Transition of Care [facility]) CM/SW Contact  Sherren Kerns, RN Phone Number: 04/16/2018, 11:50 AM  Clinical Narrative:   PICC team from [facility] plans on placing PICC around 3-4 today.      Expected Discharge Plan: Home w Home Health Services Barriers to Discharge: Continued Medical Work up  Expected Discharge Plan and Services Expected Discharge Plan: Home w Home Health Services In-house Referral: Clinical Social Work Discharge Planning Services: CM Consult Post Acute Care Choice: Home Health, Durable Medical Equipment Living arrangements for the past 2 months: Single Family Home Expected Discharge Date: 04/16/18                   HH Arranged: IV Antibiotics([facility])     Social Determinants of Health (SDOH) Interventions    Readmission Risk Interventions No flowsheet data found.

## 2018-04-16 NOTE — Discharge Summary (Signed)
[place] [facility] [place] NAME: Stefanie Pham    MR#:  [id number]  DATE OF BIRTH:  December 04, 1933  DATE OF ADMISSION:  04/11/2018 ADMITTING PHYSICIAN: Gorden Harms, MD  DATE OF DISCHARGE: No discharge date for patient encounter.  PRIMARY CARE PHYSICIAN: Clarisse Gouge, MD    ADMISSION DIAGNOSIS:  Sepsis ([place]) [A41.9] Community acquired pneumonia of left lower lobe of lung ([place]) [J18.1] CAP (community acquired pneumonia) [J18.9]  DISCHARGE DIAGNOSIS:  Active Problems:   CAP (community acquired pneumonia)   SECONDARY DIAGNOSIS:   Past Medical History:  Diagnosis Date  . Diabetes mellitus without complication ([place])   . Diverticulitis   . Hernia, inguinal   . Hypertension   . Multiple sclerosis [facility])     HOSPITAL COURSE:  *Acute left community-acquired pneumonia Resolved Treated on our pneumonia protocol, treated initially with empiric Rocephin/azithromycin-antibiotics changed to ampicillin given blood cultures noted for Enterococcus faecalis, Dr. Christ Kick disease did see patient while in house, and patient did well  *Acute sepsis, present on admission resolved Secondary to Enterococcus faecalis bacteremia, pneumonia, right lower extremity cellulitis Treated on our sepsis protocol, antibiotics per above for 9 more days, for PICC line placement prior to discharge w/ family teaching, pt/pt's family refused TEE, will arrange for oupt ID f/u in 1 week s/p discharge  *Acute toxic metabolic encephalopathy Most likely secondary to above Resolved  *Acute kidney injury most likely secondary to dehydration Secondary to sepsis, poor p.o. intake Resolved with IV fluids for rehydration   *Acute right lower extremity cellulitis Resolving Antibiotics per above  *Acute concern for coronavirus infection Ruled out Test negative  Clinical suspicion was very low  *Chronic diabetes mellitus type 2 Stable Continued  home regiment, sliding scale insulin Accu-Cheks per routine  *Chronic hypertension Continued home regiment  *Chronic multiple sclerosis with functional quadriplegia Stable Provided increased nursing care PRN, aspiration/fall/skin care precautions while in house  *Chronic sacral decubitus wound stage II Stable Continue local wound care, wound care nurse consulted, PT did see pt while in house - family wants pt home w/ services  DISCHARGE CONDITIONS:   stable  CONSULTS OBTAINED:  Treatment Team:  Yolonda Kida, MD Teodoro Spray, MD  DRUG ALLERGIES:   Allergies  Allergen Reactions  . Hydralazine Hcl Other (See Comments)    Severe hyponatremia  . Morphine And Related Shortness Of Breath  . Codeine Nausea And Vomiting    DISCHARGE MEDICATIONS:   Allergies as of 04/16/2018      Reactions   Hydralazine Hcl Other (See Comments)   Severe hyponatremia   Morphine And Related Shortness Of Breath   Codeine Nausea And Vomiting      Medication List    TAKE these medications   ampicillin  IVPB Inject 12 g into the vein continuous for 9 days. Infuse ampicillin 12gm over 24h as continuous infusion Indication:  Enterococcus bacteremia Last Day of Therapy:  04/25/2018 Labs - Once weekly:  CBC/D and CMP   Ascorbic Acid 125 MG Chew Chew 125 mg by mouth daily.   aspirin EC 81 MG tablet Take 81 mg by mouth at bedtime.   benazepril 40 MG tablet Commonly known as:  LOTENSIN Take 40 mg by mouth daily.   CALCIUM 500/D PO Take 1 tablet by mouth daily.   diphenhydrAMINE 25 mg capsule Commonly known as:  BENADRYL Take 50 mg by mouth at bedtime as needed for sleep.   docusate sodium 100 MG capsule Commonly known  as:  COLACE Take 100-200 mg by mouth 2 (two) times daily as needed for mild constipation.   EQL Probiotic Acidophilus Caps Take 1 capsule by mouth daily.   ferrous gluconate 324 MG tablet Commonly known as:  FERGON Take 1 tablet by mouth daily.    gabapentin 300 MG capsule Commonly known as:  NEURONTIN Take 300-600 mg by mouth 5 (five) times daily. Take two capsules (600 mg) by mouth every morning, one capsule (300 mg) midday, one capsule (300 mg) in afternoon and one capsule (300 mg) in the evening   glimepiride 4 MG tablet Commonly known as:  AMARYL Take 4 mg by mouth daily.   HM Vitamin D3 100 MCG (4000 UT) Caps Generic drug:  Cholecalciferol Take 1 capsule by mouth at bedtime.   interferon beta-1a 30 MCG injection Commonly known as:  AVONEX Inject 30 mcg into the muscle once a week.   Lantus SoloStar 100 UNIT/ML Solostar Pen Generic drug:  Insulin Glargine Inject 10 Units into the skin Nightly.   magnesium gluconate 500 MG tablet Commonly known as:  MAGONATE Take 500 mg by mouth daily.   methadone 5 MG tablet Commonly known as:  DOLOPHINE Take 5 mg by mouth every 6 (six) hours.   mometasone 50 MCG/ACT nasal spray Commonly known as:  NASONEX Place 2 sprays into the nose at bedtime.   MULTIVITAMIN PO Take 2 each by mouth at bedtime.   Narcan 4 MG/0.1ML Liqd nasal spray kit Generic drug:  naloxone Place 4 mg into the nose once.   omeprazole 20 MG capsule Commonly known as:  PRILOSEC Take 20 mg by mouth daily.   promethazine 12.5 MG tablet Commonly known as:  PHENERGAN Take 12.5 mg by mouth every 6 (six) hours as needed for nausea or vomiting.   silver sulfADIAZINE 1 % cream Commonly known as:  SILVADENE Apply 1 application topically 2 (two) times daily.   triamcinolone cream 0.1 % Commonly known as:  KENALOG Apply 1 application topically 2 (two) times daily.   VITAMIN B-6 PO Take 1 tablet by mouth at bedtime.   Vitamin C Chew Chew 1 each by mouth at bedtime.            Home Infusion Instuctions  (From admission, onward)         Start     Ordered   04/16/18 0000  Home infusion instructions [facility] May follow [place] Dosing Protocol; May administer Cathflo as needed to  maintain patency of vascular access device.; [place] of vascular access device: per [facility] Protocol: 0.9% NaCl pre/post medica...    Question Answer Comment  Instructions May follow [place] Dosing Protocol   Instructions May administer Cathflo as needed to maintain patency of vascular access device.   Instructions Flushing of vascular access device: per [facility] Protocol: 0.9% NaCl pre/post medication administration and prn patency; Heparin 100 u/ml, 79m for implanted ports and Heparin 10u/ml, 548mfor all other central venous catheters.   Instructions May follow [facility] Anaphylaxis Protocol for First Dose Administration in the home: 0.9% NaCl at 25-50 ml/hr to maintain IV access for protocol meds. Epinephrine 0.3 ml IV/IM PRN and Benadryl 25-50 IV/IM PRN s/s of anaphylaxis.   Instructions [facility] Infusion Coordinator (RN) to assist per patient IV care needs in the home PRN.      04/16/18 1143           DISCHARGE INSTRUCTIONS:   If you experience worsening of your admission symptoms, develop shortness  of breath, life threatening emergency, suicidal or homicidal thoughts you must seek medical attention immediately by calling 911 or calling your MD immediately  if symptoms less severe.  You Must read complete instructions/literature along with all the possible adverse reactions/side effects for all the Medicines you take and that have been prescribed to you. Take any new Medicines after you have completely understood and accept all the possible adverse reactions/side effects.   Please note  You were cared for by a hospitalist during your hospital stay. If you have any questions about your discharge medications or the care you received while you were in the hospital after you are discharged, you can call the unit and asked to speak with the hospitalist on call if the hospitalist that took care of you is not available. Once you are discharged, your primary care physician will handle any  further medical issues. Please note that NO REFILLS for any discharge medications will be authorized once you are discharged, as it is imperative that you return to your primary care physician (or establish a relationship with a primary care physician if you do not have one) for your aftercare needs so that they can reassess your need for medications and monitor your lab values.    Today   CHIEF COMPLAINT:   Chief Complaint  Patient presents with  . Fever    HISTORY OF PRESENT ILLNESS:  85 y.o. female with a known history per below, presenting from home via EMS with acute fever, altered mental status, confusion, in the emergency room patient was found to have left pneumonia on chest x-ray, white count 12,000, creatinine 1.5 with baseline normal, patient evaluated the bedside emergency room, no apparent distress, resting comfortably in bed, patient is poor historian due to confusion, per ED attending patient is low risk for coronavirus infection given no sick contacts, no family currently available, patient now be admitted for acute left kidney acquired pneumonia, acute kidney injury due to dehydration  VITAL SIGNS:  Blood pressure (!) 145/62, pulse (!) 56, temperature 98.4 F (36.9 C), temperature source Oral, resp. rate 19, height _0  (1.575 m), weight 91.5 kg, SpO2 97 %.  I/O:    Intake/Output Summary (Last 24 hours) at 04/16/2018 1143 Last data filed at 04/16/2018 0600 Gross per 24 hour  Intake -  Output 400 ml  Net -400 ml    PHYSICAL EXAMINATION:  GENERAL:  85 y.o.-year-old patient lying in the bed with no acute distress.  EYES: Pupils equal, round, reactive to light and accommodation. No scleral icterus. Extraocular muscles intact.  HEENT: Head atraumatic, normocephalic. Oropharynx and nasopharynx clear.  NECK:  Supple, no jugular venous distention. No thyroid enlargement, no tenderness.  LUNGS: Normal breath sounds bilaterally, no wheezing, rales,rhonchi or crepitation. No  use of accessory muscles of respiration.  CARDIOVASCULAR: S1, S2 normal. No murmurs, rubs, or gallops.  ABDOMEN: Soft, non-tender, non-distended. Bowel sounds present. No organomegaly or mass.  EXTREMITIES: No pedal edema, cyanosis, or clubbing.  NEUROLOGIC: Cranial nerves II through XII are intact. Muscle strength 5/5 in all extremities. Sensation intact. Gait not checked.  PSYCHIATRIC: The patient is alert and oriented x 3.  SKIN: No obvious rash, lesion, or ulcer.   DATA REVIEW:   CBC Recent Labs  Lab 04/14/18 0556  WBC 4.8  HGB 9.6*  HCT 30.4*  PLT 181    Chemistries  Recent Labs  Lab 04/12/18 0425  04/14/18 0556  NA 139   < > 139  K 4.0   < >  3.8  CL 107   < > 106  CO2 26   < > 25  GLUCOSE 48*   < > 98  BUN 25*   < > 12  CREATININE 1.43*   < > 0.88  CALCIUM 7.8*   < > 8.6*  MG 1.9  --   --   AST 25  --   --   ALT 15  --   --   ALKPHOS 42  --   --   BILITOT 0.3  --   --    < > = values in this interval not displayed.    Cardiac Enzymes No results for input(s): TROPONINI in the last 168 hours.  Microbiology Results  Results for orders placed or performed during the hospital encounter of 04/11/18  Culture, blood (Routine x 2)     Status: None   Collection Time: 04/11/18  7:30 AM  Result Value Ref Range Status   Specimen Description BLOOD R HAND  Final   Special Requests   Final    BOTTLES DRAWN AEROBIC AND ANAEROBIC Blood Culture results may not be optimal due to an excessive volume of blood received in culture bottles   Culture   Final    NO GROWTH 5 DAYS Performed at [facility], [address] [place], [place] [address]    Report Status 04/16/2018 FINAL  Final  Culture, blood (Routine x 2)     Status: Abnormal   Collection Time: 04/11/18  7:32 AM  Result Value Ref Range Status   Specimen Description   Final    BLOOD L HAND Performed at [facility], [address] [place], [place] [address]    Special Requests   Final     BOTTLES DRAWN AEROBIC AND ANAEROBIC Blood Culture results may not be optimal due to an excessive volume of blood received in culture bottles Performed at [facility], [address] [place], [place] [address]    Culture  Setup Time   Final    GRAM POSITIVE COCCI IN BOTH AEROBIC AND ANAEROBIC BOTTLES CRITICAL RESULT CALLED TO, READ BACK BY AND VERIFIED WITH: DAVID BESANTA 04/11/2018 AT 2250 BY H S Performed at [facility], [address] [place], [place] [address]    Culture ENTEROCOCCUS FAECALIS (A)  Final   Report Status 04/14/2018 FINAL  Final   Organism ID, Bacteria ENTEROCOCCUS FAECALIS  Final      Susceptibility   Enterococcus faecalis - MIC*    AMPICILLIN <=2 SENSITIVE Sensitive     VANCOMYCIN <=0.5 SENSITIVE Sensitive     GENTAMICIN SYNERGY SENSITIVE Sensitive     * ENTEROCOCCUS FAECALIS  Blood Culture ID Panel (Reflexed)     Status: Abnormal   Collection Time: 04/11/18  7:32 AM  Result Value Ref Range Status   Enterococcus species DETECTED (A) NOT DETECTED Final    Comment: CRITICAL RESULT CALLED TO, READ BACK BY AND VERIFIED WITH: DAVID BESANTI 3./28/2020 AT 2250 BY H SOEWARDIMAN    Vancomycin resistance NOT DETECTED NOT DETECTED Final   Listeria monocytogenes NOT DETECTED NOT DETECTED Final   Staphylococcus species NOT DETECTED NOT DETECTED Final   Staphylococcus aureus (BCID) NOT DETECTED NOT DETECTED Final   Streptococcus species NOT DETECTED NOT DETECTED Final   Streptococcus agalactiae NOT DETECTED NOT DETECTED Final   Streptococcus pneumoniae NOT DETECTED NOT DETECTED Final   Streptococcus pyogenes NOT DETECTED NOT DETECTED Final   Acinetobacter baumannii NOT DETECTED NOT DETECTED Final   Enterobacteriaceae  species NOT DETECTED NOT DETECTED Final   Enterobacter cloacae complex NOT DETECTED NOT DETECTED Final   Escherichia coli NOT DETECTED NOT DETECTED Final   Klebsiella oxytoca NOT DETECTED NOT DETECTED Final   Klebsiella pneumoniae NOT  DETECTED NOT DETECTED Final   Proteus species NOT DETECTED NOT DETECTED Final   Serratia marcescens NOT DETECTED NOT DETECTED Final   Haemophilus influenzae NOT DETECTED NOT DETECTED Final   Neisseria meningitidis NOT DETECTED NOT DETECTED Final   Pseudomonas aeruginosa NOT DETECTED NOT DETECTED Final   Candida albicans NOT DETECTED NOT DETECTED Final   Candida glabrata NOT DETECTED NOT DETECTED Final   Candida krusei NOT DETECTED NOT DETECTED Final   Candida parapsilosis NOT DETECTED NOT DETECTED Final   Candida tropicalis NOT DETECTED NOT DETECTED Final    Comment: Performed at [facility], [address] [place], [place] [address]  Urine culture     Status: None   Collection Time: 04/11/18  1:05 PM  Result Value Ref Range Status   Specimen Description   Final    URINE, RANDOM Performed at [facility], [address], [place], [place] [address]    Special Requests   Final    NONE Performed at [facility], [address] [place], [place] [address]    Culture   Final    Multiple bacterial morphotypes present, none predominant. Suggest appropriate recollection if clinically indicated.   Report Status 04/12/2018 FINAL  Final  Respiratory Panel by PCR     Status: None   Collection Time: 04/11/18  4:46 PM  Result Value Ref Range Status   Adenovirus NOT DETECTED NOT DETECTED Final   Coronavirus 229E NOT DETECTED NOT DETECTED Final    Comment: (NOTE) The Coronavirus on the Respiratory Panel, DOES NOT test for the novel  Coronavirus (2019 nCoV)    Coronavirus HKU1 NOT DETECTED NOT DETECTED Final   Coronavirus NL63 NOT DETECTED NOT DETECTED Final   Coronavirus OC43 NOT DETECTED NOT DETECTED Final   Metapneumovirus NOT DETECTED NOT DETECTED Final   Rhinovirus / Enterovirus NOT DETECTED NOT DETECTED Final   Influenza A NOT DETECTED NOT DETECTED Final   Influenza B NOT DETECTED NOT DETECTED Final   Parainfluenza Virus 1 NOT DETECTED NOT DETECTED Final    Parainfluenza Virus 2 NOT DETECTED NOT DETECTED Final   Parainfluenza Virus 3 NOT DETECTED NOT DETECTED Final   Parainfluenza Virus 4 NOT DETECTED NOT DETECTED Final   Respiratory Syncytial Virus NOT DETECTED NOT DETECTED Final   Bordetella pertussis NOT DETECTED NOT DETECTED Final   Chlamydophila pneumoniae NOT DETECTED NOT DETECTED Final   Mycoplasma pneumoniae NOT DETECTED NOT DETECTED Final    Comment: Performed at [facility], [address] [place], [place] [address]  MRSA PCR Screening     Status: None   Collection Time: 04/11/18  4:46 PM  Result Value Ref Range Status   MRSA by PCR NEGATIVE NEGATIVE Final    Comment:        The GeneXpert MRSA Assay (FDA approved for NASAL specimens only), is one component of a comprehensive MRSA colonization surveillance program. It is not intended to diagnose MRSA infection nor to guide or monitor treatment for MRSA infections. Performed at [facility], [address] [place], [place] [address]   Novel Coronavirus, NAA (hospital order; send-out to ref lab)     Status: None   Collection Time: 04/11/18  4:46 PM  Result Value Ref Range Status   SARS-CoV-2, NAA  NOT DETECTED NOT DETECTED Final    Comment: Negative (Not Detected) results do not exclude infection caused by SARS CoV 2 and should not be used as the sole basis for treatment or other patient management decisions. Optimum specimen types and timing for peak viral levels during infections caused  by SARS CoV 2 have not been determined. Collection of multiple specimens (types and time points) from the same patient may be necessary to detect the virus. Improper specimen collection and handling, sequence variability underlying assay primers and or probes, or the presence of organisms in  quantities less than the limit of detection of the assay may lead to false negative results. Positive and negative predictive values of testing are highly dependent on prevalence. False  negative results are more likely when prevalence of disease is high. (NOTE) The expected result is Negative (Not Detected). The SARS CoV 2 test is intended for the presumptive qualitative  detection of nucleic acid from SARS CoV 2 in upper and lower  respir atory specimens. Testing methodology is real time RT PCR. Test results must be correlated with clinical presentation and  evaluated in the context of other laboratory and epidemiologic data.  Test performance can be affected because the epidemiology and  clinical spectrum of infection caused by SARS CoV 2 is not fully  known. For example, the optimum types of specimens to collect and  when during the course of infection these specimens are most likely  to contain detectable viral RNA may not be known. This test has not been Food and Drug Administration (FDA) cleared or  approved and has been authorized by FDA under an Emergency Use  Authorization (EUA). The test is only authorized for the duration of  the declaration that circumstances exist justifying the authorization  of emergency use of in vitro diagnostic tests for detection and or  diagnosis of SARS CoV 2 under Section 564(b)(1) of the Act, 21 U.S.C.  section [phone] 3(b)(1), unless the authorization is terminated or   revoked sooner. [facility] is certified under the  Clinical Laboratory Improvement Amendments of 1988 (CLIA), 42 U.S.C.  section [phone], to perform high complexity tests. Performed at [id number] [address], [place], [place] [address] Laboratory Director: Loleta Books, MD Performed at [facility], [address], [place], [place] [address]    Coronavirus Source NASOPHARYNGEAL  Final    Comment: Performed at [facility], [address] [place], [place] [address]  Culture, blood (Routine X 2) w Reflex to ID Panel     Status: None (Preliminary result)   Collection  Time: 04/12/18  2:29 PM  Result Value Ref Range Status   Specimen Description BLOOD LAC  Final   Special Requests   Final    BOTTLES DRAWN AEROBIC AND ANAEROBIC Blood Culture adequate volume   Culture   Final    NO GROWTH 4 DAYS Performed at [facility], [address], [place], [place] [address]    Report Status PENDING  Incomplete    RADIOLOGY:  [place] Ekg Site Rite  Result Date: 04/16/2018 If Site Rite image not attached, placement could not be confirmed due to current cardiac rhythm.   EKG:   Orders placed or performed during the hospital encounter of 04/11/18  . EKG 12-Lead  . EKG 12-Lead  . EKG      Management plans discussed with the patient, family and they are in agreement.  CODE  STATUS:     Code Status Orders  (From admission, onward)         Start     Ordered   04/11/18 1731  Full code  Continuous     04/11/18 1731        Code Status History    Date Active Date Inactive Code Status Order ID Comments User Context   04/11/2018 1127 04/11/2018 1731 Full Code [id number]  Gorden Harms, MD ED   03/11/2018 1501 03/13/2018 1734 Full Code [id number]  Demetrios Loll, MD Inpatient   07/01/2014 1824 07/04/2014 1847 Full Code [id number]  Loletha Grayer, MD ED      TOTAL TIME TAKING CARE OF THIS PATIENT: 45 minutes.    Avel Peace Domnique Vantine M.D on 04/16/2018 at 11:43 AM  Between 7am to 6pm - Pager - [phone]  After 6pm go to [email or web address] - password [email or web address] [place] [facility]  Office  [phone]  CC: Primary care physician; Clarisse Gouge, MD   Note: This dictation was prepared with Dragon dictation along with smaller phrase technology. Any transcriptional errors that result from this process are unintentional.

## 2018-04-16 NOTE — TOC Progression Note (Addendum)
Transition of Care [facility]) - Progression Note    Patient Details  Name: Stefanie Pham MRN: [id number] Date of Birth: [birth year]  Transition of Care [facility]) CM/SW Contact  Darleene Cleaver, [place] Phone Number: 04/16/2018, 4:57 PM  Clinical Narrative: CSW spoke with Jeri Modena [phone] from the IV infusion company, she said she spoke with patient's daughter Stefanie Pham [phone] and they will be meeting at the hospital between 5pm and 6pm to complete home IV antibiotic training.   CSW then spoke with patient's daughter to ask if she would like to consider a hoyer lift for the patient, patient's daughter said not at this time.  PT recommended that patient go to SNF, however patient's family do not want her to go to a SNF.  Patient's daughter states that patient has a bedside commode, lift chair, wheelchair, and walker.  They will be receiving home health with [facility], and also patient will be receiving IV antibiotics.  Patient's daughter Stefanie Pham will be arriving at the hospital between 5pm and 6pm to meet with the infusion therapy agency to get trained on IV antibiotics.  CSW contacted Neurosurgeon to inform her that patient's daughter will be arriving to get home IV antibiotic training, [facility] stated she will notify the front desk that patient's daughter has been approved to visit patient for training.  Patient's daughter requested EMS transport due to patient's weakness, Picc line and mild confusion.  CSW updated bedside nurse and case manager.  Patient should be able to discharge back home tonight via EMS once IV antibiotic training has been completed with patient and her family.    Expected Discharge Plan: Home w Home Health Services Barriers to Discharge: (P) No Barriers Identified  Expected Discharge Plan and Services Expected Discharge Plan: Home w Home Health Services In-house Referral: Clinical Social Work Discharge Planning Services: CM Consult Post Acute Care Choice: Home  Health, Durable Medical Equipment Living arrangements for the past 2 months: Single Family Home Expected Discharge Date: 04/16/18                   HH Arranged: IV Antibiotics([facility])     Social Determinants of Health (SDOH) Interventions    Readmission Risk Interventions No flowsheet data found.

## 2018-04-16 NOTE — Evaluation (Signed)
Physical Therapy Evaluation Patient Details Name: Stefanie Pham MRN: [id number] DOB: 01-May-1933 Today's Date: 04/16/2018   History of Present Illness  85 y.o. female with a known history per below, presenting from home via EMS with acute fever, altered mental status, confusion, in the emergency room patient was found to have left pneumonia on chest x-ray.    Clinical Impression  Pt repeatedly reports feeling okay about going home despite the difficulty she had with every aspect of PT this session.  She was unable to tolerate even sitting at EOB and if she had, indeed, been transferring to/from [facility] with only single assist regularly (easily?) at home then she is not at all near her baseline.  Unsure as to how well transfers have actually been going, but overall is appears to this PT that she will struggle to stay safe at home with the current assist that is available.  She is unable tolerate even sitting at EOB and was right to be fearful of falling in attempting to get up even with +2 assist that was present, she did not seem to convert this fear to that reality that will be managing this at home.  A Hoyer lift could/would make transfers safer at home, but it appears that ultimately family should look into higher level of care going forward - however if she is going home HHPT would clearly be indicated.    Follow Up Recommendations SNF(pt and apparently family adamant about her going home)    Equipment Recommendations  Secondary school teacher)    Recommendations for Other Services       Precautions / Restrictions Precautions Precautions: Fall Restrictions Weight Bearing Restrictions: No      Mobility  Bed Mobility Overal bed mobility: Needs Assistance Bed Mobility: Supine to Sit     Supine to sit: Total assist     General bed mobility comments: Pt pushing back, unable to maintain sitting w/o heavy assist and obviously fearful and anxious about even trying to get toward EOB  Transfers                  General transfer comment: Pt adamant about not wanting to get up because she is too weak and fearful, then nonchalantly talks about how her husband will easily be able to transfer when she is home...?  Ambulation/Gait                Stairs            Wheelchair Mobility    Modified Rankin (Stroke Patients Only)       Balance Overall balance assessment: Needs assistance   Sitting balance-Leahy Scale: Zero Sitting balance - Comments: Pt needing constant assist just to maintain somewhat upright at EOB                                     Pertinent Vitals/Pain Pain Assessment: (unrated, highly anxious about any movement)    Home Living Family/patient expects to be discharged to:: Private residence Living Arrangements: Spouse/significant other   Type of Home: House Home Access: (pt reports that they have no steps to enter?)              Prior Function Level of Independence: Needs assistance   Gait / Transfers Assistance Needed: reports she is able to transfer multiple times/day to use BSC with assist from family     Comments: Pt reports that she  sleeps in and spends all day in recliner     Hand Dominance        Extremity/Trunk Assessment   Upper Extremity Assessment Upper Extremity Assessment: Generalized weakness(R UE weaker than L)    Lower Extremity Assessment Lower Extremity Assessment: Generalized weakness(very limited ROM in b/l knees R < L, pain limited t/o)       Communication   Communication: No difficulties  Cognition Arousal/Alertness: Awake/alert Behavior During Therapy: Anxious;Restless Overall Cognitive Status: Within Functional Limits for tasks assessed                                        General Comments      Exercises     Assessment/Plan    PT Assessment Patient needs continued PT services  PT Problem List Decreased strength;Decreased range of motion;Decreased  activity tolerance;Decreased balance;Decreased mobility;Decreased knowledge of use of DME;Decreased safety awareness;Pain       PT Treatment Interventions Therapeutic exercise;Functional mobility training;Therapeutic activities;Gait training;DME instruction;Balance training;Patient/family education    PT Goals (Current goals can be found in the Care Plan section)  Acute Rehab PT Goals Patient Stated Goal: go home PT Goal Formulation: With patient Time For Goal Achievement: 04/30/18 Potential to Achieve Goals: Poor    Frequency Min 2X/week   Barriers to discharge Decreased caregiver support      Co-evaluation               AM-PAC PT "6 Clicks" Mobility  Outcome Measure Help needed turning from your back to your side while in a flat bed without using bedrails?: Total Help needed moving from lying on your back to sitting on the side of a flat bed without using bedrails?: Total Help needed moving to and from a bed to a chair (including a wheelchair)?: Total Help needed standing up from a chair using your arms (e.g., wheelchair or bedside chair)?: Total Help needed to walk in hospital room?: Total Help needed climbing 3-5 steps with a railing? : Total 6 Click Score: 6    End of Session   Activity Tolerance: (anxious/feaful) Patient left: with bed alarm set;with call bell/phone within reach Nurse Communication: Mobility status PT Visit Diagnosis: Muscle weakness (generalized) (M62.81);Unsteadiness on feet (R26.81)    Time: 6767-2094 PT Time Calculation (min) (ACUTE ONLY): 35 min   Charges:   PT Evaluation $PT Eval Moderate Complexity: 1 Mod PT Treatments $Therapeutic Activity: 8-22 mins        Malachi Pro, DPT 04/16/2018, 1:51 PM

## 2018-04-16 NOTE — Progress Notes (Signed)
PHARMACY CONSULT NOTE FOR:  OUTPATIENT  PARENTERAL ANTIBIOTIC THERAPY (OPAT)  Indication: Enterococcus bacteremia Regimen: ampicillin 12g/day infusion as continuous infusion End date: 04/25/2018  IV antibiotic discharge orders are pended. To discharging provider:  please sign these orders via discharge navigator,  Select New Orders & click on the button choice - Manage This Unsigned Work.     Thank you for allowing pharmacy to be a part of this patient's care.  Juliette Alcide, PharmD, BCPS.   Work Cell: [phone] 04/16/2018 11:03 AM

## 2018-04-16 NOTE — Consult Note (Addendum)
Cardiology Consultation Note    Patient ID: Stefanie Pham, MRN: [id number], DOB/AGE: 04-02-1933 85 y.o. Admit date: 04/11/2018   Date of Consult: 04/16/2018 Primary Physician: Clarisse Gouge, MD Primary Cardiologist: none  Chief Complaint: fever  Reason for Consultation: bacteremia for tee per id Requesting MD: Dr. Jerelyn Charles  HPI: Stefanie Pham is a 85 y.o. female with history of diabetes mellitus, hypertension, multiple sclerosis who is admitted on April 11, 2018 with complaints of altered mental status confusion and acute fever.  She was found to have a left sided pneumonia with a white count of 12,000 and a creatinine of 1.5.  She was not in acute respiratory distress.  She had no sick contacts.  She had no extensive travel to high risk areas.  She was admitted with acute kidney injury and community-acquired pneumonia.  Blood cultures were positive for enterococcus faecalis.  She was influenza negative as well as coated negative.  She is currently hemodynamically stable.  Transthoracic echo read as showing normal LV function with trace AI, mild TR.  Asked to perform transesophageal echo by infectious disease physician.  Past Medical History:  Diagnosis Date  . Diabetes mellitus without complication ([place])   . Diverticulitis   . Hernia, inguinal   . Hypertension   . Multiple sclerosis [facility])       Surgical History:  Past Surgical History:  Procedure Laterality Date  . ABDOMINAL HYSTERECTOMY    . AMPUTATION TOE Left 03/11/2018   Procedure: AMPUTATION TOE LEFT GREAT TOE;  Surgeon: Sharlotte Alamo, DPM;  Location: [facility] ORS;  Service: Podiatry;  Laterality: Left;  . APPENDECTOMY    . CERVICAL SPINE SURGERY N/A   . CHOLECYSTECTOMY    . HERNIA REPAIR    . ORIF ANKLE FRACTURE Left   . REPLACEMENT TOTAL KNEE Left 2     Home Meds: Prior to Admission medications   Medication Sig Start Date End Date Taking? Authorizing Provider  Ascorbic Acid 125 MG CHEW Chew 125 mg by mouth daily.    Yes [provider]  aspirin EC 81 MG tablet Take 81 mg by mouth at bedtime.   Yes [provider]  benazepril (LOTENSIN) 40 MG tablet Take 40 mg by mouth daily.   Yes [provider]  Bioflavonoid Products (VITAMIN C) CHEW Chew 1 each by mouth at bedtime.   Yes [provider]  Calcium Carbonate-Vitamin D (CALCIUM 500/D PO) Take 1 tablet by mouth daily.   Yes [provider]  Cholecalciferol (HM VITAMIN D3) 4000 UNITS CAPS Take 1 capsule by mouth at bedtime.   Yes [provider]  diphenhydrAMINE (BENADRYL) 25 mg capsule Take 50 mg by mouth at bedtime as needed for sleep.   Yes [provider]  docusate sodium (COLACE) 100 MG capsule Take 100-200 mg by mouth 2 (two) times daily as needed for mild constipation.   Yes [provider]  ferrous gluconate (FERGON) 324 MG tablet Take 1 tablet by mouth daily. 01/21/18  Yes [provider]  gabapentin (NEURONTIN) 300 MG capsule Take 300-600 mg by mouth 5 (five) times daily. Take two capsules (600 mg) by mouth every morning, one capsule (300 mg) midday, one capsule (300 mg) in afternoon and one capsule (300 mg) in the evening 04/10/18  Yes [provider]  glimepiride (AMARYL) 4 MG tablet Take 4 mg by mouth daily. 03/09/18 03/09/19 Yes [provider]  Insulin Glargine (LANTUS SOLOSTAR) 100 UNIT/ML Solostar Pen Inject 10 Units into the  skin Nightly. 01/19/18 02/04/2019 Yes [provider]  Lactobacillus Acid-Pectin (EQL PROBIOTIC ACIDOPHILUS) CAPS Take 1 capsule by mouth daily.   Yes [provider]  magnesium gluconate (MAGONATE) 500 MG tablet Take 500 mg by mouth daily.   Yes [provider]  methadone (DOLOPHINE) 5 MG tablet Take 5 mg by mouth every 6 (six) hours.   Yes [provider]  mometasone (NASONEX) 50 MCG/ACT nasal spray Place 2 sprays into the nose at bedtime.   Yes [provider]  Multiple Vitamins-Minerals  (MULTIVITAMIN PO) Take 2 each by mouth at bedtime.   Yes [provider]  omeprazole (PRILOSEC) 20 MG capsule Take 20 mg by mouth daily.   Yes [provider]  Pyridoxine HCl (VITAMIN B-6 PO) Take 1 tablet by mouth at bedtime.   Yes [provider]  interferon beta-1a (AVONEX) 30 MCG injection Inject 30 mcg into the muscle once a week.    [provider]  NARCAN 4 MG/0.1ML LIQD nasal spray kit Place 4 mg into the nose once. 10/30/17   [provider]  promethazine (PHENERGAN) 12.5 MG tablet Take 12.5 mg by mouth every 6 (six) hours as needed for nausea or vomiting.    [provider]  silver sulfADIAZINE (SILVADENE) 1 % cream Apply 1 application topically 2 (two) times daily. 01/15/18   [provider]  triamcinolone cream (KENALOG) 0.1 % Apply 1 application topically 2 (two) times daily. 06/30/17 06/30/18  [provider]    Inpatient Medications:  . amLODipine  5 mg Oral QHS  . aspirin EC  81 mg Oral QHS  . benazepril  20 mg Oral Daily  . enoxaparin (LOVENOX) injection  40 mg Subcutaneous Q24H  . ferrous gluconate  324 mg Oral Daily  . fluticasone  1 spray Each Nare Daily  . gabapentin  300 mg Oral TID  . insulin aspart  0-5 Units Subcutaneous QHS  . insulin aspart  0-9 Units Subcutaneous TID WC  . magnesium gluconate  500 mg Oral Daily  . mouth rinse  15 mL Mouth Rinse BID  . methadone  5 mg Oral Q6H  . multivitamin with minerals   Oral QHS  . polyethylene glycol  17 g Oral Daily  . saccharomyces boulardii  250 mg Oral Daily  . vitamin C  250 mg Oral Daily   . sodium chloride 250 mL (04/14/18 1755)  . sodium chloride    . sodium chloride    . ampicillin (OMNIPEN) IV 2 g (04/16/18 0625)  . clindamycin (CLEOCIN) IV 600 mg (04/16/18 3007)    Allergies:  Allergies  Allergen Reactions  . Hydralazine Hcl Other (See Comments)    Severe hyponatremia  . Morphine And Related Shortness Of Breath  . Codeine Nausea And  Vomiting    Social History   Socioeconomic History  . Marital status: Married    Spouse name: Not on file  . Number of children: Not on file  . Years of education: Not on file  . Highest education level: Not on file  Occupational History  . Not on file  Social Needs  . Financial resource strain: Not on file  . Food insecurity:    Worry: Not on file    Inability: Not on file  . Transportation needs:    Medical: Not on file    Non-medical: Not on file  Tobacco Use  . Smoking status: Never Smoker  . Smokeless tobacco: Never Used  Substance and Sexual Activity  .  Alcohol use: No  . Drug use: No  . Sexual activity: Not on file  Lifestyle  . Physical activity:    Days per week: Not on file    Minutes per session: Not on file  . Stress: Not on file  Relationships  . Social connections:    Talks on phone: Not on file    Gets together: Not on file    Attends religious service: Not on file    Active member of club or organization: Not on file    Attends meetings of clubs or organizations: Not on file    Relationship status: Not on file  . Intimate partner violence:    Fear of current or ex partner: Not on file    Emotionally abused: Not on file    Physically abused: Not on file    Forced sexual activity: Not on file  Other Topics Concern  . Not on file  Social History Narrative  . Not on file     Family History  Problem Relation Age of Onset  . COPD Father      Review of Systems: A 12-system review of systems was performed and is negative except as noted in the HPI.  Labs: No results for input(s): CKTOTAL, CKMB, TROPONINI in the last 72 hours. Lab Results  Component Value Date   WBC 4.8 04/14/2018   HGB 9.6 (L) 04/14/2018   HCT 30.4 (L) 04/14/2018   MCV 99.3 04/14/2018   PLT 181 04/14/2018    Recent Labs  Lab 04/12/18 0425  04/14/18 0556  NA 139   < > 139  K 4.0   < > 3.8  CL 107   < > 106  CO2 26   < > 25  BUN 25*   < > 12  CREATININE 1.43*   < >  0.88  CALCIUM 7.8*   < > 8.6*  PROT 6.2*  --   --   BILITOT 0.3  --   --   ALKPHOS 42  --   --   ALT 15  --   --   AST 25  --   --   GLUCOSE 48*   < > 98   < > = values in this interval not displayed.   No results found for: CHOL, HDL, LDLCALC, TRIG No results found for: DDIMER  Radiology/Studies:  Dg Chest Port 1 View  Result Date: 04/11/2018 CLINICAL DATA:  Fever.  Sepsis. EXAM: PORTABLE CHEST 1 VIEW COMPARISON:  Radiographs of March 11, 2018. FINDINGS: Stable cardiomediastinal silhouette. No pneumothorax or pleural effusion is noted. Right lung is clear. Mild left basilar subsegmental atelectasis or infiltrate is noted. The visualized skeletal structures are unremarkable. IMPRESSION: Mild left basilar subsegmental atelectasis or infiltrate. Electronically Signed   By: Marijo Conception, M.D.   On: 04/11/2018 07:48    Wt Readings from Last 3 Encounters:  04/16/18 91.5 kg  03/11/18 99.8 kg  07/01/14 100.9 kg    EKG: Normal sinus rhythm with first-degree AV block with no ischemic changes.  Physical Exam: Obese Caucasian female Blood pressure (!) 145/62, pulse (!) 56, temperature 98.4 F (36.9 C), temperature source Oral, resp. rate 19, height '5\' 2"'$  (1.575 m), weight 91.5 kg, SpO2 97 %. Body mass index is 36.9 kg/m. General: Well developed, well nourished, in no acute distress. Head: Normocephalic, atraumatic, sclera non-icteric, no xanthomas, nares are without discharge.  Neck: Negative for carotid bruits. JVD not elevated. Lungs: Clear bilaterally to auscultation without wheezes,  rales, or rhonchi. Breathing is unlabored. Heart: RRR with S1 S2. No murmurs, rubs, or gallops appreciated. Abdomen: Soft, non-tender, non-distended with normoactive bowel sounds. No hepatomegaly. No rebound/guarding. No obvious abdominal masses. Msk:  Strength and tone appear normal for age. Extremities: Cellulitis noted on right lower extremity with osteomyelitis in her left great toe status post  partial amputation Neuro: Alert and oriented X 3. No facial asymmetry. No focal deficit. Moves all extremities spontaneously. Psych:  Responds to questions appropriately with a normal affect.     Assessment and Plan  85 year old female with history of multiple sclerosis admitted with altered mental status, fever with Enterococcus faecalis blood cultures positive.  Covid negative.  Transthoracic echo showed no significant valvular abnormalities.  Asked to do transesophageal echo by infectious disease consultant.  States it is necessary for this information to guide further antimicrobial therapy.  Risk and benefits discussed with patient.  She is somewhat hesitant to proceed.  Plan to proceed with a transesophageal echo later today if patient agrees to the procedure.  Would keep n.p.o. until study is completed or the patient defers. Signed, Teodoro Spray MD 04/16/2018, 8:55 AM Pager: [phone]   I had a long discussion with the patient's husband at the patient's request.  He would like to defer transesophageal echo at least for right now.  Risk and benefits of the procedure were discussed at length.  He would like to defer the procedure.  Should her clinical situation change, he is willing to have it be reconsidered.

## 2018-04-16 NOTE — Progress Notes (Signed)
Peripherally Inserted Central Catheter/Midline Placement  The IV Nurse has discussed with the patient and/or persons authorized to consent for the patient, the purpose of this procedure and the potential benefits and risks involved with this procedure.  The benefits include less needle sticks, lab draws from the catheter, and the patient may be discharged home with the catheter. Risks include, but not limited to, infection, bleeding, blood clot (thrombus formation), and puncture of an artery; nerve damage and irregular heartbeat and possibility to perform a PICC exchange if needed/ordered by physician.  Alternatives to this procedure were also discussed.  [facility] PICC patient education guide, fact sheet on infection prevention and patient information card has been provided to patient /or left at bedside.    PICC/Midline Placement Documentation  PICC Single Lumen 04/16/18 Right Basilic 38 cm 0 cm (Active)  Indication for Insertion or Continuance of Line Home intravenous therapies (PICC only) 04/16/2018  4:01 PM  Exposed Catheter (cm) 0 cm 04/16/2018  4:01 PM  Site Assessment Clean;Dry;Intact 04/16/2018  4:01 PM  Line Status Flushed;Blood return noted;Saline locked 04/16/2018  4:01 PM  Dressing Type Transparent;Securing device 04/16/2018  4:01 PM  Dressing Status Clean;Dry;Intact;Antimicrobial disc in place 04/16/2018  4:01 PM  Dressing Change Due 04/23/18 04/16/2018  4:01 PM       Romie Jumper 04/16/2018, 4:03 PM

## 2018-04-16 NOTE — TOC Progression Note (Addendum)
Transition of Care [facility]) - Progression Note    Patient Details  Name: Stefanie Pham MRN: [id number] Date of Birth: 1933/05/31  Transition of Care [facility]) CM/SW Contact  Sherren Kerns, RN Phone Number: 04/16/2018, 10:53 AM  Clinical Narrative:    Referral made to Jeri Modena for home IV antibiotics and accepted.  She will be by this afternoon to visit patient.    Notified [name] to call patient's daughter Bjorn Loser [phone] as patient has mild confusion.   Expected Discharge Plan: Home w Home Health Services Barriers to Discharge: Continued Medical Work up  Expected Discharge Plan and Services Expected Discharge Plan: Home w Home Health Services In-house Referral: Clinical Social Work Discharge Planning Services: CM Consult Post Acute Care Choice: Home Health, Durable Medical Equipment Living arrangements for the past 2 months: Single Family Home                           Social Determinants of Health (SDOH) Interventions    Readmission Risk Interventions No flowsheet data found.

## 2018-04-16 NOTE — Treatment Plan (Signed)
Diagnosis: Enterococcus abcteremia Baseline Creatinine < 1   Allergies  Allergen Reactions  . Hydralazine Hcl Other (See Comments)    Severe hyponatremia  . Morphine And Related Shortness Of Breath  . Codeine Nausea And Vomiting    OPAT Orders Discharge antibiotics: Ampicillin 2 grams IV every 4 hours until 04/25/18 [facility] Care Per Protocol:  Labs weekly while on IV antibiotics: __X CBC with differential _X_ CMP  _X_ Please pull PIC at completion of IV antibiotics Fax weekly labs to [phone] promptly   Call [phone] with any questions or concerns

## 2018-04-16 NOTE — TOC Progression Note (Signed)
Transition of Care [facility]) - Progression Note    Patient Details  Name: Stefanie Pham MRN: [id number] Date of Birth: 01/28/1933  Transition [facility] Care [facility]) CM/SW Contact  Darleene Cleaver, [place] Phone Number: 04/16/2018, 10:51 AM  Clinical Narrative:  Patient will need home health PT, nursing, aide, and social worker.  CSW contacted [facility] since they are in network with patient's insurance company.  Patient's husband did not have a preference for home health agency.  Patient will now be discharging with home health antibiotics, CSW notified case manager, she will follow up with infusion company.  CSW also contacted DME equipment to find out how much a hoyer lift will cost for patient.  CSW to continue to facilitate discharge planning.    Expected Discharge Plan: Home w Home Health Services Barriers to Discharge: Continued Medical Work up  Expected Discharge Plan and Services Expected Discharge Plan: Home w Home Health Services In-house Referral: Clinical Social Work Discharge Planning Services: CM Consult Post Acute Care Choice: Home Health, Durable Medical Equipment Living arrangements for the past 2 months: Single Family Home                           Social Determinants of Health (SDOH) Interventions    Readmission Risk Interventions No flowsheet data found.

## 2018-04-17 LAB — CULTURE, BLOOD (ROUTINE X 2)
Culture: NO GROWTH
Special Requests: ADEQUATE

## 2018-04-19 ENCOUNTER — Emergency Department
Admission: EM | Admit: 2018-04-19 | Discharge: 2018-04-20 | Disposition: A | Discharge status: Home / Self Care | Payer: Medicare Other | Attending: Emergency Medicine | Admitting: Emergency Medicine

## 2018-04-19 DIAGNOSIS — E119 Type 2 diabetes mellitus without complications: Secondary | ICD-10-CM | POA: Insufficient documentation

## 2018-04-19 DIAGNOSIS — I10 Essential (primary) hypertension: Secondary | ICD-10-CM | POA: Diagnosis not present

## 2018-04-19 DIAGNOSIS — Y828 Other medical devices associated with adverse incidents: Secondary | ICD-10-CM | POA: Diagnosis not present

## 2018-04-19 DIAGNOSIS — Z7982 Long term (current) use of aspirin: Secondary | ICD-10-CM | POA: Diagnosis not present

## 2018-04-19 DIAGNOSIS — T82838A Hemorrhage of vascular prosthetic devices, implants and grafts, initial encounter: Secondary | ICD-10-CM | POA: Diagnosis not present

## 2018-04-19 DIAGNOSIS — Z794 Long term (current) use of insulin: Secondary | ICD-10-CM | POA: Insufficient documentation

## 2018-04-19 DIAGNOSIS — Z96652 Presence of left artificial knee joint: Secondary | ICD-10-CM | POA: Insufficient documentation

## 2018-04-19 DIAGNOSIS — Z79899 Other long term (current) drug therapy: Secondary | ICD-10-CM | POA: Insufficient documentation

## 2018-04-19 NOTE — ED Triage Notes (Signed)
Patient coming [facility] from home for bleeding around PICC line and possible displacement of PICC line. Patient combative with EMS, therefore no vital signs obtained during transport.

## 2018-04-20 ENCOUNTER — Emergency Department: Payer: Medicare Other

## 2018-04-20 NOTE — ED Notes (Signed)
Pressure dressing applied.

## 2018-04-20 NOTE — ED Notes (Signed)
IV team RN at bedside.  

## 2018-04-20 NOTE — ED Notes (Signed)
Reviewed discharge instructions, follow-up care with patient. Patient verbalized understanding of all information reviewed. Patient stable, with no distress noted at this time.    

## 2018-04-20 NOTE — ED Notes (Signed)
ED Provider at bedside. 

## 2018-04-20 NOTE — ED Notes (Addendum)
Spoke with pt's daughter Bjorn Loser who states to send pt back in an ambulance. Notified MD.

## 2018-04-20 NOTE — ED Notes (Signed)
Note, hr on monitor, not 30 as reported by POX.

## 2018-04-20 NOTE — Progress Notes (Signed)
IV RN consulted for possible SL PICC displacement. Site found to have old bloody drainage which had clotted. 0 cm of PICC line exposed. Dressing, stat lock, biopatch, and cap changed. Line with great blood return.

## 2018-04-20 NOTE — Discharge Instructions (Addendum)
The IV team has checked your PICC line and placed a new dressing.  Continue IV antibiotics as instructed.  Return to the ER for worsening symptoms, persistent vomiting, difficulty breathing or other concerns.

## 2018-04-20 NOTE — ED Provider Notes (Signed)
[facility] Emergency Department Provider Note   ____________________________________________   First MD Initiated Contact with Patient 04/20/18 0002     (approximate)  I have reviewed the triage vital signs and the nursing notes.   HISTORY  Chief Complaint Vascular Access Problem    HPI Stefanie Pham is a 85 y.o. female brought to the ED from home via EMS status post possible PICC line displacement.  Patient was recently in the hospital and discharged on PICC line for IV antibiotics.  Family states PICC line was possibly displaced tonight.  Bleeding noted through Biopatch and underneath dressing.  Patient was initially combative with EMS and it turns out she was extremely anxious.  She was able to be verbally redirected.  Denies current fever, chills, chest pain, shortness of breath, abdominal pain, nausea or vomiting.      Past Medical History:  Diagnosis Date  . Diabetes mellitus without complication ([place])   . Diverticulitis   . Hernia, inguinal   . Hypertension   . Multiple sclerosis [facility])     Patient Active Problem List   Diagnosis Date Noted  . CAP (community acquired pneumonia) 04/11/2018  . Osteomyelitis ([place]) 03/11/2018  . Hyponatremia 07/01/2014    Past Surgical History:  Procedure Laterality Date  . ABDOMINAL HYSTERECTOMY    . AMPUTATION TOE Left 03/11/2018   Procedure: AMPUTATION TOE LEFT GREAT TOE;  Surgeon: Sharlotte Alamo, DPM;  Location: [facility] ORS;  Service: Podiatry;  Laterality: Left;  . APPENDECTOMY    . CERVICAL SPINE SURGERY N/A   . CHOLECYSTECTOMY    . HERNIA REPAIR    . ORIF ANKLE FRACTURE Left   . REPLACEMENT TOTAL KNEE Left 2    Prior to Admission medications   Medication Sig Start Date End Date Taking? Authorizing Provider  ampicillin IVPB Inject 12 g into the vein continuous for 9 days. Infuse ampicillin 12gm over 24h as continuous infusion Indication:  Enterococcus bacteremia Last Day of Therapy:  04/25/2018  Labs - Once weekly:  CBC/D and CMP 04/16/18 04/25/18  Salary, Avel Peace, MD  Ascorbic Acid 125 MG CHEW Chew 125 mg by mouth daily.    [provider]  aspirin EC 81 MG tablet Take 81 mg by mouth at bedtime.    [provider]  benazepril (LOTENSIN) 40 MG tablet Take 40 mg by mouth daily.    [provider]  Bioflavonoid Products (VITAMIN C) CHEW Chew 1 each by mouth at bedtime.    [provider]  Calcium Carbonate-Vitamin D (CALCIUM 500/D PO) Take 1 tablet by mouth daily.    [provider]  Cholecalciferol (HM VITAMIN D3) 4000 UNITS CAPS Take 1 capsule by mouth at bedtime.    [provider]  diphenhydrAMINE (BENADRYL) 25 mg capsule Take 50 mg by mouth at bedtime as needed for sleep.    [provider]  docusate sodium (COLACE) 100 MG capsule Take 100-200 mg by mouth 2 (two) times daily as needed for mild constipation.    [provider]  ferrous gluconate (FERGON) 324 MG tablet Take 1 tablet by mouth daily. 01/21/18   [provider]  gabapentin (NEURONTIN) 300 MG capsule Take 300-600 mg by mouth 5 (five) times daily. Take two capsules (600 mg) by mouth every morning, one capsule (300 mg) midday, one capsule (300 mg) in afternoon and one capsule (300 mg) in the evening 04/10/18   [provider]  glimepiride (AMARYL) 4 MG tablet Take 4 mg by mouth  daily. 03/09/18 03/09/19  [provider]  Insulin Glargine (LANTUS SOLOSTAR) 100 UNIT/ML Solostar Pen Inject 10 Units into the skin Nightly. 01/19/18 02/04/2019  [provider]  interferon beta-1a (AVONEX) 30 MCG injection Inject 30 mcg into the muscle once a week.    [provider]  Lactobacillus Acid-Pectin (EQL PROBIOTIC ACIDOPHILUS) CAPS Take 1 capsule by mouth daily.    [provider]  magnesium gluconate (MAGONATE) 500 MG tablet Take 500 mg by mouth daily.    [provider]  methadone (DOLOPHINE) 5 MG tablet Take 5 mg by  mouth every 6 (six) hours.    [provider]  mometasone (NASONEX) 50 MCG/ACT nasal spray Place 2 sprays into the nose at bedtime.    [provider]  Multiple Vitamins-Minerals (MULTIVITAMIN PO) Take 2 each by mouth at bedtime.    [provider]  NARCAN 4 MG/0.1ML LIQD nasal spray kit Place 4 mg into the nose once. 10/30/17   [provider]  omeprazole (PRILOSEC) 20 MG capsule Take 20 mg by mouth daily.    [provider]  promethazine (PHENERGAN) 12.5 MG tablet Take 12.5 mg by mouth every 6 (six) hours as needed for nausea or vomiting.    [provider]  Pyridoxine HCl (VITAMIN B-6 PO) Take 1 tablet by mouth at bedtime.    [provider]  silver sulfADIAZINE (SILVADENE) 1 % cream Apply 1 application topically 2 (two) times daily. 01/15/18   [provider]  triamcinolone cream (KENALOG) 0.1 % Apply 1 application topically 2 (two) times daily. 06/30/17 06/30/18  [provider]    Allergies Hydralazine hcl; Morphine and related; and Codeine  Family History  Problem Relation Age of Onset  . COPD Father     Social History Social History   Tobacco Use  . Smoking status: Never Smoker  . Smokeless tobacco: Never Used  Substance Use Topics  . Alcohol use: No  . Drug use: No    Review of Systems  Constitutional: No fever/chills Eyes: No visual changes. ENT: No sore throat. Cardiovascular: Denies chest pain. Respiratory: Denies shortness of breath. Gastrointestinal: No abdominal pain.  No nausea, no vomiting.  No diarrhea.  No constipation. Genitourinary: Negative for dysuria. Musculoskeletal: Negative for back pain. Skin: Negative for rash. Neurological: Negative for headaches, focal weakness or numbness. Vascular: Positive for possible PICC line displacement.  ____________________________________________   PHYSICAL EXAM:  VITAL SIGNS: ED Triage Vitals [04/20/18 0001]  Enc Vitals Group      BP (!) 159/116     Pulse Rate 85     Resp 20     Temp 98.3 F (36.8 C)     Temp Source Oral     SpO2 99 %     Weight      Height      Head Circumference      Peak Flow      Pain Score      Pain Loc      Pain Edu?      Excl. in [place]?     Constitutional: Alert and oriented. Well appearing and in mild acute distress. Eyes: Conjunctivae are normal. PERRL. EOMI. Head: Atraumatic. Nose: No congestion/rhinnorhea. Mouth/Throat: Mucous membranes are moist.  Oropharynx non-erythematous. Neck: No stridor.   Cardiovascular: Normal rate, regular rhythm. Grossly normal heart sounds.  Good peripheral circulation. Respiratory: Normal respiratory effort.  No retractions. Lungs CTAB. Gastrointestinal: Soft and nontender. No distention. No abdominal bruits. No CVA tenderness. Musculoskeletal: Right PICC  line noted with blood saturation through Biopatch under Tegaderm.  No lower extremity tenderness nor edema.  No joint effusions. Neurologic:  Normal speech and language. No gross focal neurologic deficits are appreciated. No gait instability. Skin:  Skin is warm, dry and intact. No rash noted. Psychiatric: Mood and affect are anxious. Speech and behavior are normal.  ____________________________________________   LABS (all labs ordered are listed, but only abnormal results are displayed)  Labs Reviewed - No data to display ____________________________________________  EKG  None ____________________________________________  RADIOLOGY  ED MD interpretation: None  Official radiology report(s): No results found.  ____________________________________________   PROCEDURES  Procedure(s) performed (including Critical Care):  Procedures   ____________________________________________   INITIAL IMPRESSION / ASSESSMENT AND PLAN / ED COURSE  As part of my medical decision making, I reviewed the following data within the [place] notes reviewed and  incorporated, Old chart reviewed, Radiograph reviewed, A consult was requested and obtained from this/these consultant(s) IV team and Notes from prior ED visits      85 year old female who presents with possible displacement of her PICC line.  Dressing left in place and IV team consulted who requested chest x-ray to evaluate tip of PICC line placement.  Aarushi Oneika Simonian was evaluated in Emergency Department on 04/20/2018 for the symptoms described in the history of present illness. She was evaluated in the context of the global COVID-19 pandemic, which necessitated consideration that the patient might be at risk for infection with the SARS-CoV-2 virus that causes COVID-19. Institutional protocols and algorithms that pertain to the evaluation of patients at risk for COVID-19 are in a state of rapid change based on information released by regulatory bodies including the CDC and federal and state organizations. These policies and algorithms were followed during the patient's care in the ED.   Clinical Course as of Apr 19 144  Mon Apr 20, 2018  0129 IV team has checked the PICC line; see note in chart.  They have advised against chest x-ray placement since PICC line was not dislodged.  And is eager for discharge home.  Strict return precautions given.  Patient verbalizes understanding and agrees with plan of care.   [JS]    Clinical Course User Index [JS] Paulette Blanch, MD     ____________________________________________   FINAL CLINICAL IMPRESSION(S) / ED DIAGNOSES  Final diagnoses:  Bleeding from PICC line, initial encounter [facility])     ED Discharge Orders    None       Note:  This document was prepared using Dragon voice recognition software and may include unintentional dictation errors.   Paulette Blanch, MD 04/20/18 Delrae Rend

## 2019-01-19 ENCOUNTER — Emergency Department: Payer: Medicare PPO

## 2019-01-19 ENCOUNTER — Inpatient Hospital Stay: Payer: Medicare PPO

## 2019-01-19 ENCOUNTER — Encounter: Payer: Self-pay | Admitting: Emergency Medicine

## 2019-01-19 ENCOUNTER — Inpatient Hospital Stay
Admission: EM | Admit: 2019-01-19 | Discharge: 2019-02-15 | DRG: 871 | Disposition: E | Payer: Medicare PPO | Attending: Internal Medicine | Admitting: Internal Medicine

## 2019-01-19 ENCOUNTER — Other Ambulatory Visit: Payer: Self-pay

## 2019-01-19 DIAGNOSIS — Z515 Encounter for palliative care: Secondary | ICD-10-CM | POA: Diagnosis not present

## 2019-01-19 DIAGNOSIS — Z9049 Acquired absence of other specified parts of digestive tract: Secondary | ICD-10-CM

## 2019-01-19 DIAGNOSIS — F329 Major depressive disorder, single episode, unspecified: Secondary | ICD-10-CM | POA: Diagnosis present

## 2019-01-19 DIAGNOSIS — F1190 Opioid use, unspecified, uncomplicated: Secondary | ICD-10-CM | POA: Diagnosis present

## 2019-01-19 DIAGNOSIS — A4189 Other specified sepsis: Principal | ICD-10-CM | POA: Diagnosis present

## 2019-01-19 DIAGNOSIS — E785 Hyperlipidemia, unspecified: Secondary | ICD-10-CM | POA: Diagnosis not present

## 2019-01-19 DIAGNOSIS — K219 Gastro-esophageal reflux disease without esophagitis: Secondary | ICD-10-CM | POA: Diagnosis present

## 2019-01-19 DIAGNOSIS — T17890A Other foreign object in other parts of respiratory tract causing asphyxiation, initial encounter: Secondary | ICD-10-CM | POA: Diagnosis present

## 2019-01-19 DIAGNOSIS — U071 COVID-19: Secondary | ICD-10-CM | POA: Diagnosis present

## 2019-01-19 DIAGNOSIS — Z96652 Presence of left artificial knee joint: Secondary | ICD-10-CM | POA: Diagnosis present

## 2019-01-19 DIAGNOSIS — I119 Hypertensive heart disease without heart failure: Secondary | ICD-10-CM | POA: Diagnosis present

## 2019-01-19 DIAGNOSIS — G35 Multiple sclerosis: Secondary | ICD-10-CM | POA: Diagnosis present

## 2019-01-19 DIAGNOSIS — E871 Hypo-osmolality and hyponatremia: Secondary | ICD-10-CM | POA: Diagnosis present

## 2019-01-19 DIAGNOSIS — J1282 Pneumonia due to coronavirus disease 2019: Secondary | ICD-10-CM | POA: Diagnosis present

## 2019-01-19 DIAGNOSIS — E860 Dehydration: Secondary | ICD-10-CM | POA: Diagnosis present

## 2019-01-19 DIAGNOSIS — J9601 Acute respiratory failure with hypoxia: Secondary | ICD-10-CM | POA: Diagnosis present

## 2019-01-19 DIAGNOSIS — E872 Acidosis: Secondary | ICD-10-CM | POA: Diagnosis present

## 2019-01-19 DIAGNOSIS — N3000 Acute cystitis without hematuria: Secondary | ICD-10-CM | POA: Diagnosis present

## 2019-01-19 DIAGNOSIS — J9621 Acute and chronic respiratory failure with hypoxia: Secondary | ICD-10-CM | POA: Diagnosis present

## 2019-01-19 DIAGNOSIS — F419 Anxiety disorder, unspecified: Secondary | ICD-10-CM | POA: Diagnosis present

## 2019-01-19 DIAGNOSIS — Z66 Do not resuscitate: Secondary | ICD-10-CM | POA: Diagnosis present

## 2019-01-19 DIAGNOSIS — N179 Acute kidney failure, unspecified: Secondary | ICD-10-CM | POA: Diagnosis present

## 2019-01-19 DIAGNOSIS — M069 Rheumatoid arthritis, unspecified: Secondary | ICD-10-CM | POA: Diagnosis present

## 2019-01-19 DIAGNOSIS — I248 Other forms of acute ischemic heart disease: Secondary | ICD-10-CM | POA: Diagnosis present

## 2019-01-19 DIAGNOSIS — I249 Acute ischemic heart disease, unspecified: Secondary | ICD-10-CM | POA: Diagnosis not present

## 2019-01-19 DIAGNOSIS — A419 Sepsis, unspecified organism: Secondary | ICD-10-CM | POA: Diagnosis not present

## 2019-01-19 DIAGNOSIS — R6521 Severe sepsis with septic shock: Secondary | ICD-10-CM | POA: Diagnosis not present

## 2019-01-19 DIAGNOSIS — R6520 Severe sepsis without septic shock: Secondary | ICD-10-CM | POA: Diagnosis not present

## 2019-01-19 DIAGNOSIS — Z888 Allergy status to other drugs, medicaments and biological substances status: Secondary | ICD-10-CM

## 2019-01-19 DIAGNOSIS — Z794 Long term (current) use of insulin: Secondary | ICD-10-CM

## 2019-01-19 DIAGNOSIS — E119 Type 2 diabetes mellitus without complications: Secondary | ICD-10-CM | POA: Diagnosis present

## 2019-01-19 DIAGNOSIS — J9600 Acute respiratory failure, unspecified whether with hypoxia or hypercapnia: Secondary | ICD-10-CM | POA: Diagnosis not present

## 2019-01-19 DIAGNOSIS — Z885 Allergy status to narcotic agent status: Secondary | ICD-10-CM

## 2019-01-19 DIAGNOSIS — Z79899 Other long term (current) drug therapy: Secondary | ICD-10-CM

## 2019-01-19 DIAGNOSIS — Z7982 Long term (current) use of aspirin: Secondary | ICD-10-CM

## 2019-01-19 DIAGNOSIS — Z89422 Acquired absence of other left toe(s): Secondary | ICD-10-CM

## 2019-01-19 DIAGNOSIS — G9340 Encephalopathy, unspecified: Secondary | ICD-10-CM | POA: Diagnosis not present

## 2019-01-19 DIAGNOSIS — R1310 Dysphagia, unspecified: Secondary | ICD-10-CM | POA: Diagnosis present

## 2019-01-19 DIAGNOSIS — R451 Restlessness and agitation: Secondary | ICD-10-CM | POA: Diagnosis present

## 2019-01-19 DIAGNOSIS — Z825 Family history of asthma and other chronic lower respiratory diseases: Secondary | ICD-10-CM

## 2019-01-19 DIAGNOSIS — Z90710 Acquired absence of both cervix and uterus: Secondary | ICD-10-CM

## 2019-01-19 DIAGNOSIS — G9341 Metabolic encephalopathy: Secondary | ICD-10-CM | POA: Diagnosis present

## 2019-01-19 DIAGNOSIS — E1169 Type 2 diabetes mellitus with other specified complication: Secondary | ICD-10-CM | POA: Diagnosis not present

## 2019-01-19 LAB — TROPONIN I (HIGH SENSITIVITY)
Troponin I (High Sensitivity): 94 ng/L — ABNORMAL HIGH (ref <18)
Troponin I (High Sensitivity): 412 ng/L (ref <18)
Troponin I (High Sensitivity): 861 ng/L (ref <18)

## 2019-01-19 LAB — COMPREHENSIVE METABOLIC PANEL
ALT: 17 U/L (ref 0–44)
AST: 36 U/L (ref 15–41)
Albumin: 2.9 g/dL — ABNORMAL LOW (ref 3.5–5.0)
Alkaline Phosphatase: 83 U/L (ref 38–126)
Anion gap: 13 (ref 5–15)
BUN: 35 mg/dL — ABNORMAL HIGH (ref 8–23)
CO2: 27 mmol/L (ref 22–32)
Calcium: 9.1 mg/dL (ref 8.9–10.3)
Chloride: 93 mmol/L — ABNORMAL LOW (ref 98–111)
Creatinine, Ser: 1.03 mg/dL — ABNORMAL HIGH (ref 0.44–1.00)
GFR calc Af Amer: 58 mL/min — ABNORMAL LOW (ref >60)
GFR calc non Af Amer: 50 mL/min — ABNORMAL LOW (ref >60)
Glucose, Bld: 310 mg/dL — ABNORMAL HIGH (ref 70–99)
Potassium: 4.9 mmol/L (ref 3.5–5.1)
Sodium: 133 mmol/L — ABNORMAL LOW (ref 135–145)
Total Bilirubin: 0.5 mg/dL (ref 0.3–1.2)
Total Protein: 8.3 g/dL — ABNORMAL HIGH (ref 6.5–8.1)

## 2019-01-19 LAB — CBC
HCT: 37.4 % (ref 36.0–46.0)
Hemoglobin: 11.7 g/dL — ABNORMAL LOW (ref 12.0–15.0)
MCH: 31.3 pg (ref 26.0–34.0)
MCHC: 31.3 g/dL (ref 30.0–36.0)
MCV: 100 fL (ref 80.0–100.0)
Platelets: 198 10*3/uL (ref 150–400)
RBC: 3.74 MIL/uL — ABNORMAL LOW (ref 3.87–5.11)
RDW: 14.3 % (ref 11.5–15.5)
WBC: 7.7 10*3/uL (ref 4.0–10.5)
nRBC: 0 % (ref 0.0–0.2)

## 2019-01-19 LAB — GLUCOSE, CAPILLARY
Glucose-Capillary: 182 mg/dL — ABNORMAL HIGH (ref 70–99)
Glucose-Capillary: 240 mg/dL — ABNORMAL HIGH (ref 70–99)
Glucose-Capillary: 252 mg/dL — ABNORMAL HIGH (ref 70–99)

## 2019-01-19 LAB — URINALYSIS, ROUTINE W REFLEX MICROSCOPIC
Bilirubin Urine: NEGATIVE
Glucose, UA: NEGATIVE mg/dL
Hgb urine dipstick: NEGATIVE
Ketones, ur: 5 mg/dL — AB
Nitrite: POSITIVE — AB
Protein, ur: 100 mg/dL — AB
Specific Gravity, Urine: 1.016 (ref 1.005–1.030)
WBC, UA: >50 WBC/hpf — ABNORMAL HIGH (ref 0–5)
pH: 5 (ref 5.0–8.0)

## 2019-01-19 LAB — FIBRINOGEN: Fibrinogen: 673 mg/dL — ABNORMAL HIGH (ref 210–475)

## 2019-01-19 LAB — BLOOD GAS, VENOUS
Acid-base deficit: 3.2 mmol/L — ABNORMAL HIGH (ref 0.0–2.0)
Bicarbonate: 24.7 mmol/L (ref 20.0–28.0)
O2 Saturation: 85.6 %
Patient temperature: 37
pCO2, Ven: 55 mmHg (ref 44.0–60.0)
pH, Ven: 7.26 (ref 7.250–7.430)
pO2, Ven: 59 mmHg — ABNORMAL HIGH (ref 32.0–45.0)

## 2019-01-19 LAB — C-REACTIVE PROTEIN: CRP: 5.8 mg/dL — ABNORMAL HIGH (ref <1.0)

## 2019-01-19 LAB — APTT: aPTT: 31 seconds (ref 24–36)

## 2019-01-19 LAB — POC SARS CORONAVIRUS 2 AG: SARS Coronavirus 2 Ag: POSITIVE — AB

## 2019-01-19 LAB — FERRITIN: Ferritin: 536 ng/mL — ABNORMAL HIGH (ref 11–307)

## 2019-01-19 LAB — D-DIMER, QUANTITATIVE: D-Dimer, Quant: 1.38 ug/mL-FEU — ABNORMAL HIGH (ref 0.00–0.50)

## 2019-01-19 LAB — PROTIME-INR
INR: 1 (ref 0.8–1.2)
Prothrombin Time: 12.9 seconds (ref 11.4–15.2)

## 2019-01-19 LAB — LACTIC ACID, PLASMA
Lactic Acid, Venous: 2.9 mmol/L (ref 0.5–1.9)
Lactic Acid, Venous: 2.4 mmol/L (ref 0.5–1.9)
Lactic Acid, Venous: 2.4 mmol/L (ref 0.5–1.9)

## 2019-01-19 LAB — BRAIN NATRIURETIC PEPTIDE: B Natriuretic Peptide: 1232 pg/mL — ABNORMAL HIGH (ref 0.0–100.0)

## 2019-01-19 LAB — PROCALCITONIN: Procalcitonin: <0.1 ng/mL

## 2019-01-19 MED ORDER — METHADONE HCL 5 MG PO TABS
5.0000 mg | ORAL_TABLET | Freq: Four times a day (QID) | ORAL | Status: DC
  Administered 2019-01-20 (×2): 5 mg via ORAL
  Filled 2019-01-19 (×2): qty 1

## 2019-01-19 MED ORDER — LACTATED RINGERS BOLUS PEDS
1000.0000 mL | Freq: Once | INTRAVENOUS | Status: AC
  Administered 2019-01-19: 1000 mL via INTRAVENOUS

## 2019-01-19 MED ORDER — VITAMIN C 500 MG/5ML PO SYRP
500.0000 mg | ORAL_SOLUTION | Freq: Every day | ORAL | Status: DC
  Filled 2019-01-19 (×2): qty 5

## 2019-01-19 MED ORDER — SODIUM CHLORIDE 0.9 % IV SOLN: 1.0000 g | INTRAVENOUS | Status: DC

## 2019-01-19 MED ORDER — TRAMADOL HCL 50 MG PO TABS
50.0000 mg | ORAL_TABLET | Freq: Four times a day (QID) | ORAL | Status: DC | PRN
  Filled 2019-01-19: qty 1

## 2019-01-19 MED ORDER — ZINC SULFATE 220 (50 ZN) MG PO CAPS
220.0000 mg | ORAL_CAPSULE | Freq: Every day | ORAL | Status: DC
  Administered 2019-01-19: 220 mg via ORAL
  Filled 2019-01-19 (×2): qty 1

## 2019-01-19 MED ORDER — METRONIDAZOLE IN NACL 5-0.79 MG/ML-% IV SOLN
500.0000 mg | Freq: Once | INTRAVENOUS | Status: AC
  Administered 2019-01-19: 500 mg via INTRAVENOUS
  Filled 2019-01-19: qty 100

## 2019-01-19 MED ORDER — INSULIN ASPART 100 UNIT/ML ~~LOC~~ SOLN
0.0000 [IU] | SUBCUTANEOUS | Status: DC
  Administered 2019-01-19 – 2019-01-20 (×2): 2 [IU] via SUBCUTANEOUS
  Administered 2019-01-20: 1 [IU] via SUBCUTANEOUS
  Administered 2019-01-20 (×3): 2 [IU] via SUBCUTANEOUS
  Administered 2019-01-20: 3 [IU] via SUBCUTANEOUS
  Filled 2019-01-19 (×7): qty 1

## 2019-01-19 MED ORDER — VANCOMYCIN HCL IN DEXTROSE 1-5 GM/200ML-% IV SOLN
1000.0000 mg | Freq: Once | INTRAVENOUS | Status: AC
  Administered 2019-01-19: 1000 mg via INTRAVENOUS
  Filled 2019-01-19: qty 200

## 2019-01-19 MED ORDER — SODIUM CHLORIDE 0.9 % IV SOLN: INTRAVENOUS | Status: DC

## 2019-01-19 MED ORDER — ACETAMINOPHEN 325 MG PO TABS: 650.0000 mg | ORAL_TABLET | Freq: Four times a day (QID) | ORAL | Status: DC | PRN

## 2019-01-19 MED ORDER — SODIUM CHLORIDE 0.9 % IV SOLN
100.0000 mg | Freq: Every day | INTRAVENOUS | Status: DC
  Administered 2019-01-20: 10:00:00 100 mg via INTRAVENOUS
  Filled 2019-01-19: qty 100
  Filled 2019-01-19: qty 20

## 2019-01-19 MED ORDER — ASPIRIN 81 MG PO CHEW
325.0000 mg | CHEWABLE_TABLET | Freq: Once | ORAL | Status: AC
  Administered 2019-01-20: 325 mg via ORAL
  Filled 2019-01-19: qty 5

## 2019-01-19 MED ORDER — SODIUM CHLORIDE 0.9 % IV SOLN
200.0000 mg | Freq: Once | INTRAVENOUS | Status: AC
  Administered 2019-01-19: 200 mg via INTRAVENOUS
  Filled 2019-01-19: qty 200

## 2019-01-19 MED ORDER — DEXAMETHASONE SODIUM PHOSPHATE 10 MG/ML IJ SOLN
6.0000 mg | INTRAMUSCULAR | Status: DC
  Administered 2019-01-20: 6 mg via INTRAVENOUS
  Filled 2019-01-19: qty 1

## 2019-01-19 MED ORDER — ALBUTEROL SULFATE (2.5 MG/3ML) 0.083% IN NEBU: 3.0000 mL | INHALATION_SOLUTION | RESPIRATORY_TRACT | Status: DC | PRN

## 2019-01-19 MED ORDER — SODIUM CHLORIDE 0.9 % IV BOLUS
1000.0000 mL | Freq: Once | INTRAVENOUS | Status: AC
  Administered 2019-01-19: 1000 mL via INTRAVENOUS

## 2019-01-19 MED ORDER — DEXAMETHASONE SODIUM PHOSPHATE 10 MG/ML IJ SOLN
10.0000 mg | Freq: Once | INTRAMUSCULAR | Status: AC
  Administered 2019-01-19: 17:00:00 10 mg via INTRAVENOUS
  Filled 2019-01-19: qty 1

## 2019-01-19 MED ORDER — IOHEXOL 350 MG/ML SOLN
75.0000 mL | Freq: Once | INTRAVENOUS | Status: AC | PRN
  Administered 2019-01-19: 75 mL via INTRAVENOUS

## 2019-01-19 MED ORDER — FUROSEMIDE 10 MG/ML IJ SOLN
40.0000 mg | Freq: Once | INTRAMUSCULAR | Status: AC
  Administered 2019-01-20: 40 mg via INTRAVENOUS
  Filled 2019-01-19: qty 4

## 2019-01-19 MED ORDER — SODIUM CHLORIDE 0.9 % IV SOLN
2.0000 g | Freq: Once | INTRAVENOUS | Status: AC
  Administered 2019-01-19: 2 g via INTRAVENOUS
  Filled 2019-01-19: qty 2

## 2019-01-19 MED ORDER — ONDANSETRON HCL 4 MG/2ML IJ SOLN: 4.0000 mg | Freq: Four times a day (QID) | INTRAMUSCULAR | Status: DC | PRN

## 2019-01-19 MED ORDER — ACETAMINOPHEN 650 MG RE SUPP: 650.0000 mg | Freq: Four times a day (QID) | RECTAL | Status: DC | PRN

## 2019-01-19 MED ORDER — ONDANSETRON HCL 4 MG PO TABS: 4.0000 mg | ORAL_TABLET | Freq: Four times a day (QID) | ORAL | Status: DC | PRN

## 2019-01-19 MED ORDER — BISACODYL 5 MG PO TBEC: 5.0000 mg | DELAYED_RELEASE_TABLET | Freq: Every day | ORAL | Status: DC | PRN

## 2019-01-19 MED ORDER — LACTATED RINGERS IV BOLUS
1000.0000 mL | Freq: Once | INTRAVENOUS | Status: AC
  Administered 2019-01-19: 1000 mL via INTRAVENOUS

## 2019-01-19 MED ORDER — ENOXAPARIN SODIUM 40 MG/0.4ML ~~LOC~~ SOLN
40.0000 mg | SUBCUTANEOUS | Status: DC
  Filled 2019-01-19: qty 0.4

## 2019-01-19 NOTE — ED Provider Notes (Signed)
[facility] Emergency Department Provider Note   ____________________________________________   First MD Initiated Contact with Patient 02/04/2019 1500     (approximate)  I have reviewed the triage vital signs and the nursing notes.   HISTORY  Chief Complaint Shortness of Breath and Chest Pain    HPI Stefanie Pham is a 86 y.o. female with possible history of MS, hypertension, diabetes who presents to the ED for fever and altered mental status.  History is limited secondary to patient's altered mental status, daughter reports that she has been much less communicative at home over the past 3 to 4 days.  Daughter is also noted a fever as high as 101.8 two days ago, which she has been treating with Tylenol.  Patient has had a very poor appetite and family has been trying to force her to eat over the past couple of days but she simply holds the food in her mouth and does not swallow.  Daughter does state that she has had difficulty swallowing the past and has been coughing on occasion recently when attempting to swallow.  She has also appeared to have some difficulty breathing, but patient has denied any pain.  Patient again denies pain to me today, specifically denies chest pain or dysuria.  Daughter is not aware of any sick contacts.        Past Medical History:  Diagnosis Date  . Diabetes mellitus without complication ([place])   . Diverticulitis   . Hernia, inguinal   . Hypertension   . Multiple sclerosis [facility])     Patient Active Problem List   Diagnosis Date Noted  . COVID-19 02/05/2019  . Sepsis without acute organ dysfunction ([place])   . CAP (community acquired pneumonia) 04/11/2018  . Osteomyelitis ([place]) 03/11/2018  . Hyponatremia 07/01/2014    Past Surgical History:  Procedure Laterality Date  . ABDOMINAL HYSTERECTOMY    . AMPUTATION TOE Left 03/11/2018   Procedure: AMPUTATION TOE LEFT GREAT TOE;  Surgeon: Sharlotte Alamo, DPM;  Location: [facility] ORS;   Service: Podiatry;  Laterality: Left;  . APPENDECTOMY    . CERVICAL SPINE SURGERY N/A   . CHOLECYSTECTOMY    . HERNIA REPAIR    . ORIF ANKLE FRACTURE Left   . REPLACEMENT TOTAL KNEE Left 2    Prior to Admission medications   Medication Sig Start Date End Date Taking? Authorizing Provider  amoxicillin-clavulanate (AUGMENTIN) 500-125 MG tablet Take 1 tablet by mouth 2 (two) times daily.   Yes [provider]  Ascorbic Acid 125 MG CHEW Chew 125 mg by mouth daily.   Yes [provider]  benazepril (LOTENSIN) 40 MG tablet Take 40 mg by mouth daily.   Yes [provider]  Cholecalciferol (HM VITAMIN D3) 4000 UNITS CAPS Take 1 capsule by mouth at bedtime.   Yes [provider]  diphenhydrAMINE (BENADRYL) 25 mg capsule Take 50 mg by mouth at bedtime as needed for sleep.   Yes [provider]  docusate sodium (COLACE) 100 MG capsule Take 100-200 mg by mouth 2 (two) times daily as needed for mild constipation.   Yes [provider]  DULoxetine (CYMBALTA) 30 MG capsule Take 30 mg by mouth daily.   Yes [provider]  ferrous gluconate (FERGON) 324 MG tablet Take 1 tablet by mouth daily. 01/21/18  Yes [provider]  gabapentin (NEURONTIN) 300 MG capsule Take 400 mg by mouth 5 (five) times daily.  04/10/18  Yes [provider]  glimepiride (  AMARYL) 4 MG tablet Take 4 mg by mouth daily. 03/09/18 03/09/19 Yes [provider]  Insulin Glargine (LANTUS SOLOSTAR) 100 UNIT/ML Solostar Pen Inject 10 Units into the skin Nightly. 01/19/18 01/31/2019 Yes [provider]  Lactobacillus Acid-Pectin (EQL PROBIOTIC ACIDOPHILUS) CAPS Take 1 capsule by mouth daily.   Yes [provider]  methadone (DOLOPHINE) 5 MG tablet Take 5 mg by mouth every 6 (six) hours.   Yes [provider]  Multiple Vitamins-Minerals (MULTIVITAMIN PO) Take 2 each by mouth at bedtime.   Yes [provider]  omeprazole (PRILOSEC)  20 MG capsule Take 20 mg by mouth daily.   Yes [provider]  prochlorperazine (COMPAZINE) 10 MG tablet Take 10 mg by mouth every 6 (six) hours as needed for nausea or vomiting.   Yes [provider]  promethazine (PHENERGAN) 12.5 MG tablet Take 12.5 mg by mouth every 6 (six) hours as needed for nausea or vomiting.   Yes [provider]  Pyridoxine HCl (VITAMIN B-6 PO) Take 1 tablet by mouth at bedtime.   Yes [provider]  aspirin EC 81 MG tablet Take 81 mg by mouth at bedtime.    [provider]  Bioflavonoid Products (VITAMIN C) CHEW Chew 1 each by mouth at bedtime.    [provider]  Calcium Carbonate-Vitamin D (CALCIUM 500/D PO) Take 1 tablet by mouth daily.    [provider]  interferon beta-1a (AVONEX) 30 MCG injection Inject 30 mcg into the muscle once a week.    [provider]  magnesium gluconate (MAGONATE) 500 MG tablet Take 500 mg by mouth daily.    [provider]  mometasone (NASONEX) 50 MCG/ACT nasal spray Place 2 sprays into the nose at bedtime.    [provider]  NARCAN 4 MG/0.1ML LIQD nasal spray kit Place 4 mg into the nose once. 10/30/17   [provider]  silver sulfADIAZINE (SILVADENE) 1 % cream Apply 1 application topically 2 (two) times daily. 01/15/18   [provider]    Allergies Hydralazine hcl, Morphine and related, and Codeine  Family History  Problem Relation Age of Onset  . COPD Father     Social History Social History   Tobacco Use  . Smoking status: Never Smoker  . Smokeless tobacco: Never Used  Substance Use Topics  . Alcohol use: No  . Drug use: No    Review of Systems Unable to obtain secondary to altered mental status ____________________________________________   PHYSICAL EXAM:  VITAL SIGNS: ED Triage Vitals  Enc Vitals Group     BP 02/11/2019 1408 (!) 151/83     Pulse Rate 02/01/2019 1408 (!) 125     Resp 02/01/2019 1408 (!)  26     Temp --      Temp src --      SpO2 01/24/2019 1408 (!) 89 %     Weight 01/15/2019 1436 185 lb (83.9 kg)     Height 02/01/2019 1436 '5\' 6"'$  (1.676 m)     Head Circumference --      Peak Flow --      Pain Score --      Pain Loc --      Pain Edu? --      Excl. in [place]? --     Constitutional: Awake and alert Eyes: Conjunctivae are normal. Head: Atraumatic. Nose: No congestion/rhinnorhea. Mouth/Throat: Mucous membranes are dry. Neck: Normal ROM Cardiovascular: Tachycardic, regular rhythm. Grossly normal heart sounds. Respiratory: Tachypneic with  increased respiratory effort.  No retractions. Lungs CTAB. Gastrointestinal: Soft and nontender. No distention. Genitourinary: deferred Musculoskeletal: No lower extremity tenderness nor edema. Neurologic: No gross focal neurologic deficits are appreciated. Skin:  Skin is warm, dry and intact. No rash noted.  Chronic wounds to bilateral toes with no signs of acute infection. Psychiatric: Mood and affect are normal. Speech and behavior are normal.  ____________________________________________   LABS (all labs ordered are listed, but only abnormal results are displayed)  Labs Reviewed  CBC - Abnormal; Notable for the following components:      Result Value   RBC 3.74 (*)    Hemoglobin 11.7 (*)    All other components within normal limits  COMPREHENSIVE METABOLIC PANEL - Abnormal; Notable for the following components:   Sodium 133 (*)    Chloride 93 (*)    Glucose, Bld 310 (*)    BUN 35 (*)    Creatinine, Ser 1.03 (*)    Total Protein 8.3 (*)    Albumin 2.9 (*)    GFR calc non Af Amer 50 (*)    GFR calc Af Amer 58 (*)    All other components within normal limits  FIBRINOGEN - Abnormal; Notable for the following components:   Fibrinogen 673 (*)    All other components within normal limits  FERRITIN - Abnormal; Notable for the following components:   Ferritin 536 (*)    All other components within normal limits  C-REACTIVE PROTEIN  - Abnormal; Notable for the following components:   CRP 5.8 (*)    All other components within normal limits  LACTIC ACID, PLASMA - Abnormal; Notable for the following components:   Lactic Acid, Venous 2.9 (*)    All other components within normal limits  LACTIC ACID, PLASMA - Abnormal; Notable for the following components:   Lactic Acid, Venous 2.4 (*)    All other components within normal limits  URINALYSIS, ROUTINE W REFLEX MICROSCOPIC - Abnormal; Notable for the following components:   Color, Urine YELLOW (*)    APPearance CLOUDY (*)    Ketones, ur 5 (*)    Protein, ur 100 (*)    Nitrite POSITIVE (*)    Leukocytes,Ua LARGE (*)    WBC, UA >50 (*)    Bacteria, UA MANY (*)    All other components within normal limits  D-DIMER, QUANTITATIVE (NOT AT [facility]) - Abnormal; Notable for the following components:   D-Dimer, Quant 1.38 (*)    All other components within normal limits  LACTIC ACID, PLASMA - Abnormal; Notable for the following components:   Lactic Acid, Venous 2.4 (*)    All other components within normal limits  GLUCOSE, CAPILLARY - Abnormal; Notable for the following components:   Glucose-Capillary 182 (*)    All other components within normal limits  GLUCOSE, CAPILLARY - Abnormal; Notable for the following components:   Glucose-Capillary 240 (*)    All other components within normal limits  BLOOD GAS, VENOUS - Abnormal; Notable for the following components:   pO2, Ven 59.0 (*)    Acid-base deficit 3.2 (*)    All other components within normal limits  BRAIN NATRIURETIC PEPTIDE - Abnormal; Notable for the following components:   B Natriuretic Peptide 1,232.0 (*)    All other components within normal limits  GLUCOSE, CAPILLARY - Abnormal; Notable for the following components:   Glucose-Capillary 252 (*)    All other components within normal limits  POC SARS CORONAVIRUS 2 AG - Abnormal; Notable for the  following components:   SARS Coronavirus 2 Ag POSITIVE (*)    All  other components within normal limits  TROPONIN I (HIGH SENSITIVITY) - Abnormal; Notable for the following components:   Troponin I (High Sensitivity) 94 (*)    All other components within normal limits  TROPONIN I (HIGH SENSITIVITY) - Abnormal; Notable for the following components:   Troponin I (High Sensitivity) 412 (*)    All other components within normal limits  TROPONIN I (HIGH SENSITIVITY) - Abnormal; Notable for the following components:   Troponin I (High Sensitivity) 861 (*)    All other components within normal limits  CULTURE, BLOOD (ROUTINE X 2)  CULTURE, BLOOD (ROUTINE X 2)  URINE CULTURE  APTT  PROTIME-INR  PROCALCITONIN  CBC  BASIC METABOLIC PANEL  POC SARS CORONAVIRUS 2 AG -  ED   ____________________________________________  EKG  ED ECG REPORT I, Blake Divine, the attending physician, personally viewed and interpreted this ECG.   Date: 02/07/2019  EKG Time: 14:16  Rate: 129  Rhythm: sinus tachycardia  Axis: Normal  Intervals:right bundle branch block  ST&T Change: None  PROCEDURES  Procedure(s) performed (including Critical Care):  .Critical Care Performed by: Blake Divine, MD Authorized by: Blake Divine, MD   Critical care provider statement:    Critical care time (minutes):  45   Critical care time was exclusive of:  Separately billable procedures and treating other patients and teaching time   Critical care was necessary to treat or prevent imminent or life-threatening deterioration of the following conditions:  Respiratory failure   Critical care was time spent personally by me on the following activities:  Discussions with consultants, evaluation of patient's response to treatment, examination of patient, ordering and performing treatments and interventions, ordering and review of laboratory studies, ordering and review of radiographic studies, pulse oximetry, re-evaluation of patient's condition, obtaining history from patient or  surrogate and review of old charts   I assumed direction of critical care for this patient from another provider in my specialty: no       ____________________________________________   INITIAL IMPRESSION / ASSESSMENT AND PLAN / ED COURSE       86 year old female presents to the ED for fever and altered mental status over the past couple of days, has appeared to have difficulty breathing at home but has not complained of any pain.  She again denies any pain here in the ED but is tachycardic, tachypneic, and seems to be having some difficulty breathing.  O2 sats were noted to be in the 80s on room air and she was subsequently placed on 4 L nasal cannula with improvement.  She is awake and alert with no focal neurologic deficits, doubt intracranial process and infectious etiology seems most likely.  She meets criteria for sepsis and code sepsis initiated, given low blood pressure and appearance of dehydration, will hydrate with 2 L IV fluid bolus and reevaluate.  Plan to start broad-spectrum antibiotics as no obvious source, although COVID-19 is a strong possibility.  Testing for COVID-19 is positive, will give a dose of steroids given her oxygen requirement.  UA is also significant for UTI and patient appears to have a component of sepsis in addition to COVID-19.  She has been treated with broad-spectrum antibiotics and blood pressure appears improved after third liter of IV fluids.  Her troponin is elevated, although I suspect this is secondary to demand and her sepsis rather than ACS as she denies any chest pain and EKG is unremarkable.  Case discussed with hospitalist, who agrees with plan to hold off on heparin for now.      ____________________________________________   FINAL CLINICAL IMPRESSION(S) / ED DIAGNOSES  Final diagnoses:  Sepsis without acute organ dysfunction, due to unspecified organism [facility])  Acute cystitis without hematuria  Pneumonia due to COVID-19 virus     ED  Discharge Orders    None       Note:  This document was prepared using Dragon voice recognition software and may include unintentional dictation errors.   Blake Divine, MD 01/21/2019 [phone]

## 2019-01-19 NOTE — ED Notes (Signed)
Date and time results received: 01/31/2019 Stefanie Pham [id number] (use smartphrase ".now" to insert current time)  Test: Troponin, lactic Critical Value: 412, 2.4  Name of Provider Notified: Larinda Buttery, MD  Orders Received? Or Actions Taken?: Repeat EKG

## 2019-01-19 NOTE — ED Notes (Signed)
Pt resting on stretcher with lights off and sitter at the bedside. Eyes closed. Continued increased work of breathing noted at this time.

## 2019-01-19 NOTE — ED Notes (Addendum)
Pt with bp 75/47 - Dr. Larinda Buttery notified and to bedside

## 2019-01-19 NOTE — ED Notes (Signed)
Pt resting on stretcher with iv fluids infusing without difficulty. Denies pain. Wet sounding cough present with slight increased work of breathing noted at this time. purewick in place. Provided for comfort and safety and will continue to assess.

## 2019-01-19 NOTE — Progress Notes (Addendum)
Nurse calls reports patient more restless and some increased work of breathing. Also more "confused from baseline"  Currently receiving third IV fluid bolus.  Bolus was stopped and BNP ordered. Venous blood gas normal limits. Review of chart shows patient diagnosed with anxiety disorder.  Also hospitalized 08/2018 sepsis/bactermia RLL cellulitis. AT that time BAL showed lower airway yeast colonization and CXR without clear infiltrate, had elevation of R hemidiaphragm with  Chest CT shows subtle centrilobular micro nodules in RML likely due to mucoid impact but no pneumonia.  She echo with normal LV function and EF   Review of patient chart, patient is on methadone and gabapentin for her MS related pain. - confirmed with daughter by /rn and daughter reports patient becomes very restless and agitated without these meds.. Methadone has been resumed for fear of complications from withdrawal.  Holding on restart on neurontin for now.  Also noted patient with troponin from 94 to 412. Repeat EKG not amenable ST with PVC;s RBBB not new.  Previous EKG with minimal ST elevation in inferior leads however, current EKG with poor inferior lead tracing .  Third troponin  861, BNP 1232.  CTA chest IMPRESSION: 1. Limited study due to respiratory motion artifact. No definite CT evidence of central pulmonary artery embolus. 2. Bilateral lower lobe patchy and streaky airspace opacities most consistent with multifocal pneumonia. Clinical correlation and follow-up to resolution recommended. 3. Moderate cardiomegaly with evidence of right heart dysfunction. 4. Mucous impaction of several right lower lobe bronchi. Aspiration not excluded. 5. Aortic AtherosclerosisIMPRESSION:  She has been given ASA, started on heparin protocol and cardiology has been consulted BB therapy started and echo ordered  Discussed current status of patient with her daughter who is POA. She  Agrees with current plan Discussed code status with  POA as well and likelihood of survival should her mom have a cardiopulmonary arrest.  She willl discuss with other siblings. Patient remains a full code  With continue elevation of troponin levels, Dr. Juliann Pares with cardiology called and discussed case. He believes it is infection related and has no further recommendation at this time other than heparin, BB, and echo.   With her work of breathing but relative stable oxygen saturations and blood gas, I have ordered chest PT, and NP suctioning for RLL mucous plugging.  On bedside exam she does have significantly decreased breathsounds in RLL and crackles throughout left.  Placed consult for pulmonology as she will likely need bronch.  Harlon Ditty NP informed of consult

## 2019-01-19 NOTE — ED Triage Notes (Signed)
FIRST NURSE NOTE- called for SHOB, sats in 80s RA.  6 L [place] sats 96%.  Pt not leaving oxygen in. Confused.  Hx MS.  CBG 343 with EMS

## 2019-01-19 NOTE — ED Notes (Addendum)
Pt remains restless on stretcher. Increased work of breathing continues. Admitting MD aware. Fluids paused.

## 2019-01-19 NOTE — H&P (Addendum)
History and Physical    Stefanie Pham INO:[id number] DOB: [birth year] DOA: 02/06/2019  PCP: Clarisse Gouge, MD  Patient coming from: home    Chief Complaint: shortness of breath  HPI: 86 y/o F w/ PMH of MS, DM2, HTN, depression, GERD who presents w/ shortness of breath and altered mental status. Pt is lethargic and oriented to person only. Pt states she has been having shortness of breath for "a long while." Pt is unable to provide any further meaningful hx.   Pt was found to be COVID19 positive and have acute hypoxic respiratory failure in the ER. Pt is requiring supplemental oxygen, currently 4L . Pt was also found to have likely UTI w/ positive UA.   A 10 point ROS was unable to be completed secondary to pt's altered mental status.      Past Medical History:  Diagnosis Date  . Diabetes mellitus without complication ([place])   . Diverticulitis   . Hernia, inguinal   . Hypertension   . Multiple sclerosis ([place])     Past Surgical History:  Procedure Laterality Date  . ABDOMINAL HYSTERECTOMY    . AMPUTATION TOE Left 03/11/2018   Procedure: AMPUTATION TOE LEFT GREAT TOE;  Surgeon: Sharlotte Alamo, DPM;  Location: [facility] ORS;  Service: Podiatry;  Laterality: Left;  . APPENDECTOMY    . CERVICAL SPINE SURGERY N/A   . CHOLECYSTECTOMY    . HERNIA REPAIR    . ORIF ANKLE FRACTURE Left   . REPLACEMENT TOTAL KNEE Left 2     reports that she has never smoked. She has never used smokeless tobacco. She reports that she does not drink alcohol or use drugs.  Allergies  Allergen Reactions  . Hydralazine Hcl Other (See Comments)    Severe hyponatremia  . Morphine And Related Shortness Of Breath  . Codeine Nausea And Vomiting    Family History  Problem Relation Age of Onset  . COPD Father      Prior to Admission medications   Medication Sig Start Date End Date Taking? Authorizing Provider  amoxicillin-clavulanate (AUGMENTIN) 500-125 MG tablet Take 1 tablet by mouth 2 (two) times  daily.   Yes [provider]  Ascorbic Acid 125 MG CHEW Chew 125 mg by mouth daily.   Yes [provider]  benazepril (LOTENSIN) 40 MG tablet Take 40 mg by mouth daily.   Yes [provider]  Cholecalciferol (HM VITAMIN D3) 4000 UNITS CAPS Take 1 capsule by mouth at bedtime.   Yes [provider]  diphenhydrAMINE (BENADRYL) 25 mg capsule Take 50 mg by mouth at bedtime as needed for sleep.   Yes [provider]  docusate sodium (COLACE) 100 MG capsule Take 100-200 mg by mouth 2 (two) times daily as needed for mild constipation.   Yes [provider]  DULoxetine (CYMBALTA) 30 MG capsule Take 30 mg by mouth daily.   Yes [provider]  ferrous gluconate (FERGON) 324 MG tablet Take 1 tablet by mouth daily. 01/21/18  Yes [provider]  gabapentin (NEURONTIN) 300 MG capsule Take 400 mg by mouth 5 (five) times daily.  04/10/18  Yes [provider]  glimepiride (AMARYL) 4 MG tablet Take 4 mg by mouth daily. 03/09/18 03/09/19 Yes [provider]  Insulin Glargine (LANTUS SOLOSTAR) 100 UNIT/ML Solostar Pen Inject 10 Units into the skin Nightly. 01/19/18 01/18/2019 Yes [provider]  Lactobacillus Acid-Pectin (EQL PROBIOTIC ACIDOPHILUS) CAPS Take 1 capsule by mouth daily.   Yes [provider]  methadone (DOLOPHINE) 5 MG tablet Take 5 mg by mouth every 6 (six) hours.   Yes [provider]  Multiple Vitamins-Minerals (MULTIVITAMIN PO) Take 2 each by mouth at bedtime.   Yes [provider]  omeprazole (PRILOSEC) 20 MG capsule Take 20 mg by mouth daily.   Yes [provider]  prochlorperazine (COMPAZINE) 10 MG tablet Take 10 mg by mouth every 6 (six) hours as needed for nausea or vomiting.   Yes [provider]  promethazine (PHENERGAN) 12.5 MG tablet Take 12.5 mg by mouth every 6 (six) hours as needed for nausea or vomiting.   Yes [provider]  Pyridoxine HCl  (VITAMIN B-6 PO) Take 1 tablet by mouth at bedtime.   Yes [provider]  aspirin EC 81 MG tablet Take 81 mg by mouth at bedtime.    [provider]  Bioflavonoid Products (VITAMIN C) CHEW Chew 1 each by mouth at bedtime.    [provider]  Calcium Carbonate-Vitamin D (CALCIUM 500/D PO) Take 1 tablet by mouth daily.    [provider]  interferon beta-1a (AVONEX) 30 MCG injection Inject 30 mcg into the muscle once a week.    [provider]  magnesium gluconate (MAGONATE) 500 MG tablet Take 500 mg by mouth daily.    [provider]  mometasone (NASONEX) 50 MCG/ACT nasal spray Place 2 sprays into the nose at bedtime.    [provider]  NARCAN 4 MG/0.1ML LIQD nasal spray kit Place 4 mg into the nose once. 10/30/17   [provider]  silver sulfADIAZINE (SILVADENE) 1 % cream Apply 1 application topically 2 (two) times daily. 01/15/18   [provider]    Physical Exam: Vitals:   01/31/2019 1655 02/14/2019 1700 01/25/2019 1705 01/27/2019 1710  BP: 92/63 (!) 88/59 (!) 75/47 (!) 72/48  Pulse: 72 68 68 73  Resp: (!) 22 (!) 21 (!) 21 20  SpO2: 99% 98% 97% 99%  Weight:      Height:        Constitutional: NAD, calm, comfortable Vitals:   02/14/2019 1655 02/12/2019 1700 02/12/2019 1705 02/14/2019 1710  BP: 92/63 (!) 88/59 (!) 75/47 (!) 72/48  Pulse: 72 68 68 73  Resp: (!) 22 (!) 21 (!) 21 20  SpO2: 99% 98% 97% 99%  Weight:      Height:       Eyes: PERRL, lids and conjunctivae normal ENMT: Mucous membranes are dry. Posterior pharynx clear of any exudate or lesions. Neck: normal, supple,  Respiratory: diminished breath sounds b/l Cardiovascular: S1/S2 +. no rubs / gallops. B/L +1 pitting edema  Abdomen: soft,  abd is obese. no tenderness. Hypoactive Bowel sounds .  Musculoskeletal: no clubbing / cyanosis. No joint deformity upper and lower extremities. Decreased ROM in all directions for b/l UE and b/l LE  Skin: no rashes,  lesions, ulcers. No induration Neurologic: Unable to complete CN exam as pt will not follow directions.  Decreased strength of b/l LE  Psychiatric: abnormal judgment and insight. Lethargic, oriented to person only. Flat mood and affect     Labs on Admission: I have personally reviewed following labs and imaging studies  CBC: Recent Labs  Lab 01/27/2019 1448  WBC 7.7  HGB 11.7*  HCT 37.4  MCV 100.0  PLT 269   Basic Metabolic Panel: Recent Labs  Lab 02/05/2019 1448  NA 133*  K 4.9  CL 93*  CO2 27  GLUCOSE 310*  BUN 35*  CREATININE 1.03*  CALCIUM 9.1   GFR: Estimated Creatinine Clearance: 45.1 mL/min (A) (by C-G formula based on SCr of 1.03 mg/dL (H)). Liver Function Tests: Recent Labs  Lab 01/25/2019 1448  AST 36  ALT 17  ALKPHOS 83  BILITOT 0.5  PROT 8.3*  ALBUMIN 2.9*   No results for input(s): LIPASE, AMYLASE in the last 168 hours. No results for input(s): AMMONIA in the last 168 hours. Coagulation Profile: Recent Labs  Lab 01/29/2019 1520  INR 1.0   Cardiac Enzymes: No results for input(s): CKTOTAL, CKMB, CKMBINDEX, TROPONINI in the last 168 hours. BNP (last 3 results) No results for input(s): PROBNP in the last 8760 hours. HbA1C: No results for input(s): HGBA1C in the last 72 hours. CBG: No results for input(s): GLUCAP in the last 168 hours. Lipid Profile: No results for input(s): CHOL, HDL, LDLCALC, TRIG, CHOLHDL, LDLDIRECT in the last 72 hours. Thyroid Function Tests: No results for input(s): TSH, T4TOTAL, FREET4, T3FREE, THYROIDAB in the last 72 hours. Anemia Panel: Recent Labs    02/07/2019 1448  FERRITIN 536*   Urine analysis:    Component Value Date/Time   COLORURINE YELLOW (A) 01/21/2019 1520   APPEARANCEUR CLOUDY (A) 01/22/2019 1520   LABSPEC 1.016 01/24/2019 1520   PHURINE 5.0 02/13/2019 1520   GLUCOSEU NEGATIVE 02/07/2019 1520   HGBUR NEGATIVE 01/21/2019 1520   BILIRUBINUR NEGATIVE 01/21/2019 1520   KETONESUR 5 (A) 01/29/2019 1520    PROTEINUR 100 (A) 01/28/2019 1520   NITRITE POSITIVE (A) 01/25/2019 1520   LEUKOCYTESUR LARGE (A) 02/03/2019 1520    Radiological Exams on Admission: DG Chest Portable 1 View  Result Date: 01/18/2019 CLINICAL DATA:  Hypoxia.  COVID positive. EXAM: PORTABLE CHEST 1 VIEW COMPARISON:  Chest x-ray dated April 11, 2018. FINDINGS: Mild cardiomegaly. Atherosclerotic calcification of the aortic arch. Normal pulmonary vascularity. New elevation of the right hemidiaphragm. Mild patchy bibasilar opacities. No pleural effusion or pneumothorax. No acute osseous abnormality. IMPRESSION: 1. Mild patchy bibasilar opacities, nonspecific, but concerning for atypical infection, including potential viral pneumonia. Electronically Signed   By: Titus Dubin M.D.   On: 02/09/2019 15:57    EKG: Independently reviewed.   Assessment/Plan Active Problems:   COVID-19  COVID19 pneumonia: will start IV remdesivir, steroids, vitamin c & zinc. Inflammatory markers are elevated. Albuterol prn. Continue on supplemental oxygen and wean as tolerated. Airborne and contact precautions. Encourage incentive spirometry.   Sepsis: secondary to COVID19 pneumonia & UTI. Will start IV abxs  Hypotension: likely secondary to dehydration. Continue on IVFs. Keep MAP >65  Lactic acidosis: continue on IVFs. Repeat lactic acid ordered  UTI: UA is positive, urine cx is pending. Will start IV ceftriaxone. Given cefepime & flagyl in ER  Acute encephalopathy: likely metabolic vs secondary to [id number]. NPO. Speech consulted   Elevated troponins: likely secondary to demand ischemia. Continue on tele   Hyponatremia: likely secondary to dehydration. Continue on IVFs  Hypotension: likely secondary to dehydration. Continue on IVFs. Keep MAP >65  DM2: will start SSI w/ accuchecks  AKI: baseline Cr is unknown. Will continue on IVFs  Multiple sclerosis: w/o exacerbation   Depression: severity unknown. Will restart home dose of duloxetine  when pt is no longer NPO   GERD: will continue on PPI   DVT prophylaxis: lovenox Code Status: full  Family Communication:  Disposition Plan:  Consults called: n/a Admission status: inpatient    Wyvonnia Dusky MD [facility] Pager [phone]  If 7PM-7AM, please contact night-coverage [email or web address] Password [facility]  02/06/2019, 5:31 PM

## 2019-01-19 NOTE — ED Notes (Signed)
Here for 1:1 Recruitment consultant.  Pt resting, eyes open.

## 2019-01-19 NOTE — Progress Notes (Signed)
PHARMACY -  BRIEF ANTIBIOTIC NOTE   Pharmacy has received consult(s) for vanc and cefepime from an ED provider.  The patient's profile has been reviewed for ht/wt/allergies/indication/available labs.    One time order(s) placed for vanc 1 g + cefepime 2 g  Further antibiotics/pharmacy consults should be ordered by admitting physician if indicated.                       Thank you,  Pricilla Riffle, PharmD Feb 01, 2019  3:22 PM

## 2019-01-19 NOTE — ED Notes (Addendum)
Pt heard calling out to staff. Pt seen attempting to pull herself up in the bed to a sitting position. Pt states "it hurts" and when asked where she is hurting she points to her index finger where her O2 sensor is taped. Pt appears restless. MD aware. Charge nurse notified that a sitter may be needed to ensure pt safety. Blinds open on exam room door to better visualize pt.

## 2019-01-19 NOTE — ED Notes (Signed)
Pt heard calling out to staff. Pt states "come help me".  Upon entering the room pt appears to be attempting to get up out of bed. When asked what is hurting her pt unable to express what is bothering her. Pt denies chest pain. Increased work of breathing continues. MD and Charge nurse aware. Sitter requested.

## 2019-01-19 NOTE — ED Notes (Signed)
Pt now has sitter at the bedside for safety. Pt remains restless.

## 2019-01-19 NOTE — ED Triage Notes (Signed)
See first nurse note. Pt poor historian in triage, taking mask and [place] O2 off in triage, moaning, c/o cp and shob. Diaphoretic in triage. Sats 92% on 6L.

## 2019-01-20 ENCOUNTER — Other Ambulatory Visit: Payer: Self-pay

## 2019-01-20 ENCOUNTER — Inpatient Hospital Stay (HOSPITAL_COMMUNITY)
Admission: AD | Admit: 2019-01-20 | Payer: Medicare PPO | Source: Other Acute Inpatient Hospital | Admitting: Internal Medicine

## 2019-01-20 ENCOUNTER — Inpatient Hospital Stay
Admit: 2019-01-20 | Discharge: 2019-01-20 | Disposition: A | Discharge status: Home / Self Care | Payer: Medicare PPO | Attending: Acute Care | Admitting: Acute Care

## 2019-01-20 DIAGNOSIS — G35 Multiple sclerosis: Secondary | ICD-10-CM

## 2019-01-20 DIAGNOSIS — G9340 Encephalopathy, unspecified: Secondary | ICD-10-CM

## 2019-01-20 DIAGNOSIS — A419 Sepsis, unspecified organism: Secondary | ICD-10-CM

## 2019-01-20 DIAGNOSIS — E785 Hyperlipidemia, unspecified: Secondary | ICD-10-CM

## 2019-01-20 DIAGNOSIS — R6521 Severe sepsis with septic shock: Secondary | ICD-10-CM

## 2019-01-20 DIAGNOSIS — U071 COVID-19: Secondary | ICD-10-CM

## 2019-01-20 DIAGNOSIS — E1169 Type 2 diabetes mellitus with other specified complication: Secondary | ICD-10-CM

## 2019-01-20 DIAGNOSIS — J9600 Acute respiratory failure, unspecified whether with hypoxia or hypercapnia: Secondary | ICD-10-CM

## 2019-01-20 DIAGNOSIS — I249 Acute ischemic heart disease, unspecified: Secondary | ICD-10-CM

## 2019-01-20 DIAGNOSIS — J9621 Acute and chronic respiratory failure with hypoxia: Secondary | ICD-10-CM | POA: Diagnosis present

## 2019-01-20 DIAGNOSIS — R6520 Severe sepsis without septic shock: Secondary | ICD-10-CM

## 2019-01-20 LAB — FIBRIN DERIVATIVES D-DIMER (ARMC ONLY): Fibrin derivatives D-dimer (ARMC): 921.47 ng/mL (FEU) — ABNORMAL HIGH (ref 0.00–499.00)

## 2019-01-20 LAB — COMPREHENSIVE METABOLIC PANEL
ALT: 24 U/L (ref 0–44)
AST: 58 U/L — ABNORMAL HIGH (ref 15–41)
Albumin: 2.8 g/dL — ABNORMAL LOW (ref 3.5–5.0)
Alkaline Phosphatase: 106 U/L (ref 38–126)
Anion gap: 12 (ref 5–15)
BUN: 36 mg/dL — ABNORMAL HIGH (ref 8–23)
CO2: 24 mmol/L (ref 22–32)
Calcium: 8.7 mg/dL — ABNORMAL LOW (ref 8.9–10.3)
Chloride: 100 mmol/L (ref 98–111)
Creatinine, Ser: 1.05 mg/dL — ABNORMAL HIGH (ref 0.44–1.00)
GFR calc Af Amer: 57 mL/min — ABNORMAL LOW (ref >60)
GFR calc non Af Amer: 49 mL/min — ABNORMAL LOW (ref >60)
Glucose, Bld: 199 mg/dL — ABNORMAL HIGH (ref 70–99)
Potassium: 5 mmol/L (ref 3.5–5.1)
Sodium: 136 mmol/L (ref 135–145)
Total Bilirubin: 0.4 mg/dL (ref 0.3–1.2)
Total Protein: 7.8 g/dL (ref 6.5–8.1)

## 2019-01-20 LAB — BLOOD GAS, ARTERIAL
Acid-Base Excess: 1 mmol/L (ref 0.0–2.0)
Bicarbonate: 26.6 mmol/L (ref 20.0–28.0)
FIO2: 0.28
O2 Saturation: 93.2 %
Patient temperature: 37
pCO2 arterial: 45 mmHg (ref 32.0–48.0)
pH, Arterial: 7.38 (ref 7.350–7.450)
pO2, Arterial: 69 mmHg — ABNORMAL LOW (ref 83.0–108.0)

## 2019-01-20 LAB — CBC
HCT: 35 % — ABNORMAL LOW (ref 36.0–46.0)
Hemoglobin: 11.2 g/dL — ABNORMAL LOW (ref 12.0–15.0)
MCH: 31.6 pg (ref 26.0–34.0)
MCHC: 32 g/dL (ref 30.0–36.0)
MCV: 98.9 fL (ref 80.0–100.0)
Platelets: 222 10*3/uL (ref 150–400)
RBC: 3.54 MIL/uL — ABNORMAL LOW (ref 3.87–5.11)
RDW: 14.6 % (ref 11.5–15.5)
WBC: 4.1 10*3/uL (ref 4.0–10.5)
nRBC: 0 % (ref 0.0–0.2)

## 2019-01-20 LAB — ECHOCARDIOGRAM COMPLETE
Height: 66 in
Weight: 2959.98 oz

## 2019-01-20 LAB — GLUCOSE, CAPILLARY
Glucose-Capillary: 266 mg/dL — ABNORMAL HIGH (ref 70–99)
Glucose-Capillary: 184 mg/dL — ABNORMAL HIGH (ref 70–99)
Glucose-Capillary: 191 mg/dL — ABNORMAL HIGH (ref 70–99)
Glucose-Capillary: 174 mg/dL — ABNORMAL HIGH (ref 70–99)
Glucose-Capillary: 175 mg/dL — ABNORMAL HIGH (ref 70–99)
Glucose-Capillary: 174 mg/dL — ABNORMAL HIGH (ref 70–99)
Glucose-Capillary: 147 mg/dL — ABNORMAL HIGH (ref 70–99)
Glucose-Capillary: 177 mg/dL — ABNORMAL HIGH (ref 70–99)

## 2019-01-20 LAB — MRSA PCR SCREENING: MRSA by PCR: NEGATIVE

## 2019-01-20 LAB — TROPONIN I (HIGH SENSITIVITY)
Troponin I (High Sensitivity): 1194 ng/L (ref <18)
Troponin I (High Sensitivity): 1269 ng/L (ref <18)
Troponin I (High Sensitivity): 1608 ng/L (ref <18)

## 2019-01-20 LAB — HEPARIN LEVEL (UNFRACTIONATED)
Heparin Unfractionated: 1.01 IU/mL — ABNORMAL HIGH (ref 0.30–0.70)
Heparin Unfractionated: 0.32 IU/mL (ref 0.30–0.70)

## 2019-01-20 LAB — C-REACTIVE PROTEIN: CRP: 4.8 mg/dL — ABNORMAL HIGH (ref <1.0)

## 2019-01-20 LAB — STREP PNEUMONIAE URINARY ANTIGEN: Strep Pneumo Urinary Antigen: NEGATIVE

## 2019-01-20 LAB — FERRITIN: Ferritin: 1353 ng/mL — ABNORMAL HIGH (ref 11–307)

## 2019-01-20 LAB — PROCALCITONIN: Procalcitonin: 0.34 ng/mL

## 2019-01-20 MED ORDER — FAMOTIDINE IN NACL 20-0.9 MG/50ML-% IV SOLN
20.0000 mg | Freq: Two times a day (BID) | INTRAVENOUS | Status: DC
  Administered 2019-01-20 (×2): 20 mg via INTRAVENOUS
  Filled 2019-01-20 (×2): qty 50

## 2019-01-20 MED ORDER — THIAMINE HCL 100 MG/ML IJ SOLN
200.0000 mg | Freq: Two times a day (BID) | INTRAVENOUS | Status: DC
  Administered 2019-01-20 (×2): 200 mg via INTRAVENOUS
  Filled 2019-01-20 (×3): qty 2

## 2019-01-20 MED ORDER — HEPARIN (PORCINE) 25000 UT/250ML-% IV SOLN
900.0000 [IU]/h | INTRAVENOUS | Status: DC
  Administered 2019-01-20: 900 [IU]/h via INTRAVENOUS

## 2019-01-20 MED ORDER — METOPROLOL TARTRATE 25 MG PO TABS: 12.5000 mg | ORAL_TABLET | Freq: Once | ORAL | Status: DC

## 2019-01-20 MED ORDER — HEPARIN (PORCINE) 25000 UT/250ML-% IV SOLN
INTRAVENOUS | Status: AC
  Administered 2019-01-20: 4000 [IU] via INTRAVENOUS
  Filled 2019-01-20: qty 250

## 2019-01-20 MED ORDER — SODIUM CHLORIDE 0.9 % IV SOLN
100.0000 mg | Freq: Two times a day (BID) | INTRAVENOUS | Status: DC
  Administered 2019-01-20 (×2): 100 mg via INTRAVENOUS
  Filled 2019-01-20 (×3): qty 100

## 2019-01-20 MED ORDER — ORAL CARE MOUTH RINSE
15.0000 mL | Freq: Two times a day (BID) | OROMUCOSAL | Status: DC
  Administered 2019-01-20 – 2019-01-23 (×4): 15 mL via OROMUCOSAL

## 2019-01-20 MED ORDER — METOPROLOL TARTRATE 25 MG PO TABS
12.5000 mg | ORAL_TABLET | Freq: Two times a day (BID) | ORAL | Status: DC
  Filled 2019-01-20: qty 1

## 2019-01-20 MED ORDER — HYDROMORPHONE HCL 1 MG/ML IJ SOLN
INTRAMUSCULAR | Status: AC
  Filled 2019-01-20: qty 1

## 2019-01-20 MED ORDER — HEPARIN (PORCINE) 25000 UT/250ML-% IV SOLN
700.0000 [IU]/h | INTRAVENOUS | Status: DC
  Administered 2019-01-20 (×2): 700 [IU]/h via INTRAVENOUS
  Filled 2019-01-20 (×3): qty 250

## 2019-01-20 MED ORDER — ALBUTEROL SULFATE HFA 108 (90 BASE) MCG/ACT IN AERS
2.0000 | INHALATION_SPRAY | RESPIRATORY_TRACT | Status: DC | PRN
  Filled 2019-01-20: qty 6.7

## 2019-01-20 MED ORDER — HYDROMORPHONE HCL 1 MG/ML IJ SOLN
1.0000 mg | Freq: Once | INTRAMUSCULAR | Status: AC
  Administered 2019-01-20: 1 mg via INTRAVENOUS

## 2019-01-20 MED ORDER — HEPARIN BOLUS VIA INFUSION
4000.0000 [IU] | Freq: Once | INTRAVENOUS | Status: AC
  Filled 2019-01-20: qty 4000

## 2019-01-20 MED ORDER — SODIUM CHLORIDE 0.9 % IV SOLN
2.0000 g | INTRAVENOUS | Status: DC
  Administered 2019-01-20: 2 g via INTRAVENOUS
  Filled 2019-01-20 (×2): qty 20

## 2019-01-20 MED ORDER — CHLORHEXIDINE GLUCONATE CLOTH 2 % EX PADS
6.0000 | MEDICATED_PAD | Freq: Every day | CUTANEOUS | Status: DC
  Administered 2019-01-20: 6 via TOPICAL

## 2019-01-20 MED ORDER — VITAMIN D 25 MCG (1000 UNIT) PO TABS: 2000.0000 [IU] | ORAL_TABLET | Freq: Every day | ORAL | Status: DC

## 2019-01-20 MED ORDER — IVERMECTIN 3 MG PO TABS
200.0000 ug/kg | ORAL_TABLET | ORAL | Status: DC
  Filled 2019-01-20: qty 6

## 2019-01-20 NOTE — ED Notes (Signed)
Messaged Admit MD Luberta Robertson regarding pt's plan of care and status to go or not to [facility].

## 2019-01-20 NOTE — ED Notes (Addendum)
Pt is altered and unable to sign consent for transfer. Will message MD Admit to make sure family aware of transfer.

## 2019-01-20 NOTE — ED Notes (Signed)
Pt becoming increasingly restless. Sitter remains at the bedside. Admitting MD aware.

## 2019-01-20 NOTE — ED Notes (Signed)
Admit MD Luberta Robertson at bedside

## 2019-01-20 NOTE — Progress Notes (Addendum)
Inpatient Diabetes Program Recommendations  AACE/ADA: New Consensus Statement on Inpatient Glycemic Control   Target Ranges:  Prepandial:   less than 140 mg/dL      Peak postprandial:   less than 180 mg/dL (1-2 hours)      Critically ill patients:  140 - 180 mg/dL   Results for Stefanie Pham, Stefanie Pham (MRN [id number]) as of 01/20/2019 08:10  Ref. Range 01/22/2019 18:12 01/18/2019 20:35 01/17/2019 23:02 01/20/2019 02:11 01/20/2019 05:31 01/20/2019 08:01  Glucose-Capillary Latest Ref Range: 70 - 99 mg/dL 179 (H) 810 (H) 254 (H) 266 (H) 184 (H) 191 (H)   Review of Glycemic Control  Diabetes history: DM2 Outpatient Diabetes medications: Lantus 10 units QHS Current orders for Inpatient glycemic control: Novolog 0-9 units Q4H; Decadron 6 mg Q24H  Inpatient Diabetes Program Recommendations:   Insulin-Basal: If steroids are continued, please consider ordering Lantus 10 units Q24H.  Thanks, Orlando Penner, RN, MSN, CDE Diabetes Coordinator Inpatient Diabetes Program [phone] (Team Pager from 8am to 5pm)

## 2019-01-20 NOTE — ED Notes (Signed)
Admit MD messaged to make sure that family is aware of transfer.

## 2019-01-20 NOTE — ED Notes (Signed)
Pt had band-aid on fourth digit of left hand. Band-aid removed. Pts fingernail does not appear to be present. Nail bed is scabbed over and crusty. No drainage or bleeding noted. Cardiologist in room to see pt. Pt remains confused. Pt will repeat things that are said for a few minutes before seeming to forget and move onto something else. This tech will continue as 1:1 sitter.

## 2019-01-20 NOTE — ED Notes (Signed)
Per Admit MD family not yet consenting to transfer. She states they are going to pick up another pt. Will let Charge Tammy Sours know.

## 2019-01-20 NOTE — Progress Notes (Signed)
ANTICOAGULATION CONSULT NOTE  Pharmacy Consult for Heparin Indication: chest pain/ACS  Allergies  Allergen Reactions  . Hydralazine Hcl Other (See Comments)    Severe hyponatremia  . Morphine And Related Shortness Of Breath  . Codeine Nausea And Vomiting    Patient Measurements: Height: 5\' 6"  (167.6 cm) Weight: 172 lb 6.4 oz (78.2 kg) IBW/kg (Calculated) : 59.3 HEPARIN DW (KG): 75.3  Vital Signs: Temp: 98.7 F (37.1 C) (01/06 2000) Temp Source: Axillary (01/06 2000) BP: 135/77 (01/06 2000) Pulse Rate: 84 (01/06 2000)  Labs: Recent Labs    01/27/2019 1448 02/04/2019 1520 01/20/19 0055 01/20/19 0727 01/20/19 0856 01/20/19 1804 01/20/19 2150  HGB 11.7*  --   --  11.2*  --   --   --   HCT 37.4  --   --  35.0*  --   --   --   PLT 198  --   --  222  --   --   --   APTT  --  31  --   --   --   --   --   LABPROT  --  12.9  --   --   --   --   --   INR  --  1.0  --   --   --   --   --   HEPARINUNFRC  --   --   --   --  1.01*  --  0.32  CREATININE 1.03*  --   --  1.05*  --   --   --   TROPONINIHS 94*  --  1,194* 1,269*  --  1,608*  --     Estimated Creatinine Clearance: 42.9 mL/min (A) (by C-G formula based on SCr of 1.05 mg/dL (H)).   Medical History: Past Medical History:  Diagnosis Date  . Diabetes mellitus without complication ([facility])   . Diverticulitis   . Hernia, inguinal   . Hypertension   . Multiple sclerosis ([facility])     Assessment: Asked to initiate Heparin protocol for ACS.  No anticoagulants PTA per current med list.  Baseline labs ordered.   1/6 Heparin infusion started @ 900 units/hr   Goal of Therapy:  Heparin level 0.3-0.7 units/ml Monitor platelets by anticoagulation protocol: Yes   Plan:  1/6 @ 0856 HL: 1.01. Level is supratherapeutic. 3 Will hold heparin infusion for 1 hours and restart infusion at reduced infusion rate of 700 units/hr. Spoke with RN about plan.  0106 @ 2150 HL = 0.32, therapeutic.  Continue current rate and recheck HL in 8 hrs  to confirm CBCs daily per protocol while on heparin.   2151, PharmD Clinical Pharmacist 01/20/2019 10:28 PM

## 2019-01-20 NOTE — ED Notes (Addendum)
Assisted with repositioning pt and changing pt depends. Pt c/o difficulty breathing. O2 increased to 3-4L until pt appears more comfortable. New purewick placed. Provided for comfort and safety and will continue to assess. Increased work of breathing present.

## 2019-01-20 NOTE — ED Notes (Signed)
Pts hair and face washed by this tech. Pt able to follow some commands. Pt still using accessory muscles when breathing. Will continue to monitor as 1:1 sitter.

## 2019-01-20 NOTE — ED Notes (Signed)
Pt placed in clean brief and repositioned in bed by this rn and dorian edt.

## 2019-01-20 NOTE — ED Notes (Signed)
Pt Md Luberta Robertson no need to treat pt's bp right now. Pt's current bp 149/91.

## 2019-01-20 NOTE — ED Notes (Signed)
Admit MD Newman Pies again regarding pt's current status.

## 2019-01-20 NOTE — ED Notes (Addendum)
Pts rectal temp checked by this tech. Rectal temp of 100.1. Pts brief changed and clean one applied. Pt repositioned to have pillow under left side in order to relieve pressure on left side. Pt continues to have labored breathing and using accessory muscles. Pt confused, not making sense but still able to follow some commands. Pt not as active as she had been earlier in the day.  This tech will continue to monitor as 1:1 sitter.

## 2019-01-20 NOTE — ED Notes (Addendum)
Pt resting with eyes closed. Continued increased work of breathing noted. Lights off to enhance rest. Sitter at the bedside. Spoke with admitting regarding pt admission and was instructed to hold off taking pt to admission room at this time.

## 2019-01-20 NOTE — Progress Notes (Addendum)
ANTICOAGULATION CONSULT NOTE - Initial Consult  Pharmacy Consult for Heparin Indication: chest pain/ACS  Allergies  Allergen Reactions  . Hydralazine Hcl Other (See Comments)    Severe hyponatremia  . Morphine And Related Shortness Of Breath  . Codeine Nausea And Vomiting    Patient Measurements: Height: 5\' 6"  (167.6 cm) Weight: 185 lb (83.9 kg) IBW/kg (Calculated) : 59.3 HEPARIN DW (KG): 77.1  Vital Signs: Temp: 100.2 F (37.9 C) (01/06 1016) Temp Source: Rectal (01/06 1016) BP: 143/91 (01/06 0945) Pulse Rate: 100 (01/06 0931)  Labs: Recent Labs    01/21/2019 1448 02/08/2019 1520 02/14/2019 2207 01/20/19 0055 01/20/19 0727 01/20/19 0856  HGB 11.7*  --   --   --  11.2*  --   HCT 37.4  --   --   --  35.0*  --   PLT 198  --   --   --  222  --   APTT  --  31  --   --   --   --   LABPROT  --  12.9  --   --   --   --   INR  --  1.0  --   --   --   --   HEPARINUNFRC  --   --   --   --   --  1.01*  CREATININE 1.03*  --   --   --  1.05*  --   TROPONINIHS 94*  --  861* 1,194* 1,269*  --     Estimated Creatinine Clearance: 44.3 mL/min (A) (by C-G formula based on SCr of 1.05 mg/dL (H)).   Medical History: Past Medical History:  Diagnosis Date  . Diabetes mellitus without complication ([facility])   . Diverticulitis   . Hernia, inguinal   . Hypertension   . Multiple sclerosis ([facility])     Assessment: Asked to initiate Heparin protocol for ACS.  No anticoagulants PTA per current med list.  Baseline labs ordered.   1/6 Heparin infusion started @ 900 units/hr   Goal of Therapy:  Heparin level 0.3-0.7 units/ml Monitor platelets by anticoagulation protocol: Yes   Plan:  1/6 @ 0856 HL: 1.01. Level is supratherapeutic. 3 Will hold heparin infusion for 1 hours and restart infusion at reduced infusion rate of 700 units/hr. Spoke with RN about plan.  Recheck heparin level in 8 hours after rate change. CBCs daily per protocol while on heparin.   03/20/19, PharmD,  BCPS Clinical Pharmacist 01/20/2019 10:23 AM

## 2019-01-20 NOTE — ED Notes (Addendum)
Pt still appears dazed and still confused. Pt still exhibiting the accessory muscle use and labored breathing.

## 2019-01-20 NOTE — ED Notes (Signed)
Cardiologist [place] at bedside   

## 2019-01-20 NOTE — ED Notes (Signed)
Daughter Angi updated on pt status.

## 2019-01-20 NOTE — ED Notes (Signed)
Attempted to call [facility] to give report, RN to call back

## 2019-01-20 NOTE — ED Notes (Signed)
Pts rectal temp checked by this tech and Sam, Charity fundraiser. Temp 100.2. Sacral pad placed on pt due to pt having stage 1 pressure ulcer with initials and date. Pt repositioned and barrier cream placed on pts buttocks and thighs. Brief changed and new blanket provided. Pt still confused and exhibiting labored breathing with accessory muscle use.

## 2019-01-20 NOTE — ED Notes (Signed)
Per MD Admit Luberta Robertson pt will need stepdown for level of care. Will inform RN Tammy Sours and make sure orders are correct

## 2019-01-20 NOTE — ED Notes (Addendum)
Pt sitting up on stretcher appears more alert with slightly less work of breathing noted. Pt states she is feeling a little better. Lights off to enhance rest; iv medication infusing without difficulty. Will continue to assess.

## 2019-01-20 NOTE — ED Notes (Signed)
Report given to Tiffany RN 

## 2019-01-20 NOTE — ED Notes (Signed)
Fluids were paused for unknown reason, fluids were restarted at 24ml/hr per orders in [facility].

## 2019-01-20 NOTE — Consult Note (Signed)
Name: Stefanie Pham MRN: [id number] DOB: April 08, 1933    ADMISSION DATE:  01/24/2019 CONSULTATION DATE:  01/20/2019  REFERRING MD : Sharion Settler, NP  CHIEF COMPLAINT: Shortness of breath  BRIEF PATIENT DESCRIPTION:  86 year old female admitted with acute hypoxic respiratory failure and sepsis secondary to COVID-19 pneumonia, UTI, and NSTEMI requiring heparin drip.  CTA chest negative for PE, however does reveal multifocal pneumonia and mucous plugging of several right lower bronchi.  PCCM consulted for consideration of bronchoscopy.  SIGNIFICANT EVENTS  1/5>> presented to ED; awaiting admission to stepdown unit 1/6>> increased work of breathing.  CTA chest negative for PE, with multifocal pneumonia and questionable mucous plugging 1/6>> PCCM consulted for consideration of bronchoscopy; bronchoscopy contraindicated in the setting of COVID-19, possible myocardial injury, distal mucous plugging not reachable by bronchoscope.  STUDIES:  CTA chest 1/5>>1. Limited study due to respiratory motion artifact. No definite CT evidence of central pulmonary artery embolus. 2. Bilateral lower lobe patchy and streaky airspace opacities most consistent with multifocal pneumonia. Clinical correlation and follow-up to resolution recommended. 3. Moderate cardiomegaly with evidence of right heart dysfunction. 4. Mucous impaction of several right lower lobe bronchi. Aspiration not excluded. 5. Aortic Atherosclerosis  CULTURES: COVID-19 Ag 1/5>> positive Blood culture x2 1/5>> Urine 1/5>> Strep pneumo urinary antigen 1/6>> Legionella urinary antigen 1/6>> Sputum 1/6>>  ANTIBIOTICS: Cefepime x1 dose in ED 1/5 Flagyl x1 dose in ED 1/5 Vancomycin x1 dose in ED 1/5 Ceftriaxone 1/5>> Doxycycline1/6>>  HISTORY OF PRESENT ILLNESS:   Stefanie Pham is a 86 year old female with a past medical history notable for multiple sclerosis, diabetes mellitus type 2, hypertension, depression, GERD who presents to  [facility] ED on 02/06/2019 with complaints of shortness of breath and altered mental status.  Patient currently with altered mental status, therefore history and physical with is obtained from ED and nursing notes.  Per notes she reported having shortness of breath for "a long while".  Upon presentation to the ED she was noted to be hypoxic requiring 4 L nasal cannula.  Initial work-up in the ED revealed sodium 133, glucose 310, creatinine 1.03, BUN 35, high-sensitivity troponin 94, ferritin 536, CRP 5.8, hemoglobin 11.7, D-dimer 1.38, fibrinogen 673.  Her COVID-19 antigen is positive.  Urinalysis is consistent with UTI.  Chest x-ray with patchy bibasilar opacities concerning for atypical infection versus viral pneumonia.  She met sepsis criteria therefore she received IV fluid resuscitation and broad-spectrum antibiotics.  She was to be admitted to the telemetry unit by the hospitalist for further work-up and treatment of acute hypoxic respiratory failure and sepsis in the setting of COVID-19 pneumonia, UTI, and non-STEMI.  While awaiting bed placement in the ED she developed worsening shortness of breath and restlessness.  CTA chest was obtained which was negative for PE, however revealed bilateral lobe opacities consistent with multifocal pneumonia, cardiomegaly, and mucous plugging of several right lower lobe bronchi (distal).  PCCM is consulted for consideration of bronchoscopy.  PAST MEDICAL HISTORY :   has a past medical history of Diabetes mellitus without complication ([place]), Diverticulitis, Hernia, inguinal, Hypertension, and Multiple sclerosis ([place]).  has a past surgical history that includes Cholecystectomy; Appendectomy; Replacement total knee (Left, 2); ORIF ankle fracture (Left); Abdominal hysterectomy; Hernia repair; Cervical spine surgery (N/A); and Amputation toe (Left, 03/11/2018). Prior to Admission medications   Medication Sig Start Date End Date Taking? Authorizing Provider    amoxicillin-clavulanate (AUGMENTIN) 500-125 MG tablet Take 1 tablet by mouth 2 (two) times daily.   Yes [provider]  Ascorbic Acid 125 MG CHEW Chew 125 mg by mouth daily.   Yes [provider]  benazepril (LOTENSIN) 40 MG tablet Take 40 mg by mouth daily.   Yes [provider]  Cholecalciferol (HM VITAMIN D3) 4000 UNITS CAPS Take 1 capsule by mouth at bedtime.   Yes [provider]  diphenhydrAMINE (BENADRYL) 25 mg capsule Take 50 mg by mouth at bedtime as needed for sleep.   Yes [provider]  docusate sodium (COLACE) 100 MG capsule Take 100-200 mg by mouth 2 (two) times daily as needed for mild constipation.   Yes [provider]  DULoxetine (CYMBALTA) 30 MG capsule Take 30 mg by mouth daily.   Yes [provider]  ferrous gluconate (FERGON) 324 MG tablet Take 1 tablet by mouth daily. 01/21/18  Yes [provider]  gabapentin (NEURONTIN) 300 MG capsule Take 400 mg by mouth 5 (five) times daily.  04/10/18  Yes [provider]  glimepiride (AMARYL) 4 MG tablet Take 4 mg by mouth daily. 03/09/18 03/09/19 Yes [provider]  Insulin Glargine (LANTUS SOLOSTAR) 100 UNIT/ML Solostar Pen Inject 10 Units into the skin Nightly. 01/19/18 02/09/2019 Yes [provider]  Lactobacillus Acid-Pectin (EQL PROBIOTIC ACIDOPHILUS) CAPS Take 1 capsule by mouth daily.   Yes [provider]  methadone (DOLOPHINE) 5 MG tablet Take 5 mg by mouth every 6 (six) hours.   Yes [provider]  Multiple Vitamins-Minerals (MULTIVITAMIN PO) Take 2 each by mouth at bedtime.   Yes [provider]  omeprazole (PRILOSEC) 20 MG capsule Take 20 mg by mouth daily.   Yes [provider]  prochlorperazine (COMPAZINE) 10 MG tablet Take 10 mg by mouth every 6 (six) hours as needed for nausea or vomiting.   Yes [provider]  promethazine (PHENERGAN) 12.5 MG tablet Take 12.5 mg by mouth every 6 (six)  hours as needed for nausea or vomiting.   Yes [provider]  Pyridoxine HCl (VITAMIN B-6 PO) Take 1 tablet by mouth at bedtime.   Yes [provider]  aspirin EC 81 MG tablet Take 81 mg by mouth at bedtime.    [provider]  Bioflavonoid Products (VITAMIN C) CHEW Chew 1 each by mouth at bedtime.    [provider]  Calcium Carbonate-Vitamin D (CALCIUM 500/D PO) Take 1 tablet by mouth daily.    [provider]  interferon beta-1a (AVONEX) 30 MCG injection Inject 30 mcg into the muscle once a week.    [provider]  magnesium gluconate (MAGONATE) 500 MG tablet Take 500 mg by mouth daily.    [provider]  mometasone (NASONEX) 50 MCG/ACT nasal spray Place 2 sprays into the nose at bedtime.    [provider]  NARCAN 4 MG/0.1ML LIQD nasal spray kit Place 4 mg into the nose once. 10/30/17   [provider]  silver sulfADIAZINE (SILVADENE) 1 % cream Apply 1 application topically 2 (two) times daily. 01/15/18   [provider]   Allergies  Allergen Reactions  . Hydralazine Hcl Other (See Comments)    Severe hyponatremia  . Morphine And Related Shortness Of Breath  . Codeine Nausea And Vomiting    FAMILY HISTORY:  family history includes COPD in her father. SOCIAL HISTORY:  reports that she has never smoked. She has never used smokeless tobacco. She reports that she does not drink alcohol or use drugs.   COVID-19 DISASTER DECLARATION:  FULL CONTACT PHYSICAL EXAMINATION WAS NOT POSSIBLE  DUE TO TREATMENT OF COVID-19 AND  CONSERVATION OF PERSONAL PROTECTIVE EQUIPMENT, LIMITED EXAM FINDINGS INCLUDE-  Patient assessed or the symptoms described in the history of present illness.  In the context of the Global COVID-19 pandemic, which necessitated consideration that the patient might be at risk for infection with the SARS-CoV-2 virus that causes COVID-19, Institutional protocols and algorithms that  pertain to the evaluation of patients at risk for COVID-19 are in a state of rapid change based on information released by regulatory bodies including the CDC and federal and state organizations. These policies and algorithms were followed during the patient's care while in hospital.  REVIEW OF SYSTEMS: Unable to obtain review of systems due to patient's respiratory distress current acuity of condition  SUBJECTIVE:   VITAL SIGNS: Pulse Rate:  [62-125] 101 (01/06 0530) Resp:  [14-34] 24 (01/06 0530) BP: (72-163)/(47-117) 141/117 (01/06 0530) SpO2:  [89 %-99 %] 97 % (01/06 0530) Weight:  [83.9 kg] 83.9 kg (01/05 1436)  PHYSICAL EXAMINATION: Limited examination due to PPE/CAPR General: Frail elderly woman, tachypneic in moderate respiratory distress Neuro: No overt focal deficit, appears anxious, confused, agitated HEENT: Dry oral mucosa Cardiovascular: IRR with tachycardia on monitor Lungs: Unable to auscultate fully due to PPE Abdomen: Soft, nondistended Musculoskeletal: No edema, extremities appear perfused  Skin: No overt rashes  Recent Labs  Lab 02/08/2019 1448  NA 133*  K 4.9  CL 93*  CO2 27  BUN 35*  CREATININE 1.03*  GLUCOSE 310*   Recent Labs  Lab 01/22/2019 1448  HGB 11.7*  HCT 37.4  WBC 7.7  PLT 198   CT ANGIO CHEST PE W OR WO CONTRAST  Result Date: 01/20/2019 CLINICAL DATA:  86 year old female with shortness of breath and altered mental status. EXAM: CT ANGIOGRAPHY CHEST WITH CONTRAST TECHNIQUE: Multidetector CT imaging of the chest was performed using the standard protocol during bolus administration of intravenous contrast. Multiplanar CT image reconstructions and MIPs were obtained to evaluate the vascular anatomy. CONTRAST:  73m OMNIPAQUE IOHEXOL 350 MG/ML SOLN COMPARISON:  Chest radiograph dated 02/13/2019. FINDINGS: Evaluation of this exam is limited due to respiratory motion artifact. Cardiovascular: There is moderate cardiomegaly. There is retrograde flow of  contrast from the right atrium into the IVC in keeping with right heart dysfunction. No pericardial effusion. There is mild atherosclerotic calcification of the thoracic aorta. Mildly dilated main pulmonary trunk suggestive of a degree of pulmonary hypertension. Clinical correlation is recommended. Evaluation of the pulmonary arteries is very limited due to respiratory motion artifact. No definite large central pulmonary artery embolus identified. Mediastinum/Nodes: No definite hilar or mediastinal adenopathy. Evaluation however is limited due to consolidative changes of the perihilar lung. Esophagus is grossly unremarkable. No mediastinal fluid collection. Lungs/Pleura: Bilateral lower lobe patchy and streaky airspace opacities as well as scattered clusters of ground-glass densities primarily in the right upper lobe most consistent with multifocal pneumonia. Clinical correlation and follow-up to resolution recommended. There is eventration of the right hemidiaphragm with probable associated compressive atelectasis of the right lung base. No pleural effusion or pneumothorax. There is mucous impaction of several right lower lobe bronchi. Aspiration not excluded. Upper Abdomen: No acute abnormality. Musculoskeletal: Degenerative changes of the spine. No acute osseous pathology. Review of the MIP images confirms the above findings. IMPRESSION: 1. Limited study due to respiratory motion artifact. No definite CT evidence of central pulmonary artery embolus. 2. Bilateral lower lobe patchy and streaky airspace opacities most consistent with multifocal pneumonia. Clinical correlation and follow-up to resolution recommended. 3.  Moderate cardiomegaly with evidence of right heart dysfunction. 4. Mucous impaction of several right lower lobe bronchi. Aspiration not excluded. 5. Aortic Atherosclerosis (ICD10-I70.0). Electronically Signed   By: Anner Crete M.D.   On: 01/20/2019 00:07   DG Chest Portable 1 View  Result  Date: 01/30/2019 CLINICAL DATA:  Hypoxia.  COVID positive. EXAM: PORTABLE CHEST 1 VIEW COMPARISON:  Chest x-ray dated April 11, 2018. FINDINGS: Mild cardiomegaly. Atherosclerotic calcification of the aortic arch. Normal pulmonary vascularity. New elevation of the right hemidiaphragm. Mild patchy bibasilar opacities. No pleural effusion or pneumothorax. No acute osseous abnormality. IMPRESSION: 1. Mild patchy bibasilar opacities, nonspecific, but concerning for atypical infection, including potential viral pneumonia. Electronically Signed   By: Titus Dubin M.D.   On: 02/11/2019 15:57    ASSESSMENT / PLAN:  Acute hypoxic respiratory failure in the setting of COVID-19 pneumonia and mucous plugs right lower lobe bronchi (distal) -Supplemental O2 as needed to maintain O2 sats 86-92% -Allow for permissive hypoxia as long as patient asymptomatic -Continue IV steroids -Remdesivir -Vitamin C and zinc -As needed bronchodilators ( via MDI) -Encourage self proning as tolerated -Incentive spirometry  -The setting of COVID-19 bronchoscopy is contraindicated due to danger of aerosolizing COVID virus -Mucous plugs are distal and these would not be reached by bronchoscopy only central mucous plugs would be amenable to suctioning by bronchoscopy.   Sepsis secondary to COVID-19 pneumonia and UTI -Monitor fever curve -Trend WBCs and procalcitonin -Follow cultures as above -Continue Rocephin (received cefepime, vancomycin, and Flagyl in the ED -Rocephin adjusted to higher dose of 2 g/day -Add doxycycline  NSTEMI -Continuous cardiac monitoring -Maintain MAP greater than 65 -IV fluids -Heparin drip -Cardiology consulted, but suspect that management will be purely medical -Trend troponin until trending downward  AKI Lactic acidosis Hyponatremia Monitor I&O's / urinary output Follow BMP Ensure adequate renal perfusion Avoid nephrotoxic agents as able Replace electrolytes as indicated IV  fluids Trend lactic acid  Acute metabolic encephalopathy in setting of sepsis/COVID-19 History of multiple sclerosis without acute exacerbation -Provide supportive care -Avoid sedating meds as able -Continue methadone to avoid withdrawal  Diabetes mellitus -CBGs -Sliding scale insulin -Follow ICU hypo-/hyperglycemia protocol    Disposition: Stepdown [place] of care: DNR/DNI VTE prophylaaxis/anticoagulation: Heparin drip GI prophylaxis: Famotidine Updates: I discussed in detail with the patient's daughter [name] who is POA (phone number [phone]).  Discussion was via phone.  I made the patient's daughter aware of the severity of her condition and the very high morbidity and mortality due to COVID-19 with multiorgan involvement.  Patient has multiple comorbidities that also increase the likelihood for poor outcome.  Ms. Corky Downs made it clear that she does not want her mother intubated oral mechanical ventilation he is to be DNR/DNI.  Ms.Galdi states that she recently lost her father to glioblastoma and had to make these hard decisions as well.  If the patient should deteriorate respiratory wise she would prefer if she would be transition to comfort care at that point.  Patient will be DNR/DNI.     Renold Don, MD [facility]  01/20/2019, 6:08 AM

## 2019-01-20 NOTE — Progress Notes (Signed)
ANTICOAGULATION CONSULT NOTE - Initial Consult  Pharmacy Consult for Heparin Indication: chest pain/ACS  Allergies  Allergen Reactions  . Hydralazine Hcl Other (See Comments)    Severe hyponatremia  . Morphine And Related Shortness Of Breath  . Codeine Nausea And Vomiting    Patient Measurements: Height: 5\' 6"  (167.6 cm) Weight: 185 lb (83.9 kg) IBW/kg (Calculated) : 59.3 HEPARIN DW (KG): 77.1  Vital Signs: BP: 95/58 (01/05 2300) Pulse Rate: 71 (01/05 2300)  Labs: Recent Labs    02/11/2019 1448 01/17/2019 1520 01/25/2019 1725 01/22/2019 2207  HGB 11.7*  --   --   --   HCT 37.4  --   --   --   PLT 198  --   --   --   APTT  --  31  --   --   LABPROT  --  12.9  --   --   INR  --  1.0  --   --   CREATININE 1.03*  --   --   --   TROPONINIHS 94*  --  412* 861*    Estimated Creatinine Clearance: 45.1 mL/min (A) (by C-G formula based on SCr of 1.03 mg/dL (H)).   Medical History: Past Medical History:  Diagnosis Date  . Diabetes mellitus without complication (HCC)   . Diverticulitis   . Hernia, inguinal   . Hypertension   . Multiple sclerosis (H[facility])     Medications:  (Not in a hospital admission)   Assessment: Asked to initiate Heparin protocol for ACS.  No anticoagulants PTA per current med list.  Baseline labs ordered.   Goal of Therapy:  Heparin level 0.3-0.7 units/ml Monitor platelets by anticoagulation protocol: Yes   Plan:  Heparin 4000 units bolus now x 1 then infusion at 900 units/hr Check heparin level in 8 hours  2208 A 01/20/2019,12:05 AM

## 2019-01-20 NOTE — Progress Notes (Signed)
*  PRELIMINARY RESULTS* Echocardiogram 2D Echocardiogram has been performed.  Stefanie Pham 01/20/2019, 10:16 AM

## 2019-01-20 NOTE — ED Notes (Signed)
Admit MD Cecil Cranker regarding pt's 2 different level of care orders.

## 2019-01-20 NOTE — ED Notes (Signed)
repeat blue tube sent to lab

## 2019-01-20 NOTE — Consult Note (Signed)
Remdesivir - Pharmacy Brief Note  SARS Coronavirus 2 Ag: positive 02/03/2019  Day 2 Remdesivir   O:  ALT: 17 CXR: Mild patchy bibasilar opacities, concerning for atypical infection, including potential viral pneumonia. SpO2: 94% on 4L/min nasal cannula    A/P:  Remdesivir 200 mg IVPB once followed by 100 mg IVPB daily x 4 days.   Gardner Candle, PharmD, BCPS Clinical Pharmacist 01/20/2019 10:36 AM

## 2019-01-20 NOTE — ED Notes (Addendum)
Pt yelling, "Help me, help me. I can't breathe." Pt currently on 4L [place], O2 99%. Pt does have labored breathing and accessory muscle use present. Admit MD Newman Pies to check on comfort medication. Morning labs collected, pts IVs wrapped with gauze. Dorian EDT in room as 1:1 sitter.  Pt is able to be reoriented at this time. Pt aware that she is at the hospital. Pt able to follow commands.

## 2019-01-20 NOTE — ED Notes (Addendum)
Entered room to inform EDT Dorian of pt not going to [facility] at this time. Upon entry pt is now quiet and not talking like she has been. Pt's pupils are dilated but reactive and she is staring at the ceiling. Asked pt to squeeze my hands and pt not able to do so. Pt still exhibiting accessory muscle use and labored breathing. Pt placed back on HFNC at this time. Pt pulled up in the bed and repositioned at this time.  EDT Sitter1:1 Dorian at bedside.

## 2019-01-20 NOTE — ED Notes (Signed)
Pt transported to ICU by EDT Dorian and RN Florentina Addison

## 2019-01-20 NOTE — ED Notes (Addendum)
Pt placed on non-rebreather to be transported to gvc, satting at 95% at this time. Pt is following commands at this time, pt noted to be breathing as before with the  accessory muscle use. Pt says some words that are unclear.

## 2019-01-20 NOTE — ED Notes (Signed)
Spoke with MD Luberta Robertson regarding pt's current confusion and status. Per MD she plans top place pt on HFNC. She states that should help calm her down some. RN Katie informed to call RT and give them a heads up on plan. MD also calling daughter of pt to update.

## 2019-01-20 NOTE — ED Notes (Signed)
Per pharmacy hold heparin for one hour d/t elevated heparin level. Heparin stopped at this time.

## 2019-01-20 NOTE — ED Notes (Signed)
Echo at bedside

## 2019-01-20 NOTE — ED Notes (Addendum)
Per Admit MD pt is to stay here and not be transferred per family request. Pt to be admitted to ICU unit

## 2019-01-20 NOTE — Progress Notes (Signed)
SLP Cancellation Note  Patient Details Name: Stefanie Pham MRN: [id number] DOB: 10-Aug-1933   Cancelled treatment:       Reason Eval/Treat Not Completed: Medical issues which prohibited therapy;Patient not medically ready(chart reviewed; consulted NSG re: pt's status) Pt currently remains in the ED. Per chart notes, pt's respiratory status declined this AM and she was moved to HFNC 9L from 4L after labored breathing and accessory muscle use noted. Pt is confused. She also has a baseline of MS and Esophageal phase dysmotility (esophageal dilations and c/o difficulty w/ solid foods per past eval in 02/2018).  Due to pt' increased risk for aspiration at this time in light of Pulmonary decline, recommend holding on BSE and oral intake; alternative means for medications. ST services will f/u tomorrow w/ pt's status and BSE if appropriate. NSG updated.      Jerilynn Som, MS, CCC-SLP Wilma Wuthrich 01/20/2019, 12:05 PM

## 2019-01-20 NOTE — ED Notes (Addendum)
Admitting MD at the bedside due to concerns regarding pt status. Pt to be changed to ICU stepdown bed from telemetry bed. Sitter remains at the bedside.

## 2019-01-20 NOTE — ED Notes (Signed)
RT in room to put pt on high flow O2. Pt put on 9L [place] with humidifier. SpO2 100%.

## 2019-01-20 NOTE — Progress Notes (Signed)
PROGRESS NOTE    Stefanie Pham  DQQ:[id number]  DOB: 1933/12/31  DOA: 02/03/2019 PCP: Verner Mould, MD Outpatient Specialists:   Hospital course:  Patient is an 86 year old female with past medical history significant for multiple sclerosis, rheumatoid arthritis, DM 2, HTN, depression, esophageal stricture status post dilatation and GERD who was admitted 02/10/2019 complaining of "shortness of breath for a long time".  Evaluation in the ED revealed COVID-19 positivity and chest x-ray revealed mild patchy bibasilar opacities concerning for atypical infection.  She was initially needing oxygen 4 L by nasal cannula however over the course of the evening she became increasingly dyspneic and agitated and was increased to high flow nasal cannula 9 L O2.  Over the course of the night patient was noted to have increased troponin thought to be secondary demand ischemia.  She was started on remdesivir, steroids and as needed albuterol.  CT angiogram was negative for PE and did show multifocal pneumonia with mucous plugging.  After deterioration, PCCM was consulted for admission to ICU and Dr. Jayme Cloud spoke with patient's daughter on the phone.  Patient's daughter noted she did not want her mother to suffer and patient was made DNR.  Patient is to be admitted to stepdown.  On further conversation with the family, they do not want patient transferred to [facility].  Subjective:  Patient unable to provide history at this point.  She is breathing about 36-40 times a minute.  She does not yes when I say argue okay.  Objective: Vitals:   01/20/19 1024 01/20/19 1030 01/20/19 1045 01/20/19 1100  BP: (!) 123/98 (!) 128/92 (!) 150/84 (!) 158/86  Pulse: 96 78 78 95  Resp:  (!) 34 (!) 39 (!) 41  Temp:      TempSrc:      SpO2:   99% 98%  Weight:      Height:        Intake/Output Summary (Last 24 hours) at 01/20/2019 1140 Last data filed at 01/20/2019 0912 Gross per 24 hour  Intake 3300 ml    Output 850 ml  Net 2450 ml   Filed Weights   01/18/2019 1436 01/20/19 0710  Weight: 83.9 kg 83.9 kg    Exam:  General: Tired appearing patient in acute respiratory distress with respiratory rate of 6-40 times a minute with paradoxical respirations and accessory muscle use.  She sitting up at 70 degrees and O2 sats are 95% on high flow nasal cannula 9 L. Constitutional: Alert and awake CVS: Tachycardic, S1-S2  Respiratory: Patient in no acute respiratory distress is noted above.  On exam with diffuse crackles bilaterally. GI: NABS, soft, NT, ND, no palpable masses.  LE: No edema. No cyanosis  Assessment & Plan:   Acute hypoxic respiratory failure in the setting of COVID-19  Patient with Covid pneumonia/pneumonitis with mucous plugging. Prognosis was discussed with patient's daughter, patient was made DNR. She was seen by critical care medicine and not found to be a candidate for the ICU. We will continue treatment per pulmonary orders: -Supplemental O2 as needed to maintain O2 sats 86-92% -Allow for permissive hypoxia as long as patient asymptomatic -Continue IV steroids -Remdesivir  -Ceftriaxone day #1 -Vitamin C and zinc -As needed bronchodilators ( via MDI) -Encourage self proning as tolerated -Incentive spirometry   UTI Empiric ceftriaxone day #1 Urine culture is pending.  NSTEMI -Most likely secondary to demand ischemia given hypoxia, increased work of breathing and acidosis. Patient seen by Dr. Juliann Pares of cardiology who  recommended conservative cardiac management will continue plan per them: Heparin drip, ASA, oxygen Echocardiogram done and pending. Trend troponin until trending downward  AKI Likely multifactorial secondary to severe Covid infection as well as direct effects of SARS-CoV-2. Should improve with treatment of infection and gentle hydration as tolerated by Covid.  Lactic acidosis with sepsis We will treat with fluid resuscitation, albeit gentle  given Covid infection, and broad-spectrum antibiotics and treatment of Covid.  Multiple sclerosis Hold Avonex  Diabetes mellitus SSI with fingersticks AC at bedtime.  GERD Continue PPI      DVT prophylaxis: On heparin Code Status: DNR/DNI Family Communication: With Dr. Patsey Berthold and I spoke with daughter Stefanie Pham at length Disposition Plan: TBD   Consultants:  Pulmonary, Dr. Patsey Berthold  Cardiology, Dr. Clayborn Bigness  Procedures:  None  Antimicrobials:  Ceftriaxone  Remdesivir  Data Reviewed: Basic Metabolic Panel: Recent Labs  Lab 02-11-19 1448 01/20/19 0727  NA 133* 136  K 4.9 5.0  CL 93* 100  CO2 27 24  GLUCOSE 310* 199*  BUN 35* 36*  CREATININE 1.03* 1.05*  CALCIUM 9.1 8.7*   Liver Function Tests: Recent Labs  Lab 11-Feb-2019 1448 01/20/19 0727  AST 36 58*  ALT 17 24  ALKPHOS 83 106  BILITOT 0.5 0.4  PROT 8.3* 7.8  ALBUMIN 2.9* 2.8*   No results for input(s): LIPASE, AMYLASE in the last 168 hours. No results for input(s): AMMONIA in the last 168 hours. CBC: Recent Labs  Lab 02/11/2019 1448 01/20/19 0727  WBC 7.7 4.1  HGB 11.7* 11.2*  HCT 37.4 35.0*  MCV 100.0 98.9  PLT 198 222   Cardiac Enzymes: No results for input(s): CKTOTAL, CKMB, CKMBINDEX, TROPONINI in the last 168 hours. BNP (last 3 results) No results for input(s): PROBNP in the last 8760 hours. CBG: Recent Labs  Lab 02/11/19 2035 02-11-19 2302 01/20/19 0211 01/20/19 0531 01/20/19 0801  GLUCAP 240* 252* 266* 184* 191*    Recent Results (from the past 240 hour(s))  Blood Culture (routine x 2)     Status: None (Preliminary result)   Collection Time: Feb 11, 2019  3:20 PM   Specimen: BLOOD  Result Value Ref Range Status   Specimen Description BLOOD FINGER  Final   Special Requests   Final    BOTTLES DRAWN AEROBIC AND ANAEROBIC Blood Culture adequate volume   Culture   Final    NO GROWTH < 24 HOURS Performed at [facility], [address] [place], [place]  [address]    Report Status PENDING  Incomplete  Blood Culture (routine x 2)     Status: None (Preliminary result)   Collection Time: February 11, 2019  3:25 PM   Specimen: BLOOD  Result Value Ref Range Status   Specimen Description BLOOD RIGHT ANTECUBITAL  Final   Special Requests   Final    BOTTLES DRAWN AEROBIC AND ANAEROBIC Blood Culture adequate volume   Culture   Final    NO GROWTH < 24 HOURS Performed at [facility], [address] [place], [place] [address]    Report Status PENDING  Incomplete         Studies: CT ANGIO CHEST PE W OR WO CONTRAST  Result Date: 01/20/2019 CLINICAL DATA:  86 year old female with shortness of breath and altered mental status. EXAM: CT ANGIOGRAPHY CHEST WITH CONTRAST TECHNIQUE: Multidetector CT imaging of the chest was performed using the standard protocol during bolus administration of intravenous contrast. Multiplanar CT image reconstructions and MIPs were obtained to evaluate the vascular  anatomy. CONTRAST:  82mL OMNIPAQUE IOHEXOL 350 MG/ML SOLN COMPARISON:  Chest radiograph dated Feb 17, 2019. FINDINGS: Evaluation of this exam is limited due to respiratory motion artifact. Cardiovascular: There is moderate cardiomegaly. There is retrograde flow of contrast from the right atrium into the IVC in keeping with right heart dysfunction. No pericardial effusion. There is mild atherosclerotic calcification of the thoracic aorta. Mildly dilated main pulmonary trunk suggestive of a degree of pulmonary hypertension. Clinical correlation is recommended. Evaluation of the pulmonary arteries is very limited due to respiratory motion artifact. No definite large central pulmonary artery embolus identified. Mediastinum/Nodes: No definite hilar or mediastinal adenopathy. Evaluation however is limited due to consolidative changes of the perihilar lung. Esophagus is grossly unremarkable. No mediastinal fluid collection. Lungs/Pleura: Bilateral lower lobe patchy and streaky  airspace opacities as well as scattered clusters of ground-glass densities primarily in the right upper lobe most consistent with multifocal pneumonia. Clinical correlation and follow-up to resolution recommended. There is eventration of the right hemidiaphragm with probable associated compressive atelectasis of the right lung base. No pleural effusion or pneumothorax. There is mucous impaction of several right lower lobe bronchi. Aspiration not excluded. Upper Abdomen: No acute abnormality. Musculoskeletal: Degenerative changes of the spine. No acute osseous pathology. Review of the MIP images confirms the above findings. IMPRESSION: 1. Limited study due to respiratory motion artifact. No definite CT evidence of central pulmonary artery embolus. 2. Bilateral lower lobe patchy and streaky airspace opacities most consistent with multifocal pneumonia. Clinical correlation and follow-up to resolution recommended. 3. Moderate cardiomegaly with evidence of right heart dysfunction. 4. Mucous impaction of several right lower lobe bronchi. Aspiration not excluded. 5. Aortic Atherosclerosis (ICD10-I70.0). Electronically Signed   By: Elgie Collard M.D.   On: 01/20/2019 00:07   DG Chest Portable 1 View  Result Date: 02-17-19 CLINICAL DATA:  Hypoxia.  COVID positive. EXAM: PORTABLE CHEST 1 VIEW COMPARISON:  Chest x-ray dated April 11, 2018. FINDINGS: Mild cardiomegaly. Atherosclerotic calcification of the aortic arch. Normal pulmonary vascularity. New elevation of the right hemidiaphragm. Mild patchy bibasilar opacities. No pleural effusion or pneumothorax. No acute osseous abnormality. IMPRESSION: 1. Mild patchy bibasilar opacities, nonspecific, but concerning for atypical infection, including potential viral pneumonia. Electronically Signed   By: Obie Dredge M.D.   On: 02/17/19 15:57        Scheduled Meds:  ascorbic acid  500 mg Oral Daily   cholecalciferol  2,000 Units Oral Daily   dexamethasone  (DECADRON) injection  6 mg Intravenous Q24H   insulin aspart  0-9 Units Subcutaneous Q4H   ivermectin  200 mcg/kg Oral QODAY   methadone  5 mg Oral Q6H   metoprolol tartrate  12.5 mg Oral BID   zinc sulfate  220 mg Oral Daily   Continuous Infusions:  sodium chloride 75 mL/hr at 01/20/19 0858   cefTRIAXone (ROCEPHIN)  IV     doxycycline (VIBRAMYCIN) IV     famotidine (PEPCID) IV     heparin     remdesivir 100 mg in NS 100 mL Stopped (01/20/19 1100)   thiamine injection      Active Problems:   COVID-19   Acute on chronic respiratory failure with hypoxia (HCC)   COVID 19 PNA   ALTERED MENTAL STATUS/METABOLIC ENCEPHALOPATHY   MULTIPLE SCLEROSIS   H/O OM Left great toe, treated feb 2020   DYSPHAGIA/GERD  H/o esophageal dilations by dr Sharlet Salina Has been seen by speech and ba swallow in past   DM2   HTN  Pieter Partridge, MD, FACP, [facility]. [facility]  If 7PM-7AM, please contact night-coverage [email or web address] Password [email or web address] 01/20/2019, 11:40 AM    LOS: 1 day

## 2019-01-20 NOTE — Consult Note (Signed)
CARDIOLOGY CONSULT NOTE               Patient ID: Stefanie Pham MRN: [id number] DOB/AGE: 84-Sep-1935 84 y.o.  Admit date: 01/31/2019 Referring Physician Ernest Haber hospitalist Primary Physician Dr. Alda Ponder primary Primary Cardiologist none Reason for Consultation elevated troponins shortness of breath possible atrial fibrillation  HPI: Patient disease 86 year old female multiple medical problems include multiple sclerosis diabetes hypertension depression GERD with shortness of breath altered mental status reportedly had as COVID-19 with acute hypoxic respiratory failure requiring supplemental oxygen currently on 4 L possible UTI patient now lying in bed agitated confused possible dementia  Review of systems complete and found to be negative unless listed above     Past Medical History:  Diagnosis Date  . Diabetes mellitus without complication (HCC)   . Diverticulitis   . Hernia, inguinal   . Hypertension   . Multiple sclerosis (HCC)     Past Surgical History:  Procedure Laterality Date  . ABDOMINAL HYSTERECTOMY    . AMPUTATION TOE Left 03/11/2018   Procedure: AMPUTATION TOE LEFT GREAT TOE;  Surgeon: Linus Galas, DPM;  Location: [facility] ORS;  Service: Podiatry;  Laterality: Left;  . APPENDECTOMY    . CERVICAL SPINE SURGERY N/A   . CHOLECYSTECTOMY    . HERNIA REPAIR    . ORIF ANKLE FRACTURE Left   . REPLACEMENT TOTAL KNEE Left 2    (Not in a hospital admission)  Social History   Socioeconomic History  . Marital status: Married    Spouse name: Not on file  . Number of children: Not on file  . Years of education: Not on file  . Highest education level: Not on file  Occupational History  . Not on file  Tobacco Use  . Smoking status: Never Smoker  . Smokeless tobacco: Never Used  Substance and Sexual Activity  . Alcohol use: No  . Drug use: No  . Sexual activity: Not on file  Other Topics Concern  . Not on file  Social History Narrative  . Not on  file   Social Determinants of Health   Financial Resource Strain:   . Difficulty of Paying Living Expenses: Not on file  Food Insecurity:   . Worried About Programme researcher, broadcasting/film/video in the Last Year: Not on file  . Ran Out of Food in the Last Year: Not on file  Transportation Needs:   . Lack of Transportation (Medical): Not on file  . Lack of Transportation (Non-Medical): Not on file  Physical Activity:   . Days of Exercise per Week: Not on file  . Minutes of Exercise per Session: Not on file  Stress:   . Feeling of Stress : Not on file  Social Connections:   . Frequency of Communication with Friends and Family: Not on file  . Frequency of Social Gatherings with Friends and Family: Not on file  . Attends Religious Services: Not on file  . Active Member of Clubs or Organizations: Not on file  . Attends Banker Meetings: Not on file  . Marital Status: Not on file  Intimate Partner Violence:   . Fear of Current or Ex-Partner: Not on file  . Emotionally Abused: Not on file  . Physically Abused: Not on file  . Sexually Abused: Not on file    Family History  Problem Relation Age of Onset  . COPD Father       Review of systems complete and found to be negative unless listed  above      PHYSICAL EXAM  General: Well developed, well nourished, in no acute distress HEENT:  Normocephalic and atramatic Neck:  No JVD.  Lungs: Clear bilaterally to auscultation and percussion. Heart: Irregular irregular tachycardic. Normal S1 and S2 without gallops or 2/6 sem murmurs.  Abdomen: Bowel sounds are positive, abdomen soft and non-tender  Msk:  Back normal, normal gait. Normal strength and tone for age. Extremities: No clubbing, cyanosis or edema.   Neuro: Disoriented confused agitated. Psych:  Good affect, responds appropriately  Labs:   Lab Results  Component Value Date   WBC 4.1 01/20/2019   HGB 11.2 (L) 01/20/2019   HCT 35.0 (L) 01/20/2019   MCV 98.9 01/20/2019   PLT  222 01/20/2019    Recent Labs  Lab 02/02/2019 1448  NA 133*  K 4.9  CL 93*  CO2 27  BUN 35*  CREATININE 1.03*  CALCIUM 9.1  PROT 8.3*  BILITOT 0.5  ALKPHOS 83  ALT 17  AST 36  GLUCOSE 310*   No results found for: CKTOTAL, CKMB, CKMBINDEX, TROPONINI No results found for: CHOL No results found for: HDL No results found for: LDLCALC No results found for: TRIG No results found for: CHOLHDL No results found for: LDLDIRECT    Radiology: CT ANGIO CHEST PE W OR WO CONTRAST  Result Date: 01/20/2019 CLINICAL DATA:  86 year old female with shortness of breath and altered mental status. EXAM: CT ANGIOGRAPHY CHEST WITH CONTRAST TECHNIQUE: Multidetector CT imaging of the chest was performed using the standard protocol during bolus administration of intravenous contrast. Multiplanar CT image reconstructions and MIPs were obtained to evaluate the vascular anatomy. CONTRAST:  49mL OMNIPAQUE IOHEXOL 350 MG/ML SOLN COMPARISON:  Chest radiograph dated 01/15/2019. FINDINGS: Evaluation of this exam is limited due to respiratory motion artifact. Cardiovascular: There is moderate cardiomegaly. There is retrograde flow of contrast from the right atrium into the IVC in keeping with right heart dysfunction. No pericardial effusion. There is mild atherosclerotic calcification of the thoracic aorta. Mildly dilated main pulmonary trunk suggestive of a degree of pulmonary hypertension. Clinical correlation is recommended. Evaluation of the pulmonary arteries is very limited due to respiratory motion artifact. No definite large central pulmonary artery embolus identified. Mediastinum/Nodes: No definite hilar or mediastinal adenopathy. Evaluation however is limited due to consolidative changes of the perihilar lung. Esophagus is grossly unremarkable. No mediastinal fluid collection. Lungs/Pleura: Bilateral lower lobe patchy and streaky airspace opacities as well as scattered clusters of ground-glass densities primarily in  the right upper lobe most consistent with multifocal pneumonia. Clinical correlation and follow-up to resolution recommended. There is eventration of the right hemidiaphragm with probable associated compressive atelectasis of the right lung base. No pleural effusion or pneumothorax. There is mucous impaction of several right lower lobe bronchi. Aspiration not excluded. Upper Abdomen: No acute abnormality. Musculoskeletal: Degenerative changes of the spine. No acute osseous pathology. Review of the MIP images confirms the above findings. IMPRESSION: 1. Limited study due to respiratory motion artifact. No definite CT evidence of central pulmonary artery embolus. 2. Bilateral lower lobe patchy and streaky airspace opacities most consistent with multifocal pneumonia. Clinical correlation and follow-up to resolution recommended. 3. Moderate cardiomegaly with evidence of right heart dysfunction. 4. Mucous impaction of several right lower lobe bronchi. Aspiration not excluded. 5. Aortic Atherosclerosis (ICD10-I70.0). Electronically Signed   By: Anner Crete M.D.   On: 01/20/2019 00:07   DG Chest Portable 1 View  Result Date: 01/28/2019 CLINICAL DATA:  Hypoxia.  COVID positive.  EXAM: PORTABLE CHEST 1 VIEW COMPARISON:  Chest x-ray dated April 11, 2018. FINDINGS: Mild cardiomegaly. Atherosclerotic calcification of the aortic arch. Normal pulmonary vascularity. New elevation of the right hemidiaphragm. Mild patchy bibasilar opacities. No pleural effusion or pneumothorax. No acute osseous abnormality. IMPRESSION: 1. Mild patchy bibasilar opacities, nonspecific, but concerning for atypical infection, including potential viral pneumonia. Electronically Signed   By: Obie Dredge M.D.   On: 02/09/2019 15:57    EKG: Atrial fibrillation rapid ventricular response  ASSESSMENT AND PLAN:  Elevated troponin Respiratory failure COVID-19 Hypertension Multiple sclerosis Diabetes GERD Shortness of  breath . Plan Recommend short-term IV heparin therapy for anticoagulation Follow-up EKGs and troponins Echocardiogram available for assessment left ventricular function and valvular structures Continue respiratory supplementation with oxygen as needed Continue hypertension management Maintain diabetes management with Amaryl Lantus Consider broad-spectrum antibiotic therapy Agree with multiple sclerosis therapy Recommend conservative cardiac management  Signed: Alwyn Pea MD 01/20/2019, 8:27 AM

## 2019-01-21 LAB — URINE CULTURE: Culture: >=100000 — AB

## 2019-01-21 LAB — TROPONIN I (HIGH SENSITIVITY): Troponin I (High Sensitivity): 1654 ng/L (ref <18)

## 2019-01-21 MED ORDER — HYDROMORPHONE BOLUS VIA INFUSION
1.0000 mg | INTRAVENOUS | Status: DC | PRN
  Administered 2019-01-21 – 2019-01-22 (×7): 1 mg via INTRAVENOUS
  Filled 2019-01-21: qty 1

## 2019-01-21 MED ORDER — LORAZEPAM 2 MG/ML IJ SOLN
2.0000 mg | INTRAMUSCULAR | Status: DC | PRN
  Administered 2019-01-21: 3 mg via INTRAVENOUS
  Administered 2019-01-21: 2 mg via INTRAVENOUS
  Filled 2019-01-21: qty 1
  Filled 2019-01-21 (×2): qty 2

## 2019-01-21 MED ORDER — CHLORHEXIDINE GLUCONATE CLOTH 2 % EX PADS: 6.0000 | MEDICATED_PAD | Freq: Every day | CUTANEOUS | Status: DC

## 2019-01-21 MED ORDER — LORAZEPAM 2 MG/ML IJ SOLN
2.0000 mg | INTRAMUSCULAR | Status: DC | PRN
  Administered 2019-01-21 (×2): 2 mg via INTRAVENOUS
  Filled 2019-01-21 (×2): qty 1

## 2019-01-21 MED ORDER — GLYCOPYRROLATE 0.2 MG/ML IJ SOLN
0.4000 mg | INTRAMUSCULAR | Status: DC | PRN
  Filled 2019-01-21: qty 2

## 2019-01-21 MED ORDER — SODIUM CHLORIDE 0.9 % IV SOLN
0.5000 mg/h | INTRAVENOUS | Status: DC
  Administered 2019-01-21: 4 mg/h via INTRAVENOUS
  Administered 2019-01-21: 5 mg/h via INTRAVENOUS
  Administered 2019-01-21: 4 mg/h via INTRAVENOUS
  Administered 2019-01-21: 2 mg/h via INTRAVENOUS
  Administered 2019-01-21 – 2019-01-23 (×10): 6 mg/h via INTRAVENOUS
  Filled 2019-01-21 (×15): qty 2.5

## 2019-01-21 NOTE — Progress Notes (Signed)
Pt's daughter [name] is at bedside, have discussed her mother's worsening work of breathing and poor prognosis.  She reports that she doesn't want to see her mother suffer, and would like to transition to comfort care measures only.  Will place pt on Dilaudid drip along with prn Ativan pushes.  DNR/DNI, Nurse may pronounce.  Chaplain at bedside to offer support.    Harlon Ditty, AGACNP-BC [place] [facility] Pager: [phone]

## 2019-01-21 NOTE — Progress Notes (Signed)
   01/21/19 0200  Clinical Encounter Type  Visited With Family;Health care provider  Visit Type Initial  Referral From Nurse;Physician  Spiritual Encounters  Spiritual Needs Prayer;Emotional;Grief support  Stress Factors  Family Stress Factors Health changes;Loss;Major life changes   Referral received to support the family of Mrs. Stefanie Pham in the wake of her decline and preparation to move to comfort measures. Upon arrival, two of the patient's adult children were at the bedside; oldest daughter seated outside awaiting her opportunity to be bedside. This chaplain offered support in the form of compassionate ministerial presence, prayer, words of comfort and peace, and active listening. One additional family member arrived who accompanied the other waiting daughter into the room. The family seems to be very supportive of one another and attest to being people of strong Christian faith. Of note, the patient's son and daughters shared that they also recently lost their father and their grief is unbearable. The family's foremost concern is for the patient's comfort and they wish that everything be done to make her as comfortable as possible. Medications administered to help with this. Discussions had with the bedside nurse, NP, and AC around these wishes and possibilities of at least one person being allowed to return to their mother's bedside so that she will not die alone. [facility] will be back in touch with the family about this request later this morning. The family expressed gratitude for the care and support given to them and the patient.

## 2019-01-21 NOTE — Care Plan (Signed)
Patient has been transitioned to comfort care.  She has now been transferred to MedSurg status.  Family has been allowed to visit.  Multidisciplinary rounds were performed with ICU/SDU team and no further recommendations were generated.  PCCM will sign off please reconsult as needed.  Gailen Shelter, MD [place] [facility]

## 2019-01-21 NOTE — Progress Notes (Signed)
Pt is struggling to breathe, RR 40-50, shallow respirations with assessory muscle use.  SpO2 sats range from 88 to 92% on HFNC.  In review of pt's Echo, she is noted to have EF <20% from NSTEMI contributing to her work of breathing.  She remains Encephalopathic.    Called and updated pt's daughter [name] of her mother's declining respiratory status.  Per previous discussion with Dr. Jayme Cloud, Marylene Land would like to focus more on her mother's comfort if her respiratory status worsened.  Marylene Land and her sister are going to come to bedside, and will then make decision regarding comfort care status.    Will again discuss with family upon their arrival.     Harlon Ditty, [facility] [place] [facility] Pager: [phone]

## 2019-01-21 NOTE — Progress Notes (Signed)
Patient is resting well in her bed.  She has been placed on comfort care.  Two family members have been approved to sit with the patient for her passing.  Patient does not respond to voice or stimulation but does respond to pain when turning.  Providing minimal turning care to not cause any disruption to patients sleep.  She does not seem to be in pain at this time.  RR approx. 21.

## 2019-01-21 NOTE — Progress Notes (Signed)
PROGRESS NOTE    Stefanie Pham  TMA:[id number]  DOB: 12/28/1933  DOA: 02/05/2019 PCP: Verner Mould, MD Outpatient Specialists:   Hospital course:  Patient is an 86 year old female with past medical history significant for multiple sclerosis, rheumatoid arthritis, DM 2, HTN, depression, esophageal stricture status post dilatation and GERD who was admitted 02/13/2019 complaining of "shortness of breath for a long time".  Evaluation in the ED revealed COVID-19 positivity and chest x-ray revealed mild patchy bibasilar opacities concerning for atypical infection.  She was initially needing oxygen 4 L by nasal cannula however over the course of the evening she became increasingly dyspneic and agitated and was increased to high flow nasal cannula 9 L O2.  Over the course of the night patient was noted to have increased troponin thought to be secondary demand ischemia.  She was started on remdesivir, steroids and as needed albuterol.  CT angiogram was negative for PE and did show multifocal pneumonia with mucous plugging.  After deterioration, PCCM was consulted for admission to ICU and Dr. Jayme Cloud spoke with patient's daughter on the phone.  Patient's daughter noted she did not want her mother to suffer and patient was made DNR.  Patient is to be admitted to stepdown.  On further conversation with the family, they do not want patient transferred to [facility].  Patient continued to deteriorate.  Echocardiogram reveals patient has EF of 20%.  Further discussion with family has resulted in patient being transitioned to comfort care only.  Daughters were able to come in and say provide her.  Patient is on Dilaudid drip.  Subjective:  Patient unable to provide history at this point.  She continues to be tachypneic although is sedated and sleeping.  Objective: Vitals:   01/21/19 1500 01/21/19 1525 01/21/19 1600 01/21/19 1657  BP:      Pulse: (!) 59 60 60 62  Resp: 11 11 (!) 9 11  Temp:       TempSrc:      SpO2: 92% 93% 92% 92%  Weight:      Height:        Intake/Output Summary (Last 24 hours) at 01/21/2019 1808 Last data filed at 01/21/2019 1600 Gross per 24 hour  Intake 2168.66 ml  Output 100 ml  Net 2068.66 ml   Filed Weights   01/29/2019 1436 01/20/19 0710 01/20/19 1847  Weight: 83.9 kg 83.9 kg 78.2 kg    Exam:  General: Tachypneic, sedated patient with shallow paradoxical respirations.  Assessment & Plan:   Acute hypoxic respiratory failure in the setting of COVID-19  Patient has been transitioned to comfort care and is on Dilaudid drip at present. Remains tachypneic although is sedated. Continue oxygen for comfort.  UTI Ceftriaxone has been discontinued. Patient on comfort care alone.  NSTEMI Echocardiogram reveals EF of 20% which appears to be new. Patient is on comfort care, no further treatment will be attempted.  AKI No longer following creatinine.  Multiple sclerosis Hold Avonex  Diabetes mellitus We are no longer following blood glucose.   DVT prophylaxis: On comfort care alone. Code Status: DNR/DNI Family Communication: With Dr. Jayme Cloud and I spoke with daughter [name] multiple times.  Family was allowed to come in and take about another. Disposition Plan: TBD   Consultants:  Pulmonary, Dr. Jayme Cloud  Cardiology, Dr. Juliann Pares  Procedures:  None  Antimicrobials:  Ceftriaxone  Remdesivir  Data Reviewed: Basic Metabolic Panel: Recent Labs  Lab 02/04/2019 1448 01/20/19 0727  NA 133* 136  K 4.9 5.0  CL 93* 100  CO2 27 24  GLUCOSE 310* 199*  BUN 35* 36*  CREATININE 1.03* 1.05*  CALCIUM 9.1 8.7*   Liver Function Tests: Recent Labs  Lab 02/08/2019 1448 01/20/19 0727  AST 36 58*  ALT 17 24  ALKPHOS 83 106  BILITOT 0.5 0.4  PROT 8.3* 7.8  ALBUMIN 2.9* 2.8*   No results for input(s): LIPASE, AMYLASE in the last 168 hours. No results for input(s): AMMONIA in the last 168 hours. CBC: Recent Labs  Lab  01/25/2019 1448 01/20/19 0727  WBC 7.7 4.1  HGB 11.7* 11.2*  HCT 37.4 35.0*  MCV 100.0 98.9  PLT 198 222   Cardiac Enzymes: No results for input(s): CKTOTAL, CKMB, CKMBINDEX, TROPONINI in the last 168 hours. BNP (last 3 results) No results for input(s): PROBNP in the last 8760 hours. CBG: Recent Labs  Lab 01/20/19 1242 01/20/19 1541 01/20/19 1830 01/20/19 1935 01/20/19 2313  GLUCAP 174* 175* 174* 147* 177*    Recent Results (from the past 240 hour(s))  Blood Culture (routine x 2)     Status: None (Preliminary result)   Collection Time: 02/12/2019  3:20 PM   Specimen: BLOOD  Result Value Ref Range Status   Specimen Description BLOOD FINGER  Final   Special Requests   Final    BOTTLES DRAWN AEROBIC AND ANAEROBIC Blood Culture adequate volume   Culture   Final    NO GROWTH 2 DAYS Performed at [facility], [address] [place], [place] [address]    Report Status PENDING  Incomplete  Urine culture     Status: Abnormal   Collection Time: 01/25/2019  3:20 PM   Specimen: Urine, Random  Result Value Ref Range Status   Specimen Description   Final    URINE, RANDOM Performed at [facility], [address] [place], [place] [address]    Special Requests   Final    NONE Performed at [facility], [address] [place], [place] [address]    Culture >=100,000 COLONIES/mL KLEBSIELLA OXYTOCA (A)  Final   Report Status 01/21/2019 FINAL  Final   Organism ID, Bacteria KLEBSIELLA OXYTOCA (A)  Final      Susceptibility   Klebsiella oxytoca - MIC*    AMPICILLIN >=32 RESISTANT Resistant     CEFAZOLIN >=64 RESISTANT Resistant     CEFTRIAXONE >=64 RESISTANT Resistant     CIPROFLOXACIN <=0.25 SENSITIVE Sensitive     GENTAMICIN <=1 SENSITIVE Sensitive     IMIPENEM <=0.25 SENSITIVE Sensitive     NITROFURANTOIN 32 SENSITIVE Sensitive     TRIMETH/SULFA <=20 SENSITIVE Sensitive     AMPICILLIN/SULBACTAM >=32 RESISTANT Resistant     PIP/TAZO >=128 RESISTANT  Resistant     * >=100,000 COLONIES/mL KLEBSIELLA OXYTOCA  Blood Culture (routine x 2)     Status: None (Preliminary result)   Collection Time: 02/11/2019  3:25 PM   Specimen: BLOOD  Result Value Ref Range Status   Specimen Description BLOOD RIGHT ANTECUBITAL  Final   Special Requests   Final    BOTTLES DRAWN AEROBIC AND ANAEROBIC Blood Culture adequate volume   Culture   Final    NO GROWTH 2 DAYS Performed at [facility], [address], [place], [place] [address]    Report Status PENDING  Incomplete  MRSA PCR Screening     Status: None   Collection Time: 01/20/19  6:51 PM   Specimen: Nasopharyngeal  Result Value Ref Range Status  MRSA by PCR NEGATIVE NEGATIVE Final    Comment:        The GeneXpert MRSA Assay (FDA approved for NASAL specimens only), is one component of a comprehensive MRSA colonization surveillance program. It is not intended to diagnose MRSA infection nor to guide or monitor treatment for MRSA infections. Performed at [facility], [address] [place], [place] [address]          Studies: CT ANGIO CHEST PE W OR WO CONTRAST  Result Date: 01/20/2019 CLINICAL DATA:  86 year old female with shortness of breath and altered mental status. EXAM: CT ANGIOGRAPHY CHEST WITH CONTRAST TECHNIQUE: Multidetector CT imaging of the chest was performed using the standard protocol during bolus administration of intravenous contrast. Multiplanar CT image reconstructions and MIPs were obtained to evaluate the vascular anatomy. CONTRAST:  34mL OMNIPAQUE IOHEXOL 350 MG/ML SOLN COMPARISON:  Chest radiograph dated 02-08-2019. FINDINGS: Evaluation of this exam is limited due to respiratory motion artifact. Cardiovascular: There is moderate cardiomegaly. There is retrograde flow of contrast from the right atrium into the IVC in keeping with right heart dysfunction. No pericardial effusion. There is mild atherosclerotic calcification of the thoracic aorta. Mildly  dilated main pulmonary trunk suggestive of a degree of pulmonary hypertension. Clinical correlation is recommended. Evaluation of the pulmonary arteries is very limited due to respiratory motion artifact. No definite large central pulmonary artery embolus identified. Mediastinum/Nodes: No definite hilar or mediastinal adenopathy. Evaluation however is limited due to consolidative changes of the perihilar lung. Esophagus is grossly unremarkable. No mediastinal fluid collection. Lungs/Pleura: Bilateral lower lobe patchy and streaky airspace opacities as well as scattered clusters of ground-glass densities primarily in the right upper lobe most consistent with multifocal pneumonia. Clinical correlation and follow-up to resolution recommended. There is eventration of the right hemidiaphragm with probable associated compressive atelectasis of the right lung base. No pleural effusion or pneumothorax. There is mucous impaction of several right lower lobe bronchi. Aspiration not excluded. Upper Abdomen: No acute abnormality. Musculoskeletal: Degenerative changes of the spine. No acute osseous pathology. Review of the MIP images confirms the above findings. IMPRESSION: 1. Limited study due to respiratory motion artifact. No definite CT evidence of central pulmonary artery embolus. 2. Bilateral lower lobe patchy and streaky airspace opacities most consistent with multifocal pneumonia. Clinical correlation and follow-up to resolution recommended. 3. Moderate cardiomegaly with evidence of right heart dysfunction. 4. Mucous impaction of several right lower lobe bronchi. Aspiration not excluded. 5. Aortic Atherosclerosis (ICD10-I70.0). Electronically Signed   By: Elgie Collard M.D.   On: 01/20/2019 00:07   ECHOCARDIOGRAM COMPLETE  Result Date: 01/20/2019   ECHOCARDIOGRAM REPORT   Patient Name:   Stefanie Pham Date of Exam: 01/20/2019 Medical Rec #:  [id number]         Height:       66.0 in Accession #:    [id number]         Weight:       185.0 lb Date of Birth:  08/11/1933         BSA:          1.93 m Patient Age:    85 years          BP:           134/87 mmHg Patient Gender: F                 HR:           104 bpm. Exam Location:  [facility] Procedure: 2D  Echo, Cardiac Doppler and Limited Color Doppler Indications:     I21.4 NSTEMI  History:         Patient has prior history of Echocardiogram examinations, most                  recent 04/13/2018. Risk Factors:Hypertension and Diabetes. Pt                  tested positive for COVID19.  Sonographer:     Humphrey Rolls RDCS (AE) Referring Phys:  [id number] BRENDA MORRISON Diagnosing Phys: Alwyn Pea MD  Sonographer Comments: No apical window and suboptimal subcostal window. Image acquisition challenging due to uncooperative patient. IMPRESSIONS  1. Left ventricular ejection fraction, by visual estimation, is <20%. The left ventricle has severely decreased function. Left ventricular septal wall thickness was normal. Normal left ventricular posterior wall thickness. There is no left ventricular hypertrophy.  2. Dilated cardiomyopathy.  3. Large anterior, anteroseptal, apical, anteroapical and lateral myocardial infarction.  4. Left ventricular diastolic parameters are consistent with Grade I diastolic dysfunction (impaired relaxation).  5. Moderate to severely dilated left ventricular internal cavity size.  6. Right ventricular volume overload.  7. The left ventricle demonstrates global hypokinesis.  8. Global right ventricle has severely reduced systolic function.The right ventricular size is severely enlarged. No increase in right ventricular wall thickness.  9. Left atrial size was mildly dilated. 10. Right atrial size was mildly dilated. 11. The mitral valve is normal in structure. Mild mitral valve regurgitation. 12. The tricuspid valve is normal in structure. 13. The aortic valve is grossly normal. Aortic valve regurgitation is not visualized. Mild aortic valve sclerosis without stenosis.  14. The pulmonic valve was grossly normal. Pulmonic valve regurgitation is not visualized. 15. Mildly elevated pulmonary artery systolic pressure. FINDINGS  Left Ventricle: Left ventricular ejection fraction, by visual estimation, is <20%. The left ventricle has severely decreased function. The left ventricle demonstrates global hypokinesis. The left ventricular internal cavity size was moderately to severely dilated left ventricle. Normal left ventricular posterior wall thickness. There is no left ventricular hypertrophy. The interventricular septum is flattened in diastole ('D' shaped left ventricle), consistent with right ventricular volume overload. Left ventricular diastolic parameters are consistent with Grade I diastolic dysfunction (impaired relaxation). There is evidence of a large anterior, anteroseptal, apical, anteroapical and lateral myocardial infarction. Findings are consistent with dilated cardiomyopathy. Right Ventricle: The right ventricular size is severely enlarged. No increase in right ventricular wall thickness. Global RV systolic function is has severely reduced systolic function. The tricuspid regurgitant velocity is 2.62 m/s, and with an assumed right atrial pressure of 10 mmHg, the estimated right ventricular systolic pressure is mildly elevated at 37.5 mmHg. Left Atrium: Left atrial size was mildly dilated. Right Atrium: Right atrial size was mildly dilated Pericardium: There is no evidence of pericardial effusion. Mitral Valve: The mitral valve is normal in structure. Mild mitral valve regurgitation. MV peak gradient, 4.8 mmHg. Tricuspid Valve: The tricuspid valve is normal in structure. Tricuspid valve regurgitation is mild. Aortic Valve: The aortic valve is grossly normal. Aortic valve regurgitation is not visualized. Mild aortic valve sclerosis is present, with no evidence of aortic valve stenosis. Aortic valve mean gradient measures 2.0 mmHg. Aortic valve peak gradient measures 5.0  mmHg. Aortic valve area, by VTI measures 1.77 cm. Pulmonic Valve: The pulmonic valve was grossly normal. Pulmonic valve regurgitation is not visualized. Pulmonic regurgitation is not visualized. Aorta: The aortic root is normal in size and structure. IAS/Shunts: No  atrial level shunt detected by color flow Doppler.  LEFT VENTRICLE PLAX 2D LVIDd:         5.22 cm LVIDs:         5.07 cm LV PW:         0.93 cm LV IVS:        0.63 cm LVOT diam:     2.10 cm LV SV:         9 ml LV SV Index:   4.27 LVOT Area:     3.46 cm  LEFT ATRIUM         Index LA diam:    3.20 cm 1.65 cm/m  AORTIC VALVE                   PULMONIC VALVE AV Area (Vmax):    1.46 cm    PV Vmax:       0.83 m/s AV Area (Vmean):   1.66 cm    PV Vmean:      55.700 cm/s AV Area (VTI):     1.77 cm    PV VTI:        0.099 m AV Vmax:           112.00 cm/s PV Peak grad:  2.7 mmHg AV Vmean:          65.900 cm/s PV Mean grad:  1.0 mmHg AV VTI:            0.133 m AV Peak Grad:      5.0 mmHg AV Mean Grad:      2.0 mmHg LVOT Vmax:         47.10 cm/s LVOT Vmean:        31.500 cm/s LVOT VTI:          0.068 m LVOT/AV VTI ratio: 0.51  AORTA Ao Root diam: 3.50 cm MITRAL VALVE                       TRICUSPID VALVE MV Area (PHT): 2.78 cm            TR Peak grad:   27.5 mmHg MV Peak grad:  4.8 mmHg            TR Vmax:        262.00 cm/s MV Mean grad:  3.0 mmHg MV Vmax:       1.09 m/s            SHUNTS MV Vmean:      71.9 cm/s           Systemic VTI:  0.07 m MV VTI:        0.16 m              Systemic Diam: 2.10 cm MV PHT:        79.17 msec MV Decel Time: 273 msec MV E velocity: 79.05 cm/s 103 cm/s  Dwayne Salome Arnt MD Electronically signed by Alwyn Pea MD Signature Date/Time: 01/20/2019/12:09:36 PM    Final         Scheduled Meds: . Chlorhexidine Gluconate Cloth  6 each Topical Daily  . mouth rinse  15 mL Mouth Rinse BID  . methadone  5 mg Oral Q6H   Continuous Infusions: . HYDROmorphone 4 mg/hr (01/21/19 1600)    Active Problems:   COVID-19    Acute on chronic respiratory failure with hypoxia (HCC)   COVID 19 PNA   ALTERED MENTAL STATUS/METABOLIC ENCEPHALOPATHY   MULTIPLE  SCLEROSIS   H/O OM Left great toe, treated feb 2020   DYSPHAGIA/GERD  H/o esophageal dilations by dr Bailey Mech Has been seen by speech and ba swallow in past   DM2   HTN         Vashti Hey, MD, FACP, [facility]. [facility]  If 7PM-7AM, please contact night-coverage [email or web address] Password [email or web address] 01/21/2019, 6:08 PM    LOS: 2 days

## 2019-01-22 DIAGNOSIS — Z515 Encounter for palliative care: Secondary | ICD-10-CM

## 2019-01-22 NOTE — TOC Progression Note (Signed)
Transition of Care [facility]) - Progression Note    Patient Details  Name: Deshanti Adcox MRN: [id number] Date of Birth: 03-24-1933  Transition of Care [facility]) CM/SW Contact  Trenton Founds, RN Phone Number: 01/22/2019, 10:33 AM  Clinical Narrative:     Patient continues to decline remains on Dilaudid drip. Per ICU charge nurse patient is being kept in ICU so that family may remain with patient. Reached out to MD via secure chat and she reported she would speak with family at some point today.          Expected Discharge Plan and Services                                                 Social Determinants of Health (SDOH) Interventions    Readmission Risk Interventions No flowsheet data found.

## 2019-01-22 NOTE — Progress Notes (Signed)
PROGRESS NOTE    Stefanie Pham  CHE:[id number]  DOB: 1933-08-06  DOA: 26-Jan-2019 PCP: Verner Mould, MD Outpatient Specialists:   Hospital course:  Patient is an 86 year old female with past medical history significant for multiple sclerosis, rheumatoid arthritis, DM 2, HTN, depression, esophageal stricture status post dilatation and GERD who was admitted 01/26/19 complaining of "shortness of breath for a long time".  Evaluation in the ED revealed COVID-19 positivity and chest x-ray revealed mild patchy bibasilar opacities concerning for atypical infection.  She was initially needing oxygen 4 L by nasal cannula however over the course of the evening she became increasingly dyspneic and agitated and was increased to high flow nasal cannula 9 L O2.  Over the course of the night patient was noted to have increased troponin thought to be secondary demand ischemia.  She was started on remdesivir, steroids and as needed albuterol.  CT angiogram was negative for PE and did show multifocal pneumonia with mucous plugging.  After deterioration, PCCM was consulted for admission to ICU and Dr. Jayme Cloud spoke with patient's daughter on the phone.  Patient's daughter noted she did not want her mother to suffer and patient was made DNR.  Patient is to be admitted to stepdown.  On further conversation with the family, they do not want patient transferred to [facility].  Patient continued to deteriorate.  Echocardiogram reveals patient has EF of 20%.  Further discussion with family has resulted in patient being transitioned to comfort care only.  Family has been able to come in and say goodbye to her.  Patient is on Dilaudid drip.  Subjective:  Discussed patient at length with patient's son and daughter.  We discussed end-of-life care.  Daughters main concern is that patient not suffer and that she not feel that she cannot get enough air in.  She does not want her mother to be on morphine as she is  allergic to morphine.  We discussed potentially taking her off oxygen if we could make sure she did not have air hunger with IV narcotics.  Patient's daughter and son wanted to discuss and pray about it and will let us know how they want to proceed.  In the meanwhile they are very appreciative of the care that their mother has been getting and that they have been allowed to come in and sit vigil with her.  Objective: Vitals:   01/22/19 1100 01/22/19 1200 01/22/19 1300 01/22/19 1400  BP:      Pulse: 85 82 80 78  Resp: 11 10 10 11   Temp:      TempSrc:      SpO2: (!) 89% 95% 95% 96%  Weight:      Height:        Intake/Output Summary (Last 24 hours) at 01/22/2019 1744 Last data filed at 01/22/2019 0300 Gross per 24 hour  Intake 127.13 ml  Output --  Net 127.13 ml   Filed Weights   26-Jan-2019 1436 01/20/19 0710 01/20/19 1847  Weight: 83.9 kg 83.9 kg 78.2 kg    Exam:  General: Tachypneic, sedated patient with shallow paradoxical respirations.  Assessment & Plan:   End of life care Patient is on diltiazem drip and on oxygen per family request. Would recommend discontinuing oxygen, family will consider and let 03/20/19 know their decision. If we do discontinue oxygen would treat acute air hunger with as needed Dilaudid and Ativan as needed. Son and daughter are at peace with this decision making especially since she  lost her husband of 62 years less than 6 months ago.  They feel that she is going to go be with him.  Acute hypoxic respiratory failure in the setting of COVID-19  Patient has been transitioned to comfort care and is on Dilaudid drip at present. Remains tachypneic although is sedated. Continue oxygen for comfort.  UTI Ceftriaxone has been discontinued. Patient on comfort care alone.  NSTEMI Echocardiogram reveals EF of 20% which appears to be new. Patient is on comfort care, no further treatment will be attempted.  AKI No longer following creatinine.  Multiple  sclerosis Hold Avonex  Diabetes mellitus We are no longer following blood glucose.   DVT prophylaxis: On comfort care alone. Code Status: DNR/DNI Family Communication: With Dr. Jayme Cloud and I spoke with daughter Stefanie Pham multiple times.  Family was allowed to come in and take about another. Disposition Plan: TBD   Consultants:  Pulmonary, Dr. Jayme Cloud  Cardiology, Dr. Juliann Pares  Procedures:  None  Antimicrobials:  Ceftriaxone  Remdesivir  Data Reviewed: Basic Metabolic Panel: Recent Labs  Lab 01-24-19 1448 01/20/19 0727  NA 133* 136  K 4.9 5.0  CL 93* 100  CO2 27 24  GLUCOSE 310* 199*  BUN 35* 36*  CREATININE 1.03* 1.05*  CALCIUM 9.1 8.7*   Liver Function Tests: Recent Labs  Lab 2019-01-24 1448 01/20/19 0727  AST 36 58*  ALT 17 24  ALKPHOS 83 106  BILITOT 0.5 0.4  PROT 8.3* 7.8  ALBUMIN 2.9* 2.8*   No results for input(s): LIPASE, AMYLASE in the last 168 hours. No results for input(s): AMMONIA in the last 168 hours. CBC: Recent Labs  Lab 01-24-2019 1448 01/20/19 0727  WBC 7.7 4.1  HGB 11.7* 11.2*  HCT 37.4 35.0*  MCV 100.0 98.9  PLT 198 222   Cardiac Enzymes: No results for input(s): CKTOTAL, CKMB, CKMBINDEX, TROPONINI in the last 168 hours. BNP (last 3 results) No results for input(s): PROBNP in the last 8760 hours. CBG: Recent Labs  Lab 01/20/19 1242 01/20/19 1541 01/20/19 1830 01/20/19 1935 01/20/19 2313  GLUCAP 174* 175* 174* 147* 177*    Recent Results (from the past 240 hour(s))  Blood Culture (routine x 2)     Status: None (Preliminary result)   Collection Time: 01-24-19  3:20 PM   Specimen: BLOOD  Result Value Ref Range Status   Specimen Description BLOOD FINGER  Final   Special Requests   Final    BOTTLES DRAWN AEROBIC AND ANAEROBIC Blood Culture adequate volume   Culture   Final    NO GROWTH 3 DAYS Performed at [facility], [address], [place], [place] [address]    Report Status PENDING  Incomplete   Urine culture     Status: Abnormal   Collection Time: 24-Jan-2019  3:20 PM   Specimen: Urine, Random  Result Value Ref Range Status   Specimen Description   Final    URINE, RANDOM Performed at [facility], [address] [place], [place] [address]    Special Requests   Final    NONE Performed at [facility], [address] [place], [place] [address]    Culture >=100,000 COLONIES/mL KLEBSIELLA OXYTOCA (A)  Final   Report Status 01/21/2019 FINAL  Final   Organism ID, Bacteria KLEBSIELLA OXYTOCA (A)  Final      Susceptibility   Klebsiella oxytoca - MIC*    AMPICILLIN >=32 RESISTANT Resistant     CEFAZOLIN >=64 RESISTANT Resistant  CEFTRIAXONE >=64 RESISTANT Resistant     CIPROFLOXACIN <=0.25 SENSITIVE Sensitive     GENTAMICIN <=1 SENSITIVE Sensitive     IMIPENEM <=0.25 SENSITIVE Sensitive     NITROFURANTOIN 32 SENSITIVE Sensitive     TRIMETH/SULFA <=20 SENSITIVE Sensitive     AMPICILLIN/SULBACTAM >=32 RESISTANT Resistant     PIP/TAZO >=128 RESISTANT Resistant     * >=100,000 COLONIES/mL KLEBSIELLA OXYTOCA  Blood Culture (routine x 2)     Status: None (Preliminary result)   Collection Time: 01/26/2019  3:25 PM   Specimen: BLOOD  Result Value Ref Range Status   Specimen Description BLOOD RIGHT ANTECUBITAL  Final   Special Requests   Final    BOTTLES DRAWN AEROBIC AND ANAEROBIC Blood Culture adequate volume   Culture   Final    NO GROWTH 3 DAYS Performed at [facility], [address] [place], [place] [address]    Report Status PENDING  Incomplete  MRSA PCR Screening     Status: None   Collection Time: 01/20/19  6:51 PM   Specimen: Nasopharyngeal  Result Value Ref Range Status   MRSA by PCR NEGATIVE NEGATIVE Final    Comment:        The GeneXpert MRSA Assay (FDA approved for NASAL specimens only), is one component of a comprehensive MRSA colonization surveillance program. It is not intended to diagnose MRSA infection nor to guide  or monitor treatment for MRSA infections. Performed at [facility], [address] [place], [place] [address]          Studies: No results found.      Scheduled Meds: . Chlorhexidine Gluconate Cloth  6 each Topical Daily  . mouth rinse  15 mL Mouth Rinse BID  . methadone  5 mg Oral Q6H   Continuous Infusions: . HYDROmorphone 6 mg/hr (01/22/19 1342)    Active Problems:   COVID-19   Acute on chronic respiratory failure with hypoxia (HCC)   COVID 19 PNA   ALTERED MENTAL STATUS/METABOLIC ENCEPHALOPATHY   MULTIPLE SCLEROSIS   H/O OM Left great toe, treated feb 2020   DYSPHAGIA/GERD  H/o esophageal dilations by dr Bailey Mech Has been seen by speech and ba swallow in past   DM2   HTN         Vashti Hey, MD, FACP, [facility]. [facility]  If 7PM-7AM, please contact night-coverage [email or web address] Password [email or web address] 01/22/2019, 5:44 PM    LOS: 3 days

## 2019-01-24 LAB — CULTURE, BLOOD (ROUTINE X 2)
Culture: NO GROWTH
Culture: NO GROWTH
Special Requests: ADEQUATE
Special Requests: ADEQUATE

## 2019-02-15 NOTE — Plan of Care (Signed)

## 2019-02-15 NOTE — Progress Notes (Addendum)
Performed mouth care. Patient still unresponsive. Continuing comfort care. Family at bedside.   Update 1710: MD paged to notify of patient's death.   Update 1747: [facility] called- reference #  [phone] spoke with Hurshel Party.   Update 1849: Daughter called to notify that patient's partials and change from food trays can be picked up anytime. Per daughter they will pick it up sometime tomorrow. Note put on bag. Ring was not able to come off patient, will be going down with patient to the morgue. Daughter also notified of this.

## 2019-02-15 NOTE — Death Summary Note (Signed)
[facility] Death Note                                                                                                                                                                                               Stefanie Pham, is a 86 y.o. female, DOB - [birth year], IRC:[id number]  Admit date - 01/26/2019   Admitting Physician Hannah Beat, MD  Outpatient Primary MD for the patient is Verner Mould, MD  LOS - 4  Chief Complaint  Patient presents with  . Shortness of Breath  . Chest Pain       Notification: Verner Mould, MD notified of death of 27-Jan-2019   Date and Time of Death -   Pronounced by -   History of present illness:   Stefanie Pham is a 86 y.o. female with past medical history significant for multiple sclerosis, rheumatoid arthritis, DM 2, HTN, depression, esophageal stricture status post dilatation and GERD who was admitted January 23, 2019 complaining of "shortness of breath for a long time".  Evaluation in the ED revealed COVID-19 positivity and chest x-ray revealed mild patchy bibasilar opacities concerning for atypical infection.  She was initially needing oxygen 4 L by nasal cannula however over the course of the evening she became increasingly dyspneic and agitated and was increased to high flow nasal cannula 9 L O2.  Over the course of the night patient was noted to have increased troponin thought to be secondary demand ischemia.  She was started on remdesivir, steroids and as needed albuterol.  CT angiogram was negative for PE and did show multifocal pneumonia with mucous plugging.  After deterioration, PCCM was consulted for admission to ICU and Dr. Jayme Cloud spoke with patient's daughter on the phone.  Patient's daughter noted she did not want her mother to suffer and patient was made DNR.  Patient is to be admitted to stepdown.  On further conversation with the family, they do not want patient transferred to [facility].  Patient continued  to deteriorate.  Echocardiogram revealed patient has EF of 20%.  Further discussion with family has resulted in patient being transitioned to comfort care only.  She was placed on Dilaudid drip with as needed Ativan and Dilaudid for comfort and to avoid air hunger.  Patient remained on oxygen per family request.   Family was able to come in and be with her as she passed.   Final Diagnoses:  Cause if death -respiratory failure  Signature  Stefanie Pham M.D on January 27, 2019 at 6:35 PM  [facility]  Office Phone [phone]  Total clinical and documentation time for today  Under 30 minutes   Last Note

## 2019-02-15 DEATH — deceased

## 2021-03-22 IMAGING — DX DG CHEST 1V PORT
1 series · 1 of 1 positions shown · non-contrast
Comparison: Chest x-ray dated April 11, 2018.

CLINICAL DATA: Hypoxia.  COVID positive.

EXAM:
PORTABLE CHEST 1 VIEW

[chest ap]
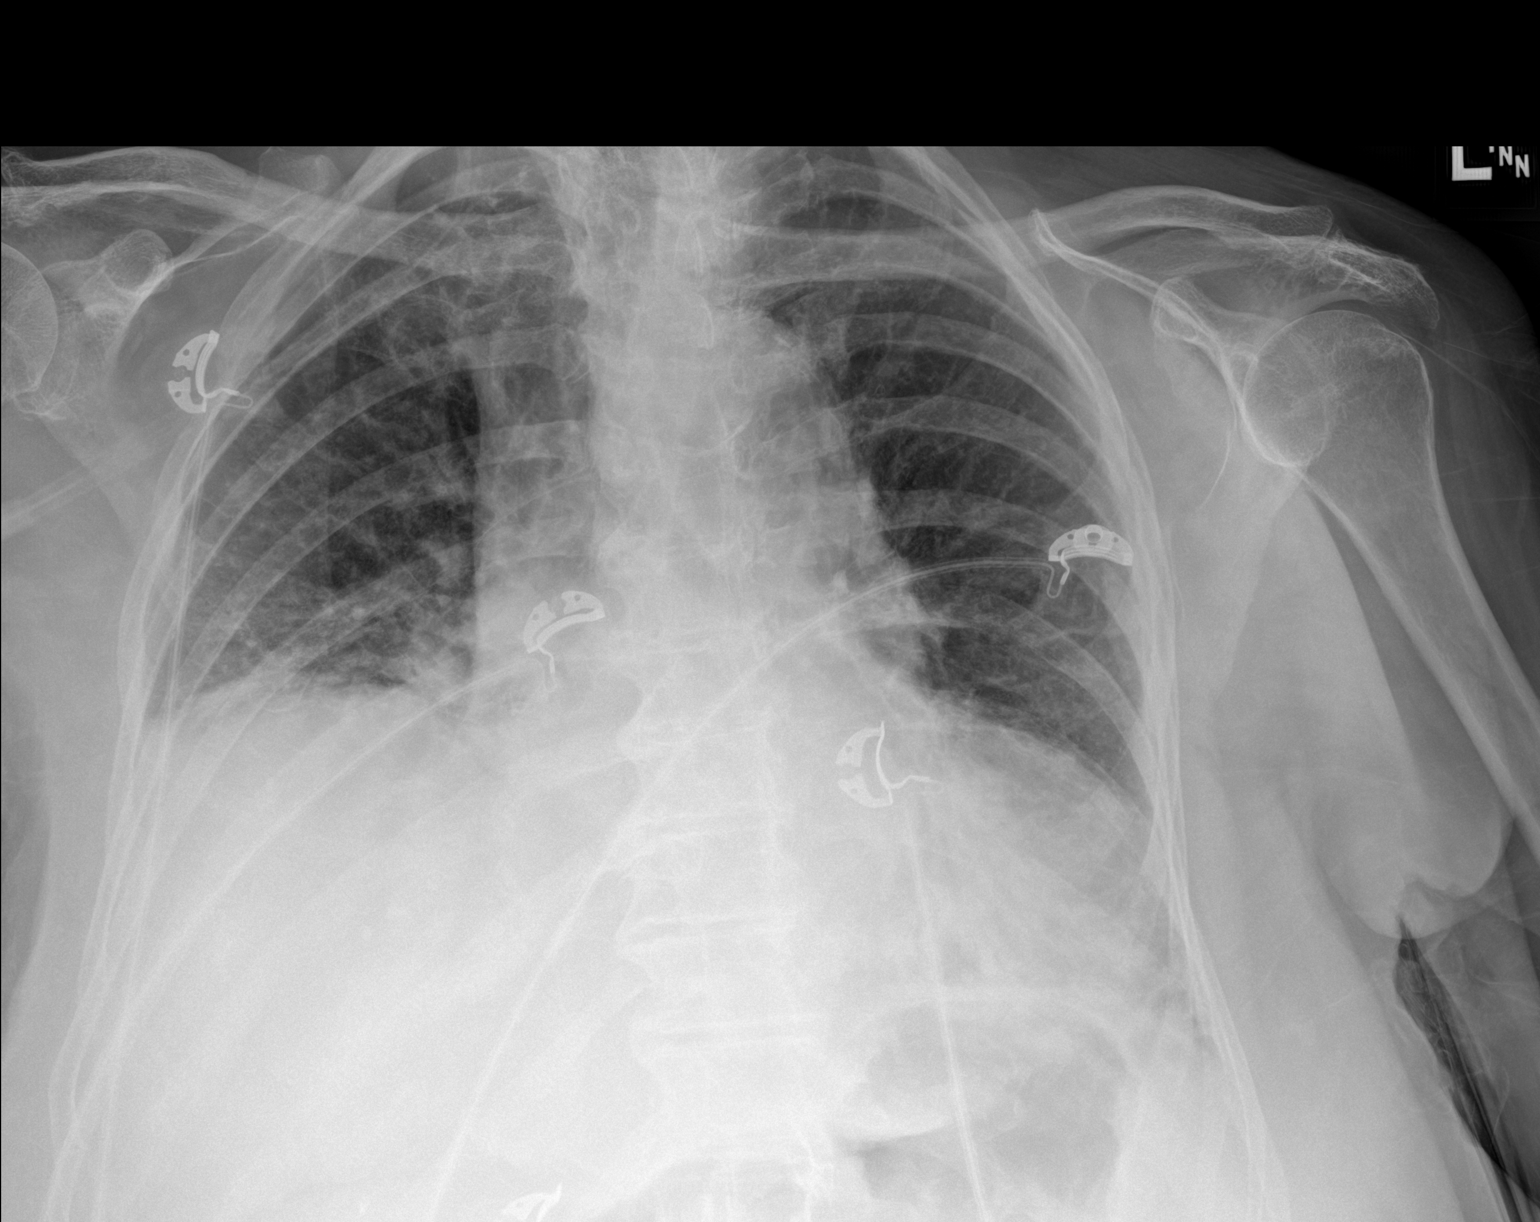

[1 of 1 positions shown; findings below may reference images not displayed]

FINDINGS: Mild cardiomegaly. Atherosclerotic calcification of the aortic arch.
Normal pulmonary vascularity. New elevation of the right
hemidiaphragm. Mild patchy bibasilar opacities. No pleural effusion
or pneumothorax. No acute osseous abnormality.
IMPRESSION: 1. Mild patchy bibasilar opacities, nonspecific, but concerning for
atypical infection, including potential viral pneumonia.
